# Patient Record
Sex: Male | Born: 1945 | Race: Black or African American | Hispanic: No | Marital: Married | Smoking: Former smoker
Health system: Southern US, Community
[De-identification: ages and names within clinical notes are randomized; demographics above are authoritative.]

## PROBLEM LIST (undated history)

## (undated) DIAGNOSIS — F4310 Post-traumatic stress disorder, unspecified: Secondary | ICD-10-CM

## (undated) DIAGNOSIS — I10 Essential (primary) hypertension: Secondary | ICD-10-CM

## (undated) DIAGNOSIS — E039 Hypothyroidism, unspecified: Secondary | ICD-10-CM

## (undated) DIAGNOSIS — C801 Malignant (primary) neoplasm, unspecified: Secondary | ICD-10-CM

## (undated) DIAGNOSIS — F319 Bipolar disorder, unspecified: Secondary | ICD-10-CM

## (undated) HISTORY — PX: GALLBLADDER SURGERY: SHX652

---

## 1998-11-09 ENCOUNTER — Ambulatory Visit (HOSPITAL_COMMUNITY): Admission: RE | Admit: 1998-11-09 | Discharge: 1998-11-09 | Payer: Self-pay | Admitting: Gastroenterology

## 2010-10-19 ENCOUNTER — Emergency Department (HOSPITAL_COMMUNITY): Admission: EM | Admit: 2010-10-19 | Discharge: 2010-10-19 | Payer: Self-pay | Admitting: Emergency Medicine

## 2010-11-05 ENCOUNTER — Emergency Department (HOSPITAL_COMMUNITY)
Admission: EM | Admit: 2010-11-05 | Discharge: 2010-11-05 | Payer: Self-pay | Source: Home / Self Care | Admitting: Emergency Medicine

## 2012-04-04 ENCOUNTER — Emergency Department (HOSPITAL_COMMUNITY)
Admission: EM | Admit: 2012-04-04 | Discharge: 2012-04-04 | Disposition: A | Discharge status: Home / Self Care | Payer: Medicare Other | Attending: Emergency Medicine | Admitting: Emergency Medicine

## 2012-04-04 ENCOUNTER — Encounter (HOSPITAL_COMMUNITY): Payer: Self-pay | Admitting: *Deleted

## 2012-04-04 DIAGNOSIS — R319 Hematuria, unspecified: Secondary | ICD-10-CM | POA: Insufficient documentation

## 2012-04-04 DIAGNOSIS — R300 Dysuria: Secondary | ICD-10-CM | POA: Insufficient documentation

## 2012-04-04 DIAGNOSIS — N390 Urinary tract infection, site not specified: Secondary | ICD-10-CM | POA: Insufficient documentation

## 2012-04-04 LAB — URINALYSIS, ROUTINE W REFLEX MICROSCOPIC
Ketones, ur: 40 mg/dL — AB
Nitrite: POSITIVE — AB
Protein, ur: >300 mg/dL — AB
pH: 5 (ref 5.0–8.0)

## 2012-04-04 LAB — DIFFERENTIAL
Basophils Absolute: 0.1 10*3/uL (ref 0.0–0.1)
Eosinophils Absolute: 0.2 10*3/uL (ref 0.0–0.7)
Lymphocytes Relative: 37 % (ref 12–46)
Neutrophils Relative %: 53 % (ref 43–77)

## 2012-04-04 LAB — POCT I-STAT, CHEM 8
Creatinine, Ser: 1.1 mg/dL (ref 0.50–1.35)
Glucose, Bld: 90 mg/dL (ref 70–99)
Hemoglobin: 15 g/dL (ref 13.0–17.0)
TCO2: 28 mmol/L (ref 0–100)

## 2012-04-04 LAB — CBC
Hemoglobin: 12.9 g/dL — ABNORMAL LOW (ref 13.0–17.0)
MCHC: 33.5 g/dL (ref 30.0–36.0)
RDW: 14.7 % (ref 11.5–15.5)

## 2012-04-04 LAB — URINE MICROSCOPIC-ADD ON

## 2012-04-04 MED ORDER — DEXTROSE 5 % IV SOLN
1.0000 g | Freq: Once | INTRAVENOUS | Status: AC
  Administered 2012-04-04: 1 g via INTRAVENOUS
  Filled 2012-04-04: qty 10

## 2012-04-04 MED ORDER — CEPHALEXIN 500 MG PO CAPS: 500.0000 mg | ORAL_CAPSULE | Freq: Four times a day (QID) | ORAL | Status: AC

## 2012-04-04 NOTE — ED Notes (Signed)
Patient states that he was urinating and noticed blood in his urine.  He only noticed it once.  Patient denies any painful urination.

## 2012-04-04 NOTE — Discharge Instructions (Signed)
Hematuria Hematuria is the presence of blood in the urine. This condition can result from many problems. Some of these are:   Urinary infections.   Injuries.   Kidney stones.   Drug reactions.   Tumors.   Other diseases.  The treatment depends on the diagnosis. Further studies may be needed to find the cause even if the hematuria subsides on its own. An exam by an urologist may also be indicated. If you are bleeding heavily, or have prostate problems, clots can form in the bladder and block the passage of urine. This requires emergency treatment with a catheter and bladder irrigation.  Contact your doctor for follow-up care within the next 2-4 days. SEEK IMMEDIATE MEDICAL CARE IF: You develop a fever, abdominal pain, urinary blockage, vomiting, or any other serious problems. Document Released: 12/28/2004 Document Revised: 11/09/2011 Document Reviewed: 10/02/2008 ExitCare Patient Information 2012 [employer].Urinary Tract Infection Infections of the urinary tract can start in several places. A bladder infection (cystitis), a kidney infection (pyelonephritis), and a prostate infection (prostatitis) are different types of urinary tract infections (UTIs). They usually get better if treated with medicines (antibiotics) that kill germs. Take all the medicine until it is gone. You or your child may feel better in a few days, but TAKE ALL MEDICINE or the infection may not respond and may become more difficult to treat. HOME CARE INSTRUCTIONS   Drink enough water and fluids to keep the urine clear or pale yellow. Cranberry juice is especially recommended, in addition to large amounts of water.   Avoid caffeine, tea, and carbonated beverages. They tend to irritate the bladder.   Alcohol may irritate the prostate.   Only take over-the-counter or prescription medicines for pain, discomfort, or fever as directed by your caregiver.  To prevent further infections:  Empty the bladder often.  Avoid holding urine for long periods of time.   After a bowel movement, women should cleanse from front to back. Use each tissue only once.   Empty the bladder before and after sexual intercourse.  FINDING OUT THE RESULTS OF YOUR TEST Not all test results are available during your visit. If your or your child's test results are not back during the visit, make an appointment with your caregiver to find out the results. Do not assume everything is normal if you have not heard from your caregiver or the medical facility. It is important for you to follow up on all test results. SEEK MEDICAL CARE IF:   There is back pain.   Your baby is older than 3 months with a rectal temperature of 100.5 F (38.1 C) or higher for more than 1 day.   Your or your child's problems (symptoms) are no better in 3 days. Return sooner if you or your child is getting worse.  SEEK IMMEDIATE MEDICAL CARE IF:   There is severe back pain or lower abdominal pain.   You or your child develops chills.   You have a fever.   Your baby is older than 3 months with a rectal temperature of 102 F (38.9 C) or higher.   Your baby is 3 months old or younger with a rectal temperature of 100.4 F (38 C) or higher.   There is nausea or vomiting.   There is continued burning or discomfort with urination.  MAKE SURE YOU:   Understand these instructions.   Will watch your condition.   Will get help right away if you are not doing well or get worse.    Document Released: 08/30/2005 Document Revised: 11/09/2011 Document Reviewed: 04/04/2007 ExitCare Patient Information 2012 [employer]. 

## 2012-04-04 NOTE — ED Provider Notes (Signed)
History     CSN: [id number]  Arrival date & time 04/04/12  1918   First MD Initiated Contact with Patient 04/04/12 2040      Chief Complaint  Patient presents with  . Hematuria    (Consider location/radiation/quality/duration/timing/severity/associated sxs/prior treatment) Patient is a 66 y.o. male presenting with hematuria. The history is provided by the patient.  Hematuria This is a new problem. The current episode started today. The problem is unchanged. He describes the hematuria as gross hematuria. The hematuria occurs throughout his entire urinary stream. He reports no clotting in his urine stream. His pain is at a severity of 2/10. The pain is mild. He describes his urine color as dark red. Irritative symptoms do not include frequency or urgency. Obstructive symptoms include incomplete emptying. Obstructive symptoms do not include dribbling. Associated symptoms include dysuria. Pertinent negatives include no abdominal pain, flank pain, genital pain, nausea or vomiting. His sexual activity is non-contributory to the current illness. His past medical history is significant for BPH. There is no history of GU trauma, kidney stones, prostatitis or recent infection.    History reviewed. No pertinent past medical history.  History reviewed. No pertinent past surgical history.  History reviewed. No pertinent family history.  History  Substance Use Topics  . Smoking status: Current Everyday Smoker -- 0.5 packs/day    Types: Cigarettes  . Smokeless tobacco: Not on file  . Alcohol Use: Yes      Review of Systems  Gastrointestinal: Negative for nausea, vomiting and abdominal pain.  Genitourinary: Positive for dysuria, hematuria and incomplete emptying. Negative for urgency, frequency and flank pain.  All other systems reviewed and are negative.    Allergies  Review of patient's allergies indicates no known allergies.  Home Medications  No current outpatient prescriptions on  file.  BP 162/79  Pulse 50  Temp(Src) 98.8 F (37.1 C) (Oral)  Resp 18  SpO2 98%  Physical Exam  Nursing note and vitals reviewed. Constitutional: He is oriented to person, place, and time. He appears well-developed and well-nourished. No distress.  HENT:  Head: Normocephalic and atraumatic.  Mouth/Throat: Oropharynx is clear and moist.  Eyes: Conjunctivae and EOM are normal. Pupils are equal, round, and reactive to light.  Neck: Normal range of motion. Neck supple.  Cardiovascular: Normal rate, regular rhythm and intact distal pulses.   No murmur heard. Pulmonary/Chest: Effort normal and breath sounds normal. No respiratory distress. He has no wheezes. He has no rales.  Abdominal: Soft. He exhibits no distension. There is no tenderness. There is no rebound and no guarding.  Genitourinary:       Small amt of blood at the meatus  Musculoskeletal: Normal range of motion. He exhibits no edema and no tenderness.  Neurological: He is alert and oriented to person, place, and time.  Skin: Skin is warm and dry. No rash noted. No erythema.  Psychiatric: He has a normal mood and affect. His behavior is normal.    ED Course  Procedures (including critical care time)  Labs Reviewed  URINALYSIS, ROUTINE W REFLEX MICROSCOPIC - Abnormal; Notable for the following:    Color, Urine RED (*) BIOCHEMICALS MAY BE AFFECTED BY COLOR   APPearance TURBID (*)    Glucose, UA 100 (*)    Hgb urine dipstick LARGE (*)    Bilirubin Urine LARGE (*)    Ketones, ur 40 (*)    Protein, ur >300 (*)    Nitrite POSITIVE (*)    Leukocytes, UA LARGE (*)  All other components within normal limits  CBC - Abnormal; Notable for the following:    Hemoglobin 12.9 (*)    HCT 38.5 (*)    MCV 73.3 (*)    MCH 24.6 (*)    All other components within normal limits  URINE MICROSCOPIC-ADD ON - Abnormal; Notable for the following:    Bacteria, UA MANY (*)    All other components within normal limits  DIFFERENTIAL    POCT I-STAT, CHEM 8   No results found.   No diagnosis found.    MDM   Patient presenting with a history of dysuria and hematuria that started today. Patient has no pain and is otherwise well appearing. He denies any other symptoms suggestive of pyelonephritis, kidney stone. He denies any symptoms concerning for anemia.  Vital signs are within normal limits. patient's bladder was irrigated and after 1 L of fluid patient's urine was light pink. There is no blood clots present. UA showed urinary tract infection with a stable hemoglobin and normal creatinine. Patient given antibiotics and the culture was sent. He will follow up with his doctor as an outpatient or return if symptoms worsen.        Gwyneth Sprout, MD 04/04/12 2214

## 2013-12-18 ENCOUNTER — Encounter (HOSPITAL_COMMUNITY): Payer: Self-pay | Admitting: Emergency Medicine

## 2013-12-18 ENCOUNTER — Emergency Department (HOSPITAL_COMMUNITY): Payer: Medicare Other

## 2013-12-18 ENCOUNTER — Emergency Department (HOSPITAL_COMMUNITY)
Admission: EM | Admit: 2013-12-18 | Discharge: 2013-12-19 | Disposition: A | Discharge status: Home / Self Care | Payer: Medicare Other | Attending: Emergency Medicine | Admitting: Emergency Medicine

## 2013-12-18 DIAGNOSIS — N390 Urinary tract infection, site not specified: Secondary | ICD-10-CM | POA: Insufficient documentation

## 2013-12-18 DIAGNOSIS — N3289 Other specified disorders of bladder: Secondary | ICD-10-CM | POA: Insufficient documentation

## 2013-12-18 DIAGNOSIS — E119 Type 2 diabetes mellitus without complications: Secondary | ICD-10-CM | POA: Insufficient documentation

## 2013-12-18 DIAGNOSIS — F17200 Nicotine dependence, unspecified, uncomplicated: Secondary | ICD-10-CM | POA: Insufficient documentation

## 2013-12-18 DIAGNOSIS — Z79899 Other long term (current) drug therapy: Secondary | ICD-10-CM | POA: Insufficient documentation

## 2013-12-18 LAB — CBC WITH DIFFERENTIAL/PLATELET
BASOS PCT: 0 % (ref 0–1)
Basophils Absolute: 0 10*3/uL (ref 0.0–0.1)
EOS ABS: 0.2 10*3/uL (ref 0.0–0.7)
EOS PCT: 3 % (ref 0–5)
HCT: 30.6 % — ABNORMAL LOW (ref 39.0–52.0)
HEMOGLOBIN: 10.5 g/dL — AB (ref 13.0–17.0)
LYMPHS ABS: 2.3 10*3/uL (ref 0.7–4.0)
Lymphocytes Relative: 36 % (ref 12–46)
MCH: 24.2 pg — AB (ref 26.0–34.0)
MCHC: 34.3 g/dL (ref 30.0–36.0)
MCV: 70.5 fL — AB (ref 78.0–100.0)
MONO ABS: 0.4 10*3/uL (ref 0.1–1.0)
Monocytes Relative: 6 % (ref 3–12)
Neutro Abs: 3.5 10*3/uL (ref 1.7–7.7)
Neutrophils Relative %: 55 % (ref 43–77)
PLATELETS: 144 10*3/uL — AB (ref 150–400)
RBC: 4.34 MIL/uL (ref 4.22–5.81)
RDW: 13.9 % (ref 11.5–15.5)
WBC: 6.4 10*3/uL (ref 4.0–10.5)

## 2013-12-18 LAB — URINE MICROSCOPIC-ADD ON

## 2013-12-18 LAB — URINALYSIS, ROUTINE W REFLEX MICROSCOPIC
GLUCOSE, UA: 100 mg/dL — AB
KETONES UR: 40 mg/dL — AB
NITRITE: POSITIVE — AB
PH: 5.5 (ref 5.0–8.0)
Protein, ur: >300 mg/dL — AB
Specific Gravity, Urine: 1.031 — ABNORMAL HIGH (ref 1.005–1.030)
Urobilinogen, UA: 2 mg/dL — ABNORMAL HIGH (ref 0.0–1.0)

## 2013-12-18 LAB — BASIC METABOLIC PANEL
BUN: 15 mg/dL (ref 6–23)
CALCIUM: 8.8 mg/dL (ref 8.4–10.5)
CO2: 22 meq/L (ref 19–32)
CREATININE: 0.88 mg/dL (ref 0.50–1.35)
Chloride: 101 mEq/L (ref 96–112)
GFR calc Af Amer: >90 mL/min (ref >90)
GFR, EST NON AFRICAN AMERICAN: 88 mL/min — AB (ref >90)
GLUCOSE: 78 mg/dL (ref 70–99)
Potassium: 4 mEq/L (ref 3.7–5.3)
Sodium: 137 mEq/L (ref 137–147)

## 2013-12-18 MED ORDER — SULFAMETHOXAZOLE-TMP DS 800-160 MG PO TABS
1.0000 | ORAL_TABLET | Freq: Once | ORAL | Status: AC
  Administered 2013-12-18: 1 via ORAL
  Filled 2013-12-18: qty 1

## 2013-12-18 NOTE — ED Notes (Signed)
Foley irrigated with 1000 ml of NS. Output was originally dark red, after 1000 ml output is now a clear pink with occasional clots. 50 mls of output originally when Foley inserted. 1000 mls of output from irrigation. Pt denied cramping or pain.

## 2013-12-18 NOTE — ED Notes (Signed)
Pt stats he has been having blood in his urine. Pt denies kidney stones. Pt states last time he was here (unsure of date) he was told he has a kidney infection.

## 2013-12-18 NOTE — ED Provider Notes (Signed)
CSN: [id number]     Arrival date & time 12/18/13  1548 History   First MD Initiated Contact with Patient 12/18/13 2056     Chief Complaint  Patient presents with  . Hematuria   (Consider location/radiation/quality/duration/timing/severity/associated sxs/prior Treatment) HPI Comments: Patient is a 68 year old male with a past medical history of diabetes who presents with hematuria that he noticed since last night. He notices the hematuria every time he urinates. Patient reports some associated left sided back pain when he urinates. The pain is aching and moderate and does not radiate. Patient reports last time this happened, he had a UTI. Patient denies any other associated symptoms. No aggravating/alleviating factors.    History reviewed. No pertinent past medical history. History reviewed. No pertinent past surgical history. History reviewed. No pertinent family history. History  Substance Use Topics  . Smoking status: Current Every Day Smoker -- 0.50 packs/day    Types: Cigarettes  . Smokeless tobacco: Not on file  . Alcohol Use: Yes    Review of Systems  Constitutional: Negative for fever, chills and fatigue.  HENT: Negative for trouble swallowing.   Eyes: Negative for visual disturbance.  Respiratory: Negative for shortness of breath.   Cardiovascular: Negative for chest pain and palpitations.  Gastrointestinal: Negative for nausea, vomiting, abdominal pain and diarrhea.  Genitourinary: Positive for hematuria. Negative for dysuria and difficulty urinating.  Musculoskeletal: Positive for back pain. Negative for arthralgias and neck pain.  Skin: Negative for color change.  Neurological: Negative for dizziness and weakness.  Psychiatric/Behavioral: Negative for dysphoric mood.    Allergies  Review of patient's allergies indicates no known allergies.  Home Medications   Current Outpatient Rx  Name  Route  Sig  Dispense  Refill  . LEVOTHYROXINE SODIUM PO   Oral   Take 1  tablet by mouth daily.         Marland Kitchen MELATONIN PO   Oral   Take 1 tablet by mouth at bedtime.          BP 138/79  Pulse 54  Temp(Src) 98.4 F (36.9 C) (Oral)  Resp 18  SpO2 98% Physical Exam  Nursing note and vitals reviewed. Constitutional: He is oriented to person, place, and time. He appears well-developed and well-nourished. No distress.  HENT:  Head: Normocephalic and atraumatic.  Eyes: Conjunctivae are normal.  Neck: Normal range of motion.  Cardiovascular: Normal rate and regular rhythm.  Exam reveals no gallop and no friction rub.   No murmur heard. Pulmonary/Chest: Effort normal and breath sounds normal. He has no wheezes. He has no rales. He exhibits no tenderness.  Abdominal: Soft. He exhibits no distension. There is no tenderness. There is no rebound.  Genitourinary:  No CVA tenderness to palpation.   Musculoskeletal: Normal range of motion.  Neurological: He is alert and oriented to person, place, and time. Coordination normal.  Speech is goal-oriented. Moves limbs without ataxia.   Skin: Skin is warm and dry.  Psychiatric: He has a normal mood and affect. His behavior is normal.    ED Course  Procedures (including critical care time) Labs Review Labs Reviewed  URINALYSIS, ROUTINE W REFLEX MICROSCOPIC - Abnormal; Notable for the following:    Color, Urine RED (*)    APPearance TURBID (*)    Specific Gravity, Urine 1.031 (*)    Glucose, UA 100 (*)    Hgb urine dipstick LARGE (*)    Bilirubin Urine LARGE (*)    Ketones, ur 40 (*)  Protein, ur >300 (*)    Urobilinogen, UA 2.0 (*)    Nitrite POSITIVE (*)    Leukocytes, UA LARGE (*)    All other components within normal limits  URINE MICROSCOPIC-ADD ON - Abnormal; Notable for the following:    Squamous Epithelial / LPF FEW (*)    All other components within normal limits  CBC WITH DIFFERENTIAL - Abnormal; Notable for the following:    Hemoglobin 10.5 (*)    HCT 30.6 (*)    MCV 70.5 (*)    MCH 24.2  (*)    Platelets 144 (*)    All other components within normal limits  BASIC METABOLIC PANEL - Abnormal; Notable for the following:    GFR calc non Af Amer 88 (*)    All other components within normal limits   Imaging Review Ct Abdomen Pelvis Wo Contrast  12/19/2013   CLINICAL DATA:  Hematuria. Left-sided low back pain during urination.  EXAM: CT ABDOMEN AND PELVIS WITHOUT CONTRAST  TECHNIQUE: Multidetector CT imaging of the abdomen and pelvis was performed following the standard protocol without intravenous contrast.  COMPARISON:  None.  FINDINGS: Large blood clot in the urinary bladder which is decompressed by Foley catheter. The Foley catheter accounts for the gas in the bladder. No discrete bladder mass identified, though the clock could certainly obscure a mass on the right side of the bladder. Moderate prostate gland enlargement, particularly the median lobe. Seminal vesicles normal.  No evidence of urinary tract calculi or obstruction on either side. Cortical calcifications involving the medial left kidney. Coarse calcifications involving a 1.9 x 3.2 x 2.4 cm cyst in the lower pole of the right kidney. 1.2 cm simple cyst involving the mid left kidney. Within the limits of the unenhanced technique, no solid renal masses.  Normal unenhanced appearance of the liver, spleen, pancreas, adrenal glands, and gallbladder. No biliary ductal dilation. 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  Stomach decompressed and unremarkable. Normal-appearing small bowel. Moderate stool burden throughout the normal appearing colon. Appendix not visualized, but no pericecal inflammation. No ascites.  Bone window images demonstrate mild degenerative disc disease at L4-5, mild facet degenerative changes involving the lower lumbar spine. Visualized lung bases clear apart from the expected dependent atelectasis posteriorly. Heart size upper normal.  IMPRESSION: 1. Large blood clot within the urinary  bladder. While a discrete bladder mass is not identified, the clot could certainly obscure a mass involving the right side of the bladder. 2. No evidence of urinary tract calculi. Or obstruction on either side. 3. Cortical calcifications involving the medial left kidney. 4. Coarse calcifications involving the wall of a cyst in the lower pole of the right kidney.   Electronically Signed   By: Evangeline Dakin M.D.   On: 12/19/2013 00:16    EKG Interpretation   None       MDM   1. UTI (urinary tract infection)   2. Blood clot in bladder     10:42 PM Labs pending. Urinalysis shows UTI. Patient will have CT abdomen pelvis to rule out kidney stone. Vitals stable and patient afebrile. Patient will have bactrim for UTI.   12:40 AM Patient has blood clot in bladder shown on CT scan. Patient will have bladder irrigated again and be discharged with bactrim for UTI. Patient will have close follow up with Urology. Vitals stable and patient afebrile.     Alvina Chou, [place] 12/19/13 [phone]

## 2013-12-18 NOTE — ED Notes (Signed)
Pt to CT. When patient returns Mingo Amber, MD stated to continue to irrigate until light pink or clear.

## 2013-12-18 NOTE — ED Notes (Signed)
Pt reports blood in urine since last night, having left side lower back pain when he urinates. Ambulatory, no acute distress noted.

## 2013-12-19 MED ORDER — CYCLOBENZAPRINE HCL 10 MG PO TABS: 10.0000 mg | ORAL_TABLET | Freq: Two times a day (BID) | ORAL | Status: DC | PRN

## 2013-12-19 MED ORDER — SULFAMETHOXAZOLE-TRIMETHOPRIM 800-160 MG PO TABS: 1.0000 | ORAL_TABLET | Freq: Two times a day (BID) | ORAL | Status: DC

## 2013-12-19 NOTE — Discharge Instructions (Signed)
Follow up with [facility] as soon as possible. Take Bactrim as directed until gone. Take Flexeril as needed for back pain. Return to the ED with worsening or concerning symptoms. Refer to attached documents for more information.

## 2013-12-19 NOTE — ED Provider Notes (Signed)
Medical screening examination/treatment/procedure(s) were conducted as a shared visit with non-physician practitioner(s) and myself.  I personally evaluated the patient during the encounter.  EKG Interpretation   None        Patient here with hematuria. UA shows UTI. Had this once previously, did not f/u with Urology. Belly soft, non-distended. Will CT as he had this once before. Foley irrigated - clots and blood removed. Irrigated again after CT scan. CT shows clots in bladder, unable to determine if mass present. Given Urology f/u.  Osvaldo Shipper, MD 12/19/13 [phone]

## 2013-12-19 NOTE — ED Notes (Signed)
Irrigated Foley with 250cc with not change in color of output; still bright red with some blood clots; will Irrigate again in about 10-15 mins

## 2014-05-08 ENCOUNTER — Encounter (HOSPITAL_COMMUNITY): Payer: Self-pay | Admitting: Emergency Medicine

## 2014-05-08 ENCOUNTER — Emergency Department (HOSPITAL_COMMUNITY)
Admission: EM | Admit: 2014-05-08 | Discharge: 2014-05-08 | Disposition: A | Discharge status: Home / Self Care | Payer: Medicare Other | Attending: Emergency Medicine | Admitting: Emergency Medicine

## 2014-05-08 ENCOUNTER — Emergency Department (HOSPITAL_COMMUNITY): Payer: Medicare Other

## 2014-05-08 DIAGNOSIS — Z79899 Other long term (current) drug therapy: Secondary | ICD-10-CM | POA: Insufficient documentation

## 2014-05-08 DIAGNOSIS — R091 Pleurisy: Secondary | ICD-10-CM | POA: Insufficient documentation

## 2014-05-08 DIAGNOSIS — F17200 Nicotine dependence, unspecified, uncomplicated: Secondary | ICD-10-CM | POA: Insufficient documentation

## 2014-05-08 DIAGNOSIS — Z7982 Long term (current) use of aspirin: Secondary | ICD-10-CM | POA: Insufficient documentation

## 2014-05-08 LAB — I-STAT TROPONIN, ED: TROPONIN I, POC: 0 ng/mL (ref 0.00–0.08)

## 2014-05-08 LAB — BASIC METABOLIC PANEL
BUN: 16 mg/dL (ref 6–23)
CALCIUM: 9.2 mg/dL (ref 8.4–10.5)
CO2: 25 meq/L (ref 19–32)
Chloride: 104 mEq/L (ref 96–112)
Creatinine, Ser: 1.1 mg/dL (ref 0.50–1.35)
GFR calc Af Amer: 79 mL/min — ABNORMAL LOW (ref >90)
GFR calc non Af Amer: 68 mL/min — ABNORMAL LOW (ref >90)
Glucose, Bld: 96 mg/dL (ref 70–99)
POTASSIUM: 3.8 meq/L (ref 3.7–5.3)
SODIUM: 142 meq/L (ref 137–147)

## 2014-05-08 LAB — CBC
HCT: 37.4 % — ABNORMAL LOW (ref 39.0–52.0)
Hemoglobin: 12.6 g/dL — ABNORMAL LOW (ref 13.0–17.0)
MCH: 22.4 pg — ABNORMAL LOW (ref 26.0–34.0)
MCHC: 33.7 g/dL (ref 30.0–36.0)
MCV: 66.4 fL — ABNORMAL LOW (ref 78.0–100.0)
PLATELETS: 190 10*3/uL (ref 150–400)
RBC: 5.63 MIL/uL (ref 4.22–5.81)
RDW: 16.7 % — ABNORMAL HIGH (ref 11.5–15.5)
WBC: 6.7 10*3/uL (ref 4.0–10.5)

## 2014-05-08 LAB — PRO B NATRIURETIC PEPTIDE: PRO B NATRI PEPTIDE: 109.3 pg/mL (ref 0–125)

## 2014-05-08 MED ORDER — HYDROCODONE-ACETAMINOPHEN 5-325 MG PO TABS: 1.0000 | ORAL_TABLET | ORAL | Status: DC | PRN

## 2014-05-08 MED ORDER — SODIUM CHLORIDE 0.9 % IV SOLN
1000.0000 mL | INTRAVENOUS | Status: DC
  Administered 2014-05-08: 1000 mL via INTRAVENOUS

## 2014-05-08 MED ORDER — NAPROXEN 500 MG PO TABS: 500.0000 mg | ORAL_TABLET | Freq: Two times a day (BID) | ORAL | Status: DC

## 2014-05-08 MED ORDER — ASPIRIN 81 MG PO CHEW
324.0000 mg | CHEWABLE_TABLET | Freq: Once | ORAL | Status: AC
  Administered 2014-05-08: 324 mg via ORAL
  Filled 2014-05-08: qty 4

## 2014-05-08 MED ORDER — HYDROCODONE-ACETAMINOPHEN 5-325 MG PO TABS
1.0000 | ORAL_TABLET | Freq: Once | ORAL | Status: AC
  Administered 2014-05-08: 1 via ORAL
  Filled 2014-05-08: qty 1

## 2014-05-08 NOTE — ED Provider Notes (Signed)
CSN: [id number]     Arrival date & time 05/08/14  1445 History   First MD Initiated Contact with Patient 05/08/14 1459     Chief Complaint  Patient presents with  . Chest Pain  . Shortness of Breath   HPI Patient presents to the emergency room with complaints of left-sided chest pain. The symptoms started yesterday. It is sharp pain in the left side of his chest it shoots towards his back. Pain seems to come and go with breathing. The episodes only last a few seconds at a time. He denies feeling short of breath to me. He denies fever or significant coughing. He denies any leg swelling. Denies any abdominal pain, vomiting or diarrhea. Patient denies any history of heart or lung disease. He does smoke cigarettes.  History reviewed. No pertinent past medical history. History reviewed. No pertinent past surgical history. History reviewed. No pertinent family history. History  Substance Use Topics  . Smoking status: Current Every Day Smoker -- 0.50 packs/day    Types: Cigarettes  . Smokeless tobacco: Not on file  . Alcohol Use: Yes    Review of Systems  All other systems reviewed and are negative.     Allergies  Review of patient's allergies indicates no known allergies.  Home Medications   Prior to Admission medications   Medication Sig Start Date End Date Taking? Authorizing Provider  aspirin EC 81 MG tablet Take 81 mg by mouth daily.   Yes Historical Provider, MD  LEVOTHYROXINE SODIUM PO Take 1 tablet by mouth daily.   Yes Historical Provider, MD  HYDROcodone-acetaminophen (NORCO/VICODIN) 5-325 MG per tablet Take 1-2 tablets by mouth every 4 (four) hours as needed. 05/08/14   Dorie Rank, MD  MELATONIN PO Take 1 tablet by mouth at bedtime.    Historical Provider, MD  naproxen (NAPROSYN) 500 MG tablet Take 1 tablet (500 mg total) by mouth 2 (two) times daily with a meal. As needed for pain 05/08/14   Dorie Rank, MD   BP 134/67  Pulse 51  Temp(Src) 98.6 F (37 C) (Oral)  Resp 16   SpO2 97% Physical Exam  Nursing note and vitals reviewed. Constitutional: He appears well-developed and well-nourished. No distress.  HENT:  Head: Normocephalic and atraumatic.  Right Ear: External ear normal.  Left Ear: External ear normal.  Eyes: Conjunctivae are normal. Right eye exhibits no discharge. Left eye exhibits no discharge. No scleral icterus.  Neck: Neck supple. No tracheal deviation present.  Cardiovascular: Normal rate, regular rhythm and intact distal pulses.   Pulmonary/Chest: Effort normal and breath sounds normal. No stridor. No respiratory distress. He has no wheezes. He has no rales. He exhibits tenderness.  Abdominal: Soft. Bowel sounds are normal. He exhibits no distension. There is no tenderness. There is no rebound and no guarding.  Musculoskeletal: He exhibits no edema and no tenderness.  Neurological: He is alert. He has normal strength. No cranial nerve deficit (no facial droop, extraocular movements intact, no slurred speech) or sensory deficit. He exhibits normal muscle tone. He displays no seizure activity. Coordination normal.  Skin: Skin is warm and dry. No rash noted. He is not diaphoretic.  Psychiatric: He has a normal mood and affect.    ED Course  Procedures (including critical care time) Labs Review Labs Reviewed  CBC - Abnormal; Notable for the following:    Hemoglobin 12.6 (*)    HCT 37.4 (*)    MCV 66.4 (*)    MCH 22.4 (*)  RDW 16.7 (*)    All other components within normal limits  BASIC METABOLIC PANEL - Abnormal; Notable for the following:    GFR calc non Af Amer 68 (*)    GFR calc Af Amer 79 (*)    All other components within normal limits  PRO B NATRIURETIC PEPTIDE  I-STAT TROPOININ, ED  I-STAT TROPOININ, ED    Imaging Review Dg Chest 2 View (if Patient Has Fever And/or Copd)  05/08/2014   CLINICAL DATA:  CHEST PAIN SHORTNESS OF BREATH  EXAM: CHEST  2 VIEW  COMPARISON:  Rib series dated 10/19/2010  FINDINGS: Lungs are  hyperinflated, with mild flattening of the hemidiaphragms. Cardiac silhouette and mediastinal contours are unremarkable. The lungs are clear. No acute osseous abnormalities.  IMPRESSION: No active cardiopulmonary disease.  COPD changes.   Electronically Signed   By: Margaree Mackintosh M.D.   On: 05/08/2014 15:57     EKG Interpretation   Date/Time:  Friday May 08 2014 14:51:00 EDT Ventricular Rate:  67 PR Interval:  160 QRS Duration: 84 QT Interval:  418 QTC Calculation: 441 R Axis:   68 Text Interpretation:  Sinus rhythm Probable left atrial enlargement No  previous tracing Confirmed by Emily Forse  MD-J, Verlisa Vara ([id number]) on 05/08/2014 3:07:21  PM      MDM   Final diagnoses:  Pleurisy    The patient describes chest pain occurs with deep breathing since yesterday. Nursing notes indicate the patient was feeling short of breath however the patient denied feeling short of breath when I asked him about it. He is not tachycardic. Not tachypneic. His laboratory testing and evaluation are unremarkable. I doubt acute coronary syndrome. Patient has no PE risk factors. He is low risk. I doubt pulmonary embolus.  At this time there does not appear to be any evidence of an acute emergency medical condition and the patient appears stable for discharge with appropriate outpatient follow up.   Patient was instructed to return to emergency room for worsening symptoms or any other concern    Dorie Rank, MD 05/08/14 1746

## 2014-05-08 NOTE — ED Notes (Signed)
Pt c/o central/left side chest pressure and SOB starting last night.  Pain score 8/10.  Sts pain is radiating into back.  Hx of smoking.  Pt reports having this pain in the past and diagnosed w/ PNA.

## 2014-05-08 NOTE — Discharge Instructions (Signed)
Pleurisy °Pleurisy is redness, puffiness (swelling), and soreness (inflammation) of the lining of the lungs. It can be hard to breathe and hurt to breathe. Coughing or deep breathing will make it hurt more. It is often caused by an existing infection or disease.  °HOME CARE °· Only take medicine as told by your doctor. °· Only take antibiotic medicine as directed. Make sure to finish it even if you start to feel better. °GET HELP RIGHT AWAY IF:  °· Your lips, fingernails, or toenails are blue or dark. °· You cough up blood. °· You have a hard time breathing. °· Your pain is not controlled with medicine or it lasts for more than 1 week. °· Your pain spreads (radiates) into your neck, arms, or jaw. °· You are short of breath or wheezing. °· You develop a fever, rash, throw up (vomit), or faint. °MAKE SURE YOU:  °· Understand these instructions. °· Will watch your condition. °· Will get help right away if you are not doing well or get worse. °Document Released: 11/02/2008 Document Revised: 07/23/2013 Document Reviewed: 05/04/2013 °ExitCare® Patient Information ©2014 [employer]. ° °

## 2015-02-25 ENCOUNTER — Emergency Department (HOSPITAL_COMMUNITY)
Admission: EM | Admit: 2015-02-25 | Discharge: 2015-02-25 | Disposition: A | Discharge status: Home / Self Care | Payer: Medicare Other | Attending: Emergency Medicine | Admitting: Emergency Medicine

## 2015-02-25 ENCOUNTER — Encounter (HOSPITAL_COMMUNITY): Payer: Self-pay | Admitting: *Deleted

## 2015-02-25 DIAGNOSIS — R454 Irritability and anger: Secondary | ICD-10-CM

## 2015-02-25 DIAGNOSIS — Z79899 Other long term (current) drug therapy: Secondary | ICD-10-CM | POA: Diagnosis not present

## 2015-02-25 DIAGNOSIS — Z7982 Long term (current) use of aspirin: Secondary | ICD-10-CM | POA: Insufficient documentation

## 2015-02-25 DIAGNOSIS — Z8659 Personal history of other mental and behavioral disorders: Secondary | ICD-10-CM | POA: Insufficient documentation

## 2015-02-25 DIAGNOSIS — Z791 Long term (current) use of non-steroidal anti-inflammatories (NSAID): Secondary | ICD-10-CM | POA: Diagnosis not present

## 2015-02-25 DIAGNOSIS — Z630 Problems in relationship with spouse or partner: Secondary | ICD-10-CM

## 2015-02-25 DIAGNOSIS — Z87891 Personal history of nicotine dependence: Secondary | ICD-10-CM | POA: Diagnosis not present

## 2015-02-25 HISTORY — DX: Bipolar disorder, unspecified: F31.9

## 2015-02-25 HISTORY — DX: Post-traumatic stress disorder, unspecified: F43.10

## 2015-02-25 NOTE — Discharge Instructions (Signed)
Stress and Stress Management °Stress is a normal reaction to life events. It is what you feel when life demands more than you are used to or more than you can handle. Some stress can be useful. For example, the stress reaction can help you catch the last bus of the day, study for a test, or meet a deadline at work. But stress that occurs too often or for too long can cause problems. It can affect your emotional health and interfere with relationships and normal daily activities. Too much stress can weaken your immune system and increase your risk for physical illness. If you already have a medical problem, stress can make it worse. °CAUSES  °All sorts of life events may cause stress. An event that causes stress for one person may not be stressful for another person. Major life events commonly cause stress. These may be positive or negative. Examples include losing your job, moving into a new home, getting married, having a baby, or losing a loved one. Less obvious life events may also cause stress, especially if they occur day after day or in combination. Examples include working long hours, driving in traffic, caring for children, being in debt, or being in a difficult relationship. °SIGNS AND SYMPTOMS °Stress may cause emotional symptoms including, the following: °· Anxiety. This is feeling worried, afraid, on edge, overwhelmed, or out of control. °· Anger. This is feeling irritated or impatient. °· Depression. This is feeling sad, down, helpless, or guilty. °· Difficulty focusing, remembering, or making decisions. °Stress may cause physical symptoms, including the following:  °· Aches and pains. These may affect your head, neck, back, stomach, or other areas of your body. °· Tight muscles or clenched jaw. °· Low energy or trouble sleeping.  °Stress may cause unhealthy behaviors, including the following:  °· Eating to feel better (overeating) or skipping meals. °· Sleeping too little, too much, or both. °· Working  too much or putting off tasks (procrastination). °· Smoking, drinking alcohol, or using drugs to feel better. °DIAGNOSIS  °Stress is diagnosed through an assessment by your health care provider. Your health care provider will ask questions about your symptoms and any stressful life events. Your health care provider will also ask about your medical history and may order blood tests or other tests. Certain medical conditions and medicine can cause physical symptoms similar to stress.  Mental illness can cause emotional symptoms and unhealthy behaviors similar to stress. Your health care provider may refer you to a mental health professional for further evaluation.  °TREATMENT  °Stress management is the recommended treatment for stress. The goals of stress management are reducing stressful life events and coping with stress in healthy ways.  °Techniques for reducing stressful life events include the following: °· Stress identification. Self-monitor for stress and identify what causes stress for you. These skills may help you to avoid some stressful events. °· Time management. Set your priorities, keep a calendar of events, and learn to say "no." These tools can help you avoid making too many commitments. °Techniques for coping with stress include the following: °· Rethinking the problem. Try to think realistically about stressful events rather than ignoring them or overreacting. Try to find the positives in a stressful situation rather than focusing on the negatives. °· Exercise. Physical exercise can release both physical and emotional tension. The key is to find a form of exercise you enjoy and do it regularly. °· Relaxation techniques. These relax the body and mind. Examples include yoga, meditation, tai chi, biofeedback, deep   breathing, progressive muscle relaxation, listening to music, being out in nature, journaling, and other hobbies. Again, the key is to find one or more that you enjoy and can do  regularly.  Healthy lifestyle. Eat a balanced diet, get plenty of sleep, and do not smoke. Avoid using alcohol or drugs to relax.  Strong support network. Spend time with family, friends, or other people you enjoy being around.Express your feelings and talk things over with someone you trust. Counseling or talktherapy with a mental health professional may be helpful if you are having difficulty managing stress on your own. Medicine is typically not recommended for the treatment of stress.Talk to your health care provider if you think you need medicine for symptoms of stress. HOME CARE INSTRUCTIONS  Keep all follow-up visits as directed by your health care provider.  Take all medicines as directed by your health care provider. SEEK MEDICAL CARE IF:  Your symptoms get worse or you start having new symptoms.  You feel overwhelmed by your problems and can no longer manage them on your own. SEEK IMMEDIATE MEDICAL CARE IF:  You feel like hurting yourself or someone else. Document Released: 05/16/2001 Document Revised: 04/06/2014 Document Reviewed: 07/15/2013 [facility] Patient Information 2015 [place], [place]. This information is not intended to replace advice given to you by your health care provider. Make sure you discuss any questions you have with your health care provider.  Anger Management Anger is a normal human emotion. However, anger can range from mild irritation to rage. When your anger becomes harmful to yourself or others, it is unhealthy anger.  CAUSES  There are many reasons for unhealthy anger. Many people learn how to express anger from observing how their family expressed anger. In troubled, chaotic, or abusive families, anger can be expressed as rage or even violence. Children can grow up never learning how healthy anger can be expressed. Factors that contribute to unhealthy anger include:   Drug or alcohol abuse.  Post-traumatic stress disorder.  Traumatic brain  injury. COMPLICATIONS  People with unhealthy anger tend to overreact and retaliate against a real or imagined threat. The need to retaliate can turn into violence or verbal abuse against another person. Chronic anger can lead to health problems, such as hypertension, high blood pressure, and depression. TREATMENT  Exercising, relaxing, meditating, or writing out your feelings all can be beneficial in managing moderate anger. For unhealthy anger, the following methods may be used:  Cognitive-behavioral counseling (learning skills to change the thoughts that influence your mood).  Relaxation training.  Interpersonal counseling.  Assertive communication skills.  Medication. Document Released: 09/17/2007 Document Revised: 02/12/2012 Document Reviewed: 01/26/2011 [facility] Patient Information 2015 [place], [place]. This information is not intended to replace advice given to you by your health care provider. Make sure you discuss any questions you have with your health care provider.   Emergency Department Resource Guide 1) Find a Doctor and Pay Out of Pocket Although you won't have to find out who is covered by your insurance plan, it is a good idea to ask around and get recommendations. You will then need to call the office and see if the doctor you have chosen will accept you as a new patient and what types of options they offer for patients who are self-pay. Some doctors offer discounts or will set up payment plans for their patients who do not have insurance, but you will need to ask so you aren't surprised when you get to your appointment.  2) Contact Your  Local Health Department °Not all health departments have doctors that can see patients for sick visits, but many do, so it is worth a call to see if yours does. If you don't know where your local health department is, you can check in your phone book. The CDC also has a tool to help you locate your state's health department, and many state  websites also have listings of all of their local health departments. ° °3) Find a Walk-in Clinic °If your illness is not likely to be very severe or complicated, you may want to try a walk in clinic. These are popping up all over the country in pharmacies, drugstores, and shopping centers. They're usually staffed by nurse practitioners or physician assistants that have been trained to treat common illnesses and complaints. They're usually fairly quick and inexpensive. However, if you have serious medical issues or chronic medical problems, these are probably not your best option. ° °No Primary Care Doctor: °- Call Health Connect at  832-8000 - they can help you locate a primary care doctor that  accepts your insurance, provides certain services, etc. °- Physician Referral Service- 1-800-533-3463 ° °Chronic Pain Problems: °Organization         Address  Phone   Notes  °[facility]  (336) 297-2271 Patients need to be referred by their primary care doctor.  ° °Medication Assistance: °Organization         Address  Phone   Notes  °[place] [facility] [address] °[place], [place] [address] (336) 641-8030 --Must be a resident of [place] °-- Must have NO insurance coverage whatsoever (no Medicaid/ Medicare, etc.) °-- The pt. MUST have a primary care doctor that directs their care regularly and follows them in the community °  °[facility]  (866) 331-1348   °United Way  (888) 892-1162   ° °Agencies that provide inexpensive medical care: °Organization         Address  Phone   Notes  °[facility]  (336) 832-8035   °[facility]    (336) 832-7272   °[facility] [address] °[place], [place] [address] (336) 832-4777   °[facility] [address], °[place] (336) 271-4999   °[facility]    (336) 373-0678   °[facility]    (336) 272-1050   °[facility] ° [address], [place] Phone:  (336) 832-4444, Fax:  (336) 832-4440 Hours of Operation:  9 am - 6 pm, M-F.  Also accepts Medicaid/Medicare and self-pay.  °[facility] ° [address], [place] Phone: (336) 832-3150, Fax: (336) 832-3151. Hours of Operation:  8:30 am - 5:30 pm, M-F.  Also accepts Medicaid and self-pay.  °[facility] [place] [address], [place] Phone: (336) 878-6027   °[facility] [address], [place], [place] (336)723-1848, Ext. 123 Mondays & Thursdays: 7-9 AM.  First 15 patients are seen on a first come, first serve basis. °  ° °Medicaid-accepting [place] Providers: ° °Organization         Address  Phone   Notes  °[facility] [address], [place] (336) 641-2100 Also accepts self-pay patients.  °[facility] [address], [place] ° (336) 856-9996   °[facility] [address], [place] (336)   288-8857   °[facility] [address] [place] [address], [place] (336) 299-7000   °Veita Bland [address], [place]  ° (336) 373-1557 Only accepts Hand Access Medicaid patients after they have their name applied to their card.  ° °Self-Pay (no insurance) in [place]: ° °Organization         Address  Phone   Notes  °Sickle Cell Patients, [facility] [address], [place] (336) 832-1970   °[facility] [address], [place] (336) 832-4400   °[facility] ° [address], [place] (336) 992-4800   °[facility]/Dr. Osei-Bonsu ° [address] [place] [address], [place] or [address], [place] (336) [phone] Phone number for both [place] and [place] locations is the same.  °[facility] [address], [place] (336) 299-0000   °[facility] [address] [place]  [place] [address], [place] or [address] (336) 852-7530 °(336) 878-2260   °[facility] [address], [place] (336) 350-1642, phone; (336) 294-5005, fax Sees patients 1st and 3rd Saturday of every month.  Must not qualify for public or private insurance (i.e. Medicaid, Medicare, [place] Health Choice, Veterans' Benefits) • Household income should be no more than 200% of the poverty level •The clinic cannot treat you if you are pregnant or think you are pregnant • Sexually transmitted diseases are not treated at the clinic.  ° ° °Dental Care: °Organization         Address  Phone  Notes  °[place] [facility] [address], [place] (336) 641-6152 Accepts children up to age 21 who are enrolled in Medicaid or [place] Health Choice; pregnant women with a Medicaid card; and children who have applied for Medicaid or [place] Health Choice, but were declined, whose parents can pay a reduced fee at time of service.  °[facility] [place]  [address], [place] (336) 641-7733 Accepts children up to age 21 who are enrolled in Medicaid or [place] Health Choice; pregnant women with a Medicaid card; and children who have applied for Medicaid or [place] Health Choice, but were declined, whose parents can pay a reduced fee at time of service.  °[facility] ° [address], [place] (336) 641-4533 Patients are seen by appointment only. Walk-ins are not accepted. [facility] will see patients 18 years of age and older. °Monday - Tuesday (8am-5pm) °Most Wednesdays (8:30-5pm) °$30 per visit, cash only  °[facility] ° [address], [place] (336) [phone] Patients are seen by appointment only. Walk-ins are not accepted. [facility] will see patients 18 years of age and older. °One Wednesday Evening (Monthly: Volunteer Based).  $30 per visit, cash only  °[facility]  (919) 537-3737 for adults; Children under age 4, call Graduate Pediatric Dentistry at (919) [phone]. Children aged 4-14, please call (919) [phone] to request a pediatric application. ° Dental services are provided in all areas of dental care including fillings, crowns and bridges, complete and partial dentures, implants, gum treatment, root canals, and extractions. Preventive care is also provided. Treatment is provided to both adults and children. °Patients are selected via a lottery and there is often a waiting list. °  °[facility] [address], °[place] ° (336)   941-7408 [email or web address]   [facility] [address] [place] [address] [place], [place] [phone], Ext. 123 Second and Fourth Thursday of each month, opens at 6:30 AM; Clinic ends at 9 AM.  Patients are seen on a first-come first-served basis, and a limited number are seen during each clinic.   [facility]  [address] Hillard Danker [place], [place] [phone]   Eligibility Requirements You must have lived in [place], [place], or [place] counties for at least the last three months.   You cannot be eligible for state or federal sponsored [employer], including [employer], [place], or [employer].   You generally cannot be eligible for healthcare insurance through your employer.    How to apply: Eligibility screenings are held every Tuesday and Wednesday afternoon from 1:00 pm until 4:00 pm. You do not need an appointment for the interview!  [facility] [place] [address], [place], [place]   [place]  [place] Department  [place]  [phone]    Behavioral Health Resources in the Community: Intensive Outpatient Programs Organization         Address  Phone  Notes  [place] [place] [facility] [address], [place], [place]  [phone]   [facility] [address] [place], [place]   [facility] [address] [place], [place]   [place] [address],  [place], [place] or (515)864-2820   Substance Abuse Resources Organization         Address  Phone  Notes  [facility]  [phone]   [place]  530-037-6899   [facility] [place]   Chinita Pester  754-351-4612   Residential & Outpatient Substance Abuse Program  (985)616-7758   Psychological Services Organization         Address  Phone  Notes  [facility] [place]  [place]  [phone]   [place] [address], [place] or [phone]    Mobile Crisis Teams Organization         Address  Phone  Notes  [facility], Mobile Crisis Care Unit  (530) 336-4837   [facility]  [address] [place], [place]   [place] [address] [place] [phone]    Self-Help/Support Groups Organization         Address  Phone             Notes  [place] [place] - variety of support groups  [place] Call for more information  Narcotics Anonymous (NA), [facility] [address], [employer] [place]  2 meetings at this location   Special educational needs teacher         Address  Phone  Notes  [facility] [place],    [place]  [phone]   [facility]  [address], [place] [address], [place], [place]   [place], [place] [phone] Admissions: 8am-3pm M-F  [facility] [address]    [place], [place] [phone]   [facility] [address] Jadene Pierini [place], [place]   [facility] [place] [address] [place] [address]   [place], [place]   [facility] -  Intensive Outpatient [address], [place], [place] 336-852-3033   °[facility] [address]  °[place], [place] 1-877-615-2722 or 336-784-9470   °[facility] ([facility]) [address] [place], [place] 336-227-7417 Accepts Medicaid  °[facility] [address]  °[place] 1-800-659-3381 Substance Abuse/Addiction Treatment  ° °[place] Behavioral Health Resources °Organization         Address  Phone  Notes  °[facility]  (888) 581-9988   °Julie Brannon, PhD [address] [place], [place]   (336) 349-5553 or (336) 951-0000   °[facility]   [address] °[place], [place] (336) 349-4454   °[facility] [address], [place], [place] (336) 342-8316 Insurance/Medicaid/sponsorship through [facility]  °[facility] [address]                                    [place], [place] (336) 342-8316 Therapy/tele-psych/case  °[facility] [address]  ° [place],  (336) 349-2233    °Dr. Arfeen  (336) 349-4544   °[facility] [place]  [employer] [place] [facility] 1) [address], [place] °2) [address], [place] °3)  [address], [place] (336) 349-3220 °(336) 342-7768 ° °(336) 342-8140   °[place] Child Abuse Hotline (336) 342-1394 or (336) 342-3537 (After Hours)    ° °
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
## 2015-02-25 NOTE — ED Notes (Signed)
Pt ambulating independently w/ steady gait on d/c in no acute distress, A&Ox4.  

## 2015-02-25 NOTE — ED Notes (Signed)
Bed: RESB Expected date:  Expected time:  Means of arrival:  Comments: combative
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
## 2015-09-04 ENCOUNTER — Emergency Department (HOSPITAL_COMMUNITY)
Admission: EM | Admit: 2015-09-04 | Discharge: 2015-09-05 | Disposition: A | Discharge status: Home / Self Care | Payer: Medicare Other | Attending: Physician Assistant | Admitting: Physician Assistant

## 2015-09-04 ENCOUNTER — Encounter (HOSPITAL_COMMUNITY): Payer: Self-pay | Admitting: Emergency Medicine

## 2015-09-04 DIAGNOSIS — F319 Bipolar disorder, unspecified: Secondary | ICD-10-CM | POA: Diagnosis not present

## 2015-09-04 DIAGNOSIS — R110 Nausea: Secondary | ICD-10-CM | POA: Insufficient documentation

## 2015-09-04 DIAGNOSIS — Z720 Tobacco use: Secondary | ICD-10-CM | POA: Insufficient documentation

## 2015-09-04 DIAGNOSIS — R001 Bradycardia, unspecified: Secondary | ICD-10-CM | POA: Diagnosis not present

## 2015-09-04 DIAGNOSIS — Z7982 Long term (current) use of aspirin: Secondary | ICD-10-CM | POA: Diagnosis not present

## 2015-09-04 DIAGNOSIS — R55 Syncope and collapse: Secondary | ICD-10-CM | POA: Insufficient documentation

## 2015-09-04 DIAGNOSIS — R61 Generalized hyperhidrosis: Secondary | ICD-10-CM | POA: Insufficient documentation

## 2015-09-04 LAB — CBC
HCT: 34.8 % — ABNORMAL LOW (ref 39.0–52.0)
HEMOGLOBIN: 12 g/dL — AB (ref 13.0–17.0)
MCH: 24.4 pg — AB (ref 26.0–34.0)
MCHC: 34.5 g/dL (ref 30.0–36.0)
MCV: 70.7 fL — ABNORMAL LOW (ref 78.0–100.0)
Platelets: 160 10*3/uL (ref 150–400)
RBC: 4.92 MIL/uL (ref 4.22–5.81)
RDW: 16.3 % — ABNORMAL HIGH (ref 11.5–15.5)
WBC: 10.1 10*3/uL (ref 4.0–10.5)

## 2015-09-04 LAB — URINALYSIS, ROUTINE W REFLEX MICROSCOPIC
Bilirubin Urine: NEGATIVE
Glucose, UA: NEGATIVE mg/dL
Hgb urine dipstick: NEGATIVE
Ketones, ur: NEGATIVE mg/dL
LEUKOCYTES UA: NEGATIVE
Nitrite: NEGATIVE
PROTEIN: NEGATIVE mg/dL
Specific Gravity, Urine: 1.02 (ref 1.005–1.030)
Urobilinogen, UA: 1 mg/dL (ref 0.0–1.0)
pH: 6 (ref 5.0–8.0)

## 2015-09-04 LAB — BASIC METABOLIC PANEL
ANION GAP: 9 (ref 5–15)
BUN: 10 mg/dL (ref 6–20)
CHLORIDE: 102 mmol/L (ref 101–111)
CO2: 26 mmol/L (ref 22–32)
Calcium: 8.7 mg/dL — ABNORMAL LOW (ref 8.9–10.3)
Creatinine, Ser: 1.12 mg/dL (ref 0.61–1.24)
GFR calc non Af Amer: >60 mL/min (ref >60)
Glucose, Bld: 111 mg/dL — ABNORMAL HIGH (ref 65–99)
POTASSIUM: 3.1 mmol/L — AB (ref 3.5–5.1)
Sodium: 137 mmol/L (ref 135–145)

## 2015-09-04 LAB — TROPONIN I: Troponin I: <0.03 ng/mL (ref <0.031)

## 2015-09-04 LAB — CBG MONITORING, ED: GLUCOSE-CAPILLARY: 128 mg/dL — AB (ref 65–99)

## 2015-09-04 LAB — T4, FREE: Free T4: 0.78 ng/dL (ref 0.61–1.12)

## 2015-09-04 LAB — TSH: TSH: 7.262 u[IU]/mL — ABNORMAL HIGH (ref 0.350–4.500)

## 2015-09-04 NOTE — ED Provider Notes (Signed)
CSN: [id number]     Arrival date & time 09/04/15  2108 History   First MD Initiated Contact with Patient 09/04/15 2127     Chief Complaint  Patient presents with  . Near Syncope    The history is provided by the patient, medical records and the EMS personnel.     69 yo M with hx bipolar disorder, PTSD, hypothyroid presenting with pre syncopal episode. Onset: suddenly. Context: pt was sitting in chair at barber when sx began when he just started to stand up from chair. Described as feeling suddenly diaphoretic and nauseated. Alleviated with rest. No similar prior episodes. No reported recent illnesses, denies fevers, URI sx, headache or falls, focal weakness, slurred speech, vision changes. EMS reports pt was hypotensive to 70s/40s, normalized by arrival here.    Past Medical History  Diagnosis Date  . Bipolar 1 disorder ([place])   . PTSD (post-traumatic stress disorder)    Past Surgical History  Procedure Laterality Date  . Gallbladder surgery     History reviewed. No pertinent family history. Social History  Substance Use Topics  . Smoking status: Current Every Day Smoker -- 0.50 packs/day    Types: Cigarettes    Last Attempt to Quit: 09/03/2014  . Smokeless tobacco: None  . Alcohol Use: Yes     Comment: socially    Review of Systems  Constitutional: Positive for diaphoresis. Negative for fever.  HENT: Negative for rhinorrhea.   Eyes: Negative for visual disturbance.  Respiratory: Negative for shortness of breath.   Cardiovascular: Negative for chest pain.  Gastrointestinal: Positive for nausea. Negative for vomiting and abdominal pain.  Genitourinary: Negative for decreased urine volume.  Skin: Negative for rash.  Allergic/Immunologic: Negative for immunocompromised state.  Neurological: Positive for light-headedness. Negative for syncope (presyncopal only), speech difficulty, weakness and headaches.  Psychiatric/Behavioral: Negative for confusion.      Allergies   Review of patient's allergies indicates no known allergies.  Home Medications   Prior to Admission medications   Medication Sig Start Date End Date Taking? Authorizing Provider  aspirin EC 81 MG tablet Take 81 mg by mouth daily.    Historical Provider, MD  HYDROcodone-acetaminophen (NORCO/VICODIN) 5-325 MG per tablet Take 1-2 tablets by mouth every 4 (four) hours as needed. 05/08/14   Dorie Rank, MD  LEVOTHYROXINE SODIUM PO Take 1 tablet by mouth daily.    Historical Provider, MD  MELATONIN PO Take 1 tablet by mouth at bedtime.    Historical Provider, MD  naproxen (NAPROSYN) 500 MG tablet Take 1 tablet (500 mg total) by mouth 2 (two) times daily with a meal. As needed for pain 05/08/14   Dorie Rank, MD   BP 151/55 mmHg  Pulse 51  Temp(Src) 97.7 F (36.5 C) (Oral)  Resp 16  SpO2 97% Physical Exam  Constitutional: He is oriented to person, place, and time. He appears well-developed and well-nourished. No distress.  HENT:  Head: Normocephalic and atraumatic.  Eyes: Right eye exhibits no discharge. Left eye exhibits no discharge.  Neck: No tracheal deviation present.  Cardiovascular: Regular rhythm.  Bradycardia present.   Pulmonary/Chest: Effort normal and breath sounds normal. No respiratory distress.  Abdominal: Soft. He exhibits no distension. There is no tenderness.  Musculoskeletal: He exhibits no edema.  Neurological: He is alert and oriented to person, place, and time. He has normal strength and normal reflexes. No cranial nerve deficit or sensory deficit. He displays no seizure activity. Coordination and gait normal. GCS eye subscore is 4. GCS  verbal subscore is 5. GCS motor subscore is 6.  Skin: Skin is warm and dry.  Psychiatric: He has a normal mood and affect. His behavior is normal.    ED Course  Procedures (including critical care time) Labs Review Labs Reviewed  BASIC METABOLIC PANEL  CBC  URINALYSIS, ROUTINE W REFLEX MICROSCOPIC (NOT AT [facility])  CBG MONITORING, ED     Imaging Review No results found. I have personally reviewed and evaluated these images and lab results as part of my medical decision-making.   EKG Interpretation   Date/Time:  Saturday September 04 2015 21:15:20 EDT Ventricular Rate:  51 PR Interval:  179 QRS Duration: 80 QT Interval:  518 QTC Calculation: 477 R Axis:   53 Text Interpretation:  ED PHYSICIAN INTERPRETATION AVAILABLE IN [facility] Sinus rhythm Atrial premature complexes Borderline T  abnormalities, inferior leads Minimal ST elevation, anterior leads  Borderline prolonged QT interval Confirmed by TEST, Record ([id number]) on  09/05/2015 7:48:21 AM      MDM   Final diagnoses:  None    69 yo M with hx bipolar disorder, PTSD, hypothyroid presenting with pre syncopal episode. AF, VSS upon arrival, no longer hypotensive. Possible vasovagal vs orthostatic hypotension. However, concern for possible anginal equivalent in setting of cardiovascular risk factors and EKG as above. Trop neg x1. Glucose and electrolytes unremarkable. TSH elevated but free T4 within nl range, advised f/u with PCP regarding dosing of synthroid. No leukocytosis or significant anemia. No hx or exam findings suggestive of infectious etiology. Will obtain second troponin to rule out ACS. If neg, plan for discharge home with close PCP f/u and return precautions.   Care transferred to Dr. Doy Mince while awaiting second trop.   Case discussed with Dr. Thomasene Lot who oversaw management of this patient.     Ivin Booty, MD 09/05/15 7711  Courteney Julio Alm, MD 09/09/15 1622

## 2015-09-04 NOTE — ED Notes (Signed)
Pt presents with [facility] for near-syncope at the barber shop when changing positions from sitting to standing; bystanders report patient turned pale and diaphoretic; PTAR reports pt was hypotensive (70/40s); pt CAOx4 at this time; VSS

## 2015-09-05 LAB — I-STAT TROPONIN, ED: Troponin i, poc: 0 ng/mL (ref 0.00–0.08)

## 2015-09-05 NOTE — Discharge Instructions (Signed)
Near-Syncope Near-syncope (commonly known as near fainting) is sudden weakness, dizziness, or feeling like you might pass out. During an episode of near-syncope, you may also develop pale skin, have tunnel vision, or feel sick to your stomach (nauseous). Near-syncope may occur when getting up after sitting or while standing for a long time. It is caused by a sudden decrease in blood flow to the brain. This decrease can result from various causes or triggers, most of which are not serious. However, because near-syncope can sometimes be a sign of something serious, a medical evaluation is required. The specific cause is often not determined. HOME CARE INSTRUCTIONS  Monitor your condition for any changes. The following actions may help to alleviate any discomfort you are experiencing:  Have someone stay with you until you feel stable.  Lie down right away and prop your feet up if you start feeling like you might faint. Breathe deeply and steadily. Wait until all the symptoms have passed. Most of these episodes last only a few minutes. You may feel tired for several hours.   Drink enough fluids to keep your urine clear or pale yellow.   If you are taking blood pressure or heart medicine, get up slowly when seated or lying down. Take several minutes to sit and then stand. This can reduce dizziness.  Follow up with your health care provider as directed. SEEK IMMEDIATE MEDICAL CARE IF:   You have a severe headache.   You have unusual pain in the chest, abdomen, or back.   You are bleeding from the mouth or rectum, or you have black or tarry stool.   You have an irregular or very fast heartbeat.   You have repeated fainting or have seizure-like jerking during an episode.   You faint when sitting or lying down.   You have confusion.   You have difficulty walking.   You have severe weakness.   You have vision problems.  MAKE SURE YOU:   Understand these instructions.  Will  watch your condition.  Will get help right away if you are not doing well or get worse. Document Released: 11/20/2005 Document Revised: 11/25/2013 Document Reviewed: 04/25/2013 Exit[employer] Patient Information 2015 [employer]. This information is not intended to replace advice given to you by your health care provider. Make sure you discuss any questions you have with your health care provider.  

## 2015-09-05 NOTE — ED Provider Notes (Signed)
Care assumed from Dr. Thomasene Lot with plan to DC if delta troponin negative.  It is negative.    Clinical Impression: 1. Near syncope       Serita Grit, MD 09/05/15 [phone]

## 2015-11-06 ENCOUNTER — Emergency Department (HOSPITAL_COMMUNITY): Payer: No Typology Code available for payment source

## 2015-11-06 ENCOUNTER — Encounter (HOSPITAL_COMMUNITY): Payer: Self-pay | Admitting: Emergency Medicine

## 2015-11-06 ENCOUNTER — Emergency Department (HOSPITAL_COMMUNITY)
Admission: EM | Admit: 2015-11-06 | Discharge: 2015-11-06 | Disposition: A | Discharge status: Home / Self Care | Payer: No Typology Code available for payment source | Attending: Emergency Medicine | Admitting: Emergency Medicine

## 2015-11-06 DIAGNOSIS — F17210 Nicotine dependence, cigarettes, uncomplicated: Secondary | ICD-10-CM | POA: Insufficient documentation

## 2015-11-06 DIAGNOSIS — Z79899 Other long term (current) drug therapy: Secondary | ICD-10-CM | POA: Diagnosis not present

## 2015-11-06 DIAGNOSIS — Z7982 Long term (current) use of aspirin: Secondary | ICD-10-CM | POA: Diagnosis not present

## 2015-11-06 DIAGNOSIS — Z8659 Personal history of other mental and behavioral disorders: Secondary | ICD-10-CM | POA: Insufficient documentation

## 2015-11-06 DIAGNOSIS — Y9389 Activity, other specified: Secondary | ICD-10-CM | POA: Diagnosis not present

## 2015-11-06 DIAGNOSIS — Y998 Other external cause status: Secondary | ICD-10-CM | POA: Diagnosis not present

## 2015-11-06 DIAGNOSIS — M549 Dorsalgia, unspecified: Secondary | ICD-10-CM

## 2015-11-06 DIAGNOSIS — S3992XA Unspecified injury of lower back, initial encounter: Secondary | ICD-10-CM | POA: Diagnosis present

## 2015-11-06 DIAGNOSIS — Y92410 Unspecified street and highway as the place of occurrence of the external cause: Secondary | ICD-10-CM | POA: Insufficient documentation

## 2015-11-06 MED ORDER — NAPROXEN 500 MG PO TABS: 500.0000 mg | ORAL_TABLET | Freq: Two times a day (BID) | ORAL | Status: DC
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
## 2015-11-06 NOTE — Discharge Instructions (Signed)
Take the prescribed medication as directed. Follow-up with your primary care physician if you continue having issues. Return to the ED for new or worsening symptoms.

## 2016-09-09 ENCOUNTER — Emergency Department (HOSPITAL_COMMUNITY)
Admission: EM | Admit: 2016-09-09 | Discharge: 2016-09-10 | Disposition: A | Discharge status: Home / Self Care | Payer: Medicare Other | Attending: Emergency Medicine | Admitting: Emergency Medicine

## 2016-09-09 ENCOUNTER — Encounter (HOSPITAL_COMMUNITY): Payer: Self-pay | Admitting: Emergency Medicine

## 2016-09-09 DIAGNOSIS — F17210 Nicotine dependence, cigarettes, uncomplicated: Secondary | ICD-10-CM | POA: Diagnosis not present

## 2016-09-09 DIAGNOSIS — E860 Dehydration: Secondary | ICD-10-CM

## 2016-09-09 DIAGNOSIS — Z79899 Other long term (current) drug therapy: Secondary | ICD-10-CM | POA: Diagnosis not present

## 2016-09-09 DIAGNOSIS — Z7982 Long term (current) use of aspirin: Secondary | ICD-10-CM | POA: Diagnosis not present

## 2016-09-09 DIAGNOSIS — R531 Weakness: Secondary | ICD-10-CM

## 2016-09-09 DIAGNOSIS — E876 Hypokalemia: Secondary | ICD-10-CM | POA: Diagnosis not present

## 2016-09-09 DIAGNOSIS — R55 Syncope and collapse: Secondary | ICD-10-CM | POA: Diagnosis present

## 2016-09-09 MED ORDER — SODIUM CHLORIDE 0.9 % IV BOLUS (SEPSIS)
1000.0000 mL | Freq: Once | INTRAVENOUS | Status: AC
  Administered 2016-09-09: 1000 mL via INTRAVENOUS

## 2016-09-09 NOTE — ED Notes (Signed)
Bed: [id number] Expected date:  Expected time:  Means of arrival:  Comments: 70 yo M  Syncopal episode

## 2016-09-09 NOTE — ED Triage Notes (Signed)
Brought in by EMS from home with c/o near syncope.  Pt reported that he got dizzy while in the kitchen, bent over and was assisted by spouse when he felt he was going to pass out.  Pt denied complete loss of consciousness.  Pt reported that he has been working all day today and has had not time to eat anything.  Pt was given NS 1000 ml bolus by EMS.

## 2016-09-10 LAB — CBC
HCT: 31.6 % — ABNORMAL LOW (ref 39.0–52.0)
Hemoglobin: 10.7 g/dL — ABNORMAL LOW (ref 13.0–17.0)
MCH: 23.6 pg — ABNORMAL LOW (ref 26.0–34.0)
MCHC: 33.9 g/dL (ref 30.0–36.0)
MCV: 69.6 fL — ABNORMAL LOW (ref 78.0–100.0)
Platelets: 131 10*3/uL — ABNORMAL LOW (ref 150–400)
RBC: 4.54 MIL/uL (ref 4.22–5.81)
RDW: 15.4 % (ref 11.5–15.5)
WBC: 6.9 10*3/uL (ref 4.0–10.5)

## 2016-09-10 LAB — BASIC METABOLIC PANEL
Anion gap: 9 (ref 5–15)
BUN: 13 mg/dL (ref 6–20)
CALCIUM: 8.5 mg/dL — AB (ref 8.9–10.3)
CO2: 24 mmol/L (ref 22–32)
CREATININE: 1.05 mg/dL (ref 0.61–1.24)
Chloride: 107 mmol/L (ref 101–111)
GFR calc Af Amer: >60 mL/min (ref >60)
GLUCOSE: 89 mg/dL (ref 65–99)
Potassium: 2.9 mmol/L — ABNORMAL LOW (ref 3.5–5.1)
Sodium: 140 mmol/L (ref 135–145)

## 2016-09-10 MED ORDER — POTASSIUM CHLORIDE ER 10 MEQ PO TBCR: 10.0000 meq | EXTENDED_RELEASE_TABLET | Freq: Every day | ORAL | 0 refills | Status: DC

## 2016-09-10 MED ORDER — POTASSIUM CHLORIDE CRYS ER 20 MEQ PO TBCR
40.0000 meq | EXTENDED_RELEASE_TABLET | Freq: Once | ORAL | Status: AC
  Administered 2016-09-10: 40 meq via ORAL
  Filled 2016-09-10: qty 2

## 2016-09-10 NOTE — ED Provider Notes (Addendum)
[facility] DEPT Provider Note   CSN: [id number] Arrival date & time: 09/09/16  2322     History   Chief Complaint Chief Complaint  Patient presents with  . Near Syncope    HPI Rodney Romero is a 70 y.o. male.  HPI Patient states she stood up and felt lightheaded and weak and felt like he was going to pass out.  He denied preceding chest pain or palpitations.  He did not lose consciousness.  He denies abdominal pain.  He was given fluids by EMS and feels much better this time.  He reports decreased oral intake today.  Symptoms are moderate in severity   Past Medical History:  Diagnosis Date  . Bipolar 1 disorder ([place])   . PTSD (post-traumatic stress disorder)     There are no active problems to display for this patient.   Past Surgical History:  Procedure Laterality Date  . GALLBLADDER SURGERY         Home Medications    Prior to Admission medications   Medication Sig Start Date End Date Taking? Authorizing Provider  aspirin EC 81 MG tablet Take 81 mg by mouth daily.    Historical Provider, MD  Cyanocobalamin (VITAMIN B-12 PO) Take 1 tablet by mouth daily.    Historical Provider, MD  ferrous sulfate 325 (65 FE) MG tablet Take 325 mg by mouth 2 (two) times daily with a meal.    Historical Provider, MD  HYDROcodone-acetaminophen (NORCO/VICODIN) 5-325 MG per tablet Take 1-2 tablets by mouth every 4 (four) hours as needed. Patient not taking: Reported on 09/04/2015 05/08/14   Dorie Rank, MD  LEVOTHYROXINE SODIUM PO Take 1 tablet by mouth daily before breakfast.     Historical Provider, MD  MELATONIN PO Take 1 tablet by mouth at bedtime.    Historical Provider, MD  naproxen (NAPROSYN) 500 MG tablet Take 1 tablet (500 mg total) by mouth 2 (two) times daily with a meal. 11/06/15   Larene Pickett, PA-C  potassium chloride (K-DUR) 10 MEQ tablet Take 1 tablet (10 mEq total) by mouth daily. 09/10/16   Jola Schmidt, MD  PRESCRIPTION MEDICATION Take 1 tablet by mouth daily. For  memory    Historical Provider, MD    Family History History reviewed. No pertinent family history.  Social History Social History  Substance Use Topics  . Smoking status: Current Every Day Smoker    Packs/day: 0.00    Types: Cigarettes    Last attempt to quit: 09/03/2014  . Smokeless tobacco: Never Used  . Alcohol use Yes     Comment: socially     Allergies   Review of patient's allergies indicates no known allergies.   Review of Systems Review of Systems  All other systems reviewed and are negative.    Physical Exam Updated Vital Signs BP 140/78 (BP Location: Left Arm)   Pulse (!) 44   Temp 97.6 F (36.4 C) (Oral)   Resp 17   Ht 5\' 7"  (1.702 m)   Wt 135 lb (61.2 kg)   SpO2 99%   BMI 21.14 kg/m   Physical Exam  Constitutional: He is oriented to person, place, and time. He appears well-developed and well-nourished.  HENT:  Head: Normocephalic and atraumatic.  Eyes: EOM are normal.  Neck: Normal range of motion.  Cardiovascular: Normal rate, regular rhythm, normal heart sounds and intact distal pulses.   Pulmonary/Chest: Effort normal and breath sounds normal. No respiratory distress.  Abdominal: Soft. He exhibits no distension. There  is no tenderness.  Musculoskeletal: Normal range of motion.  Neurological: He is alert and oriented to person, place, and time.  Skin: Skin is warm and dry.  Psychiatric: He has a normal mood and affect. Judgment normal.  Nursing note and vitals reviewed.    ED Treatments / Results  Labs (all labs ordered are listed, but only abnormal results are displayed) Labs Reviewed  CBC - Abnormal; Notable for the following:       Result Value   Hemoglobin 10.7 (*)    HCT 31.6 (*)    MCV 69.6 (*)    MCH 23.6 (*)    Platelets 131 (*)    All other components within normal limits  BASIC METABOLIC PANEL - Abnormal; Notable for the following:    Potassium 2.9 (*)    Calcium 8.5 (*)    All other components within normal limits     EKG ECG interpretation   Date: 09/10/2016  Rate: 61  Rhythm: normal sinus rhythm  QRS Axis: normal  Intervals: normal  ST/T Wave abnormalities: normal  Conduction Disutrbances:   Narrative Interpretation: several PACs  Old EKG Reviewed: No significant changes noted     Radiology No results found.  Procedures Procedures (including critical care time)  Medications Ordered in ED Medications  potassium chloride SA (K-DUR,KLOR-CON) CR tablet 40 mEq (not administered)  sodium chloride 0.9 % bolus 1,000 mL (1,000 mLs Intravenous New Bag/Given 09/09/16 2359)     Initial Impression / Assessment and Plan / ED Course  I have reviewed the triage vital signs and the nursing notes.  Pertinent labs & imaging results that were available during my care of the patient were reviewed by me and considered in my medical decision making (see chart for details).  Clinical Course    Overall well-appearing.  Patient feels much better this time.  Suspect dehydration.  Patient hydrated in the ER.  Mild hypokalemia.  This will be repleted.  Discharge home in good condition.  Primary care follow-up.  He understands to return to the ER for new or worsening symptoms  Final Clinical Impressions(s) / ED Diagnoses   Final diagnoses:  Weakness  Dehydration  Hypokalemia    New Prescriptions New Prescriptions   POTASSIUM CHLORIDE (K-DUR) 10 MEQ TABLET    Take 1 tablet (10 mEq total) by mouth daily.     Jola Schmidt, MD 09/10/16 [place], MD 09/10/16 [phone]

## 2018-03-06 ENCOUNTER — Emergency Department (HOSPITAL_COMMUNITY): Payer: Medicare Other

## 2018-03-06 ENCOUNTER — Encounter (HOSPITAL_COMMUNITY): Payer: Self-pay | Admitting: Family Medicine

## 2018-03-06 DIAGNOSIS — R0789 Other chest pain: Secondary | ICD-10-CM | POA: Insufficient documentation

## 2018-03-06 DIAGNOSIS — F17210 Nicotine dependence, cigarettes, uncomplicated: Secondary | ICD-10-CM | POA: Insufficient documentation

## 2018-03-06 DIAGNOSIS — D509 Iron deficiency anemia, unspecified: Secondary | ICD-10-CM | POA: Insufficient documentation

## 2018-03-06 DIAGNOSIS — Z79899 Other long term (current) drug therapy: Secondary | ICD-10-CM | POA: Diagnosis not present

## 2018-03-06 DIAGNOSIS — Z7982 Long term (current) use of aspirin: Secondary | ICD-10-CM | POA: Diagnosis not present

## 2018-03-06 DIAGNOSIS — E039 Hypothyroidism, unspecified: Secondary | ICD-10-CM | POA: Diagnosis not present

## 2018-03-06 DIAGNOSIS — I10 Essential (primary) hypertension: Secondary | ICD-10-CM | POA: Diagnosis not present

## 2018-03-06 DIAGNOSIS — R079 Chest pain, unspecified: Secondary | ICD-10-CM | POA: Diagnosis present

## 2018-03-06 LAB — CBC
HEMATOCRIT: 37.1 % — AB (ref 39.0–52.0)
HEMOGLOBIN: 12.2 g/dL — AB (ref 13.0–17.0)
MCH: 23.6 pg — AB (ref 26.0–34.0)
MCHC: 32.9 g/dL (ref 30.0–36.0)
MCV: 71.6 fL — AB (ref 78.0–100.0)
PLATELETS: 153 10*3/uL (ref 150–400)
RBC: 5.18 MIL/uL (ref 4.22–5.81)
RDW: 14.9 % (ref 11.5–15.5)
WBC: 7.4 10*3/uL (ref 4.0–10.5)

## 2018-03-06 LAB — BASIC METABOLIC PANEL
ANION GAP: 8 (ref 5–15)
BUN: 11 mg/dL (ref 6–20)
CHLORIDE: 105 mmol/L (ref 101–111)
CO2: 28 mmol/L (ref 22–32)
Calcium: 9.1 mg/dL (ref 8.9–10.3)
Creatinine, Ser: 1.08 mg/dL (ref 0.61–1.24)
GFR calc non Af Amer: >60 mL/min (ref >60)
Glucose, Bld: 107 mg/dL — ABNORMAL HIGH (ref 65–99)
POTASSIUM: 4.2 mmol/L (ref 3.5–5.1)
SODIUM: 141 mmol/L (ref 135–145)

## 2018-03-06 LAB — I-STAT TROPONIN, ED: Troponin i, poc: 0 ng/mL (ref 0.00–0.08)

## 2018-03-06 NOTE — ED Triage Notes (Signed)
Patient reports he is experiencing right sided chest pain, thoraic pain, and dizziness. Symptoms started yesterday. Patient drove himself here with no family accompanying patient.

## 2018-03-07 ENCOUNTER — Emergency Department (HOSPITAL_COMMUNITY)
Admission: EM | Admit: 2018-03-07 | Discharge: 2018-03-07 | Disposition: A | Discharge status: Home / Self Care | Payer: Medicare Other | Attending: Emergency Medicine | Admitting: Emergency Medicine

## 2018-03-07 DIAGNOSIS — R0789 Other chest pain: Secondary | ICD-10-CM

## 2018-03-07 DIAGNOSIS — D509 Iron deficiency anemia, unspecified: Secondary | ICD-10-CM

## 2018-03-07 HISTORY — DX: Essential (primary) hypertension: I10

## 2018-03-07 HISTORY — DX: Hypothyroidism, unspecified: E03.9

## 2018-03-07 LAB — I-STAT TROPONIN, ED: TROPONIN I, POC: 0 ng/mL (ref 0.00–0.08)

## 2018-03-07 MED ORDER — NAPROXEN 500 MG PO TABS: 500.0000 mg | ORAL_TABLET | Freq: Two times a day (BID) | ORAL | 0 refills | Status: DC

## 2018-03-07 MED ORDER — IBUPROFEN 800 MG PO TABS
800.0000 mg | ORAL_TABLET | Freq: Once | ORAL | Status: AC
  Administered 2018-03-07: 800 mg via ORAL
  Filled 2018-03-07: qty 1

## 2018-03-07 NOTE — Discharge Instructions (Signed)
Return if symptoms are getting worse. °

## 2018-03-07 NOTE — ED Provider Notes (Signed)
[facility] DEPT Provider Note   CSN: [id number] Arrival date & time: 03/06/18  2003     History   Chief Complaint Chief Complaint  Patient presents with  . Chest Pain  . Back Pain  . Dizziness    HPI Rodney Romero is a 72 y.o. male.  The history is provided by the patient.  He has history of hypertension, bipolar disorder and comes in with chest pain for the last 36 hours.  Pain is right-sided and intermittent.  He states it feels like there is a balloon inside him him.  He denies dyspnea, nausea, diaphoresis.  He denies fever or chills.  He denies cough.  He denies recent viral illness.  He rates pain at 2/10.  It is worse if he takes a deep breath, but nothing seems to make it any better.  He has not done anything to treat it at home.  There is no history of diabetes or hyperlipidemia and no family history of premature coronary atherosclerosis.  Past Medical History:  Diagnosis Date  . Bipolar 1 disorder ([place])   . Hypertension   . Hypothyroidism   . PTSD (post-traumatic stress disorder)     There are no active problems to display for this patient.   Past Surgical History:  Procedure Laterality Date  . GALLBLADDER SURGERY          Home Medications    Prior to Admission medications   Medication Sig Start Date End Date Taking? Authorizing Provider  AMLODIPINE BESYLATE PO Take by mouth.   Yes [provider]  aspirin EC 81 MG tablet Take 81 mg by mouth daily.   Yes [provider]  Cyanocobalamin (VITAMIN B-12 PO) Take 1 tablet by mouth daily.   Yes [provider]  ferrous sulfate 325 (65 FE) MG tablet Take 325 mg by mouth 2 (two) times daily with a meal.   Yes [provider]  LEVOTHYROXINE SODIUM PO Take 1 tablet by mouth daily before breakfast.    Yes [provider]  MELATONIN PO Take 1 tablet by mouth at bedtime.   Yes [provider]  HYDROcodone-acetaminophen  (NORCO/VICODIN) 5-325 MG per tablet Take 1-2 tablets by mouth every 4 (four) hours as needed. Patient not taking: Reported on 09/04/2015 05/08/14   Dorie Rank, MD  naproxen (NAPROSYN) 500 MG tablet Take 1 tablet (500 mg total) by mouth 2 (two) times daily with a meal. Patient not taking: Reported on 03/07/2018 11/06/15   Larene Pickett, PA-C  potassium chloride (K-DUR) 10 MEQ tablet Take 1 tablet (10 mEq total) by mouth daily. Patient not taking: Reported on 03/07/2018 09/10/16   Jola Schmidt, MD  PRESCRIPTION MEDICATION Take 1 tablet by mouth daily. For memory    [provider]    Family History History reviewed. No pertinent family history.  Social History Social History   Tobacco Use  . Smoking status: Current Every Day Smoker    Packs/day: 0.50    Types: Cigarettes    Last attempt to quit: 09/03/2014    Years since quitting: 3.5  . Smokeless tobacco: Never Used  Substance Use Topics  . Alcohol use: Yes    Comment: Everyother day   . Drug use: No     Allergies   Patient has no known allergies.   Review of Systems Review of Systems  All other systems reviewed and are negative.    Physical Exam Updated Vital Signs BP (!) 150/77 (BP Location:  Left Arm)   Pulse (!) 43   Temp 98.1 F (36.7 C) (Oral)   Resp 13   Ht 5\' 7"  (1.702 m)   Wt 59 kg (130 lb)   SpO2 93%   BMI 20.36 kg/m   Physical Exam  Nursing note and vitals reviewed.  72 year old male, resting comfortably and in no acute distress. Vital signs are significant for bradycardia and elevated systolic blood pressure. Oxygen saturation is 93%, which is normal. Head is normocephalic and atraumatic. PERRLA, EOMI. Oropharynx is clear. Neck is nontender and supple without adenopathy or JVD. Back is nontender and there is no CVA tenderness. Lungs are clear without rales, wheezes, or rhonchi. Chest is nontender. Heart has regular rate and rhythm without murmur. Abdomen is soft, flat, nontender without masses  or hepatosplenomegaly and peristalsis is normoactive. Extremities have no cyanosis or edema, full range of motion is present. Skin is warm and dry without rash. Neurologic: Mental status is normal, cranial nerves are intact, there are no motor or sensory deficits.  ED Treatments / Results  Labs (all labs ordered are listed, but only abnormal results are displayed) Labs Reviewed  BASIC METABOLIC PANEL - Abnormal; Notable for the following components:      Result Value   Glucose, Bld 107 (*)    All other components within normal limits  CBC - Abnormal; Notable for the following components:   Hemoglobin 12.2 (*)    HCT 37.1 (*)    MCV 71.6 (*)    MCH 23.6 (*)    All other components within normal limits  I-STAT TROPONIN, ED  I-STAT TROPONIN, ED    EKG EKG Interpretation  Date/Time:  Wednesday March 06 2018 20:13:28 EDT Ventricular Rate:  55 PR Interval:    QRS Duration: 82 QT Interval:  426 QTC Calculation: 408 R Axis:   58 Text Interpretation:  Sinus rhythm Atrial premature complex Probable left atrial enlargement Probable left ventricular hypertrophy When compared with ECG of 09/10/2016, Nonspecific T wave abnormality has improved Confirmed by Delora Fuel ([id number]) on 03/07/2018 2:11:34 AM   Radiology Dg Chest 2 View  Result Date: 03/06/2018 CLINICAL DATA:  Chest pain. EXAM: CHEST - 2 VIEW COMPARISON:  Radiographs of May 08, 2014. FINDINGS: The heart size and mediastinal contours are within normal limits. Both lungs are clear. No pneumothorax or pleural effusion is noted. The visualized skeletal structures are unremarkable. IMPRESSION: No active cardiopulmonary disease. Electronically Signed   By: Marijo Conception, M.D.   On: 03/06/2018 20:55    Procedures Procedures (including critical care time)  Medications Ordered in ED Medications  ibuprofen (ADVIL,MOTRIN) tablet 800 mg (800 mg Oral Given 03/07/18 0414)     Initial Impression / Assessment and Plan / ED Course  I have  reviewed the triage vital signs and the nursing notes.  Pertinent labs & imaging results that were available during my care of the patient were reviewed by me and considered in my medical decision making (see chart for details).  Chest pain which is somewhat atypical in nature.  ECG shows no acute changes.  Troponin is negative x2.  Chest x-ray is normal.  Heart score is 3, which puts him at low risk for major adverse cardiac events in the next 30 days.  Old records are reviewed, and he has prior ED visits for pleurisy.  He is given a dose of ibuprofen and discharged with prescription for naproxen.  Referred back to PCP for further outpatient workup.  Return precautions  discussed.  Final Clinical Impressions(s) / ED Diagnoses   Final diagnoses:  Atypical chest pain  Microcytic anemia    ED Discharge Orders        Ordered    naproxen (NAPROSYN) 500 MG tablet  2 times daily with meals     85/50/15 [id number]       Delora Fuel, MD 57/49/35 [phone]

## 2018-06-24 ENCOUNTER — Inpatient Hospital Stay (HOSPITAL_COMMUNITY)
Admission: EM | Admit: 2018-06-24 | Discharge: 2018-06-30 | DRG: 494 | Disposition: A | Discharge status: Home Health | Payer: Medicare Other | Attending: Orthopedic Surgery | Admitting: Orthopedic Surgery

## 2018-06-24 DIAGNOSIS — S82392A Other fracture of lower end of left tibia, initial encounter for closed fracture: Secondary | ICD-10-CM | POA: Diagnosis not present

## 2018-06-24 DIAGNOSIS — S82209A Unspecified fracture of shaft of unspecified tibia, initial encounter for closed fracture: Secondary | ICD-10-CM | POA: Diagnosis present

## 2018-06-24 DIAGNOSIS — Z7982 Long term (current) use of aspirin: Secondary | ICD-10-CM

## 2018-06-24 DIAGNOSIS — D649 Anemia, unspecified: Secondary | ICD-10-CM | POA: Diagnosis present

## 2018-06-24 DIAGNOSIS — Z79890 Hormone replacement therapy (postmenopausal): Secondary | ICD-10-CM

## 2018-06-24 DIAGNOSIS — F17210 Nicotine dependence, cigarettes, uncomplicated: Secondary | ICD-10-CM | POA: Diagnosis present

## 2018-06-24 DIAGNOSIS — S82232A Displaced oblique fracture of shaft of left tibia, initial encounter for closed fracture: Principal | ICD-10-CM | POA: Diagnosis present

## 2018-06-24 DIAGNOSIS — F319 Bipolar disorder, unspecified: Secondary | ICD-10-CM | POA: Diagnosis present

## 2018-06-24 DIAGNOSIS — F4310 Post-traumatic stress disorder, unspecified: Secondary | ICD-10-CM | POA: Diagnosis present

## 2018-06-24 DIAGNOSIS — D696 Thrombocytopenia, unspecified: Secondary | ICD-10-CM | POA: Diagnosis present

## 2018-06-24 DIAGNOSIS — F10229 Alcohol dependence with intoxication, unspecified: Secondary | ICD-10-CM | POA: Diagnosis present

## 2018-06-24 DIAGNOSIS — Z791 Long term (current) use of non-steroidal anti-inflammatories (NSAID): Secondary | ICD-10-CM

## 2018-06-24 DIAGNOSIS — E876 Hypokalemia: Secondary | ICD-10-CM | POA: Diagnosis present

## 2018-06-24 DIAGNOSIS — W1830XA Fall on same level, unspecified, initial encounter: Secondary | ICD-10-CM | POA: Diagnosis present

## 2018-06-24 DIAGNOSIS — Y92000 Kitchen of unspecified non-institutional (private) residence as  the place of occurrence of the external cause: Secondary | ICD-10-CM

## 2018-06-24 DIAGNOSIS — K219 Gastro-esophageal reflux disease without esophagitis: Secondary | ICD-10-CM | POA: Diagnosis present

## 2018-06-24 DIAGNOSIS — Z419 Encounter for procedure for purposes other than remedying health state, unspecified: Secondary | ICD-10-CM

## 2018-06-24 DIAGNOSIS — E039 Hypothyroidism, unspecified: Secondary | ICD-10-CM | POA: Diagnosis present

## 2018-06-24 DIAGNOSIS — Z79899 Other long term (current) drug therapy: Secondary | ICD-10-CM

## 2018-06-24 DIAGNOSIS — I10 Essential (primary) hypertension: Secondary | ICD-10-CM | POA: Diagnosis present

## 2018-06-24 NOTE — ED Notes (Signed)
Bed: WA17 Expected date:  Expected time:  Means of arrival:  Comments: EMS 72 yo male dizzy/fall-left knee crepitus-SBP 80 MP bradycardia-sleeping pill and ETOH

## 2018-06-25 ENCOUNTER — Other Ambulatory Visit: Payer: Self-pay

## 2018-06-25 ENCOUNTER — Emergency Department (HOSPITAL_COMMUNITY): Payer: Medicare Other

## 2018-06-25 ENCOUNTER — Encounter (HOSPITAL_COMMUNITY): Payer: Self-pay | Admitting: Emergency Medicine

## 2018-06-25 DIAGNOSIS — S82232A Displaced oblique fracture of shaft of left tibia, initial encounter for closed fracture: Secondary | ICD-10-CM | POA: Diagnosis present

## 2018-06-25 DIAGNOSIS — S82392A Other fracture of lower end of left tibia, initial encounter for closed fracture: Secondary | ICD-10-CM | POA: Diagnosis present

## 2018-06-25 DIAGNOSIS — K219 Gastro-esophageal reflux disease without esophagitis: Secondary | ICD-10-CM | POA: Diagnosis present

## 2018-06-25 DIAGNOSIS — Z7982 Long term (current) use of aspirin: Secondary | ICD-10-CM | POA: Diagnosis not present

## 2018-06-25 DIAGNOSIS — Z791 Long term (current) use of non-steroidal anti-inflammatories (NSAID): Secondary | ICD-10-CM | POA: Diagnosis not present

## 2018-06-25 DIAGNOSIS — S82209A Unspecified fracture of shaft of unspecified tibia, initial encounter for closed fracture: Secondary | ICD-10-CM | POA: Diagnosis present

## 2018-06-25 DIAGNOSIS — D696 Thrombocytopenia, unspecified: Secondary | ICD-10-CM | POA: Diagnosis present

## 2018-06-25 DIAGNOSIS — I10 Essential (primary) hypertension: Secondary | ICD-10-CM | POA: Diagnosis present

## 2018-06-25 DIAGNOSIS — F319 Bipolar disorder, unspecified: Secondary | ICD-10-CM | POA: Diagnosis present

## 2018-06-25 DIAGNOSIS — F17210 Nicotine dependence, cigarettes, uncomplicated: Secondary | ICD-10-CM | POA: Diagnosis present

## 2018-06-25 DIAGNOSIS — Z79899 Other long term (current) drug therapy: Secondary | ICD-10-CM | POA: Diagnosis not present

## 2018-06-25 DIAGNOSIS — E876 Hypokalemia: Secondary | ICD-10-CM | POA: Diagnosis present

## 2018-06-25 DIAGNOSIS — E039 Hypothyroidism, unspecified: Secondary | ICD-10-CM | POA: Diagnosis present

## 2018-06-25 DIAGNOSIS — Z79890 Hormone replacement therapy (postmenopausal): Secondary | ICD-10-CM | POA: Diagnosis not present

## 2018-06-25 DIAGNOSIS — D649 Anemia, unspecified: Secondary | ICD-10-CM | POA: Diagnosis present

## 2018-06-25 DIAGNOSIS — W1830XA Fall on same level, unspecified, initial encounter: Secondary | ICD-10-CM | POA: Diagnosis present

## 2018-06-25 DIAGNOSIS — F10229 Alcohol dependence with intoxication, unspecified: Secondary | ICD-10-CM | POA: Diagnosis present

## 2018-06-25 DIAGNOSIS — Y92000 Kitchen of unspecified non-institutional (private) residence as  the place of occurrence of the external cause: Secondary | ICD-10-CM | POA: Diagnosis not present

## 2018-06-25 DIAGNOSIS — F4310 Post-traumatic stress disorder, unspecified: Secondary | ICD-10-CM | POA: Diagnosis present

## 2018-06-25 LAB — ETHANOL

## 2018-06-25 LAB — CBC WITH DIFFERENTIAL/PLATELET
Basophils Absolute: 0 10*3/uL (ref 0.0–0.1)
Basophils Relative: 0 %
Eosinophils Absolute: 0.1 10*3/uL (ref 0.0–0.7)
Eosinophils Relative: 1 %
HCT: 34.6 % — ABNORMAL LOW (ref 39.0–52.0)
HEMOGLOBIN: 11.7 g/dL — AB (ref 13.0–17.0)
LYMPHS ABS: 2.5 10*3/uL (ref 0.7–4.0)
Lymphocytes Relative: 30 %
MCH: 24.1 pg — AB (ref 26.0–34.0)
MCHC: 33.8 g/dL (ref 30.0–36.0)
MCV: 71.2 fL — AB (ref 78.0–100.0)
MONOS PCT: 5 %
Monocytes Absolute: 0.4 10*3/uL (ref 0.1–1.0)
NEUTROS ABS: 5.5 10*3/uL (ref 1.7–7.7)
NEUTROS PCT: 64 %
Platelets: 134 10*3/uL — ABNORMAL LOW (ref 150–400)
RBC: 4.86 MIL/uL (ref 4.22–5.81)
RDW: 15.5 % (ref 11.5–15.5)
WBC: 8.6 10*3/uL (ref 4.0–10.5)

## 2018-06-25 LAB — BASIC METABOLIC PANEL
Anion gap: 7 (ref 5–15)
BUN: 16 mg/dL (ref 8–23)
CHLORIDE: 107 mmol/L (ref 98–111)
CO2: 27 mmol/L (ref 22–32)
Calcium: 8.8 mg/dL — ABNORMAL LOW (ref 8.9–10.3)
Creatinine, Ser: 1.16 mg/dL (ref 0.61–1.24)
GFR calc non Af Amer: >60 mL/min (ref >60)
Glucose, Bld: 93 mg/dL (ref 70–99)
Potassium: 3.4 mmol/L — ABNORMAL LOW (ref 3.5–5.1)
SODIUM: 141 mmol/L (ref 135–145)

## 2018-06-25 LAB — PROTIME-INR
INR: 1.14
PROTHROMBIN TIME: 14.5 s (ref 11.4–15.2)

## 2018-06-25 LAB — TYPE AND SCREEN
ABO/RH(D): O POS
ANTIBODY SCREEN: NEGATIVE

## 2018-06-25 LAB — ABO/RH: ABO/RH(D): O POS

## 2018-06-25 MED ORDER — LORAZEPAM 2 MG/ML IJ SOLN: 0.0000 mg | Freq: Four times a day (QID) | INTRAMUSCULAR | Status: AC

## 2018-06-25 MED ORDER — HYDROMORPHONE HCL 1 MG/ML IJ SOLN: 0.5000 mg | INTRAMUSCULAR | Status: AC | PRN

## 2018-06-25 MED ORDER — VITAMIN B-1 100 MG PO TABS
100.0000 mg | ORAL_TABLET | Freq: Every day | ORAL | Status: DC
  Administered 2018-06-25 – 2018-06-30 (×6): 100 mg via ORAL
  Filled 2018-06-25 (×6): qty 1

## 2018-06-25 MED ORDER — LORAZEPAM 2 MG/ML IJ SOLN: 0.0000 mg | Freq: Two times a day (BID) | INTRAMUSCULAR | Status: AC

## 2018-06-25 MED ORDER — SODIUM CHLORIDE 0.9 % IV SOLN
INTRAVENOUS | Status: DC
  Administered 2018-06-26 – 2018-06-29 (×4): via INTRAVENOUS

## 2018-06-25 MED ORDER — FENTANYL CITRATE (PF) 100 MCG/2ML IJ SOLN
50.0000 ug | Freq: Once | INTRAMUSCULAR | Status: AC
  Administered 2018-06-25: 50 ug via INTRAVENOUS
  Filled 2018-06-25: qty 2

## 2018-06-25 MED ORDER — LORAZEPAM 1 MG PO TABS: 0.0000 mg | ORAL_TABLET | Freq: Four times a day (QID) | ORAL | Status: AC

## 2018-06-25 MED ORDER — THIAMINE HCL 100 MG/ML IJ SOLN
100.0000 mg | Freq: Every day | INTRAMUSCULAR | Status: DC
Start: 2018-06-25 — End: 2018-06-30
  Filled 2018-06-25 (×6): qty 1

## 2018-06-25 MED ORDER — ONDANSETRON HCL 4 MG PO TABS: 4.0000 mg | ORAL_TABLET | Freq: Four times a day (QID) | ORAL | Status: DC | PRN

## 2018-06-25 MED ORDER — MORPHINE SULFATE (PF) 2 MG/ML IV SOLN: 1.0000 mg | INTRAVENOUS | Status: DC | PRN

## 2018-06-25 MED ORDER — OXYCODONE HCL 5 MG PO TABS
5.0000 mg | ORAL_TABLET | ORAL | Status: DC | PRN
  Administered 2018-06-25 – 2018-06-26 (×2): 5 mg via ORAL
  Administered 2018-06-27: 10 mg via ORAL
  Administered 2018-06-27: 5 mg via ORAL
  Administered 2018-06-27 – 2018-06-29 (×3): 10 mg via ORAL
  Filled 2018-06-25 (×4): qty 2
  Filled 2018-06-25 (×2): qty 1
  Filled 2018-06-25 (×2): qty 2

## 2018-06-25 MED ORDER — ENOXAPARIN SODIUM 40 MG/0.4ML ~~LOC~~ SOLN
40.0000 mg | Freq: Every day | SUBCUTANEOUS | Status: DC
  Administered 2018-06-25: 40 mg via SUBCUTANEOUS
  Filled 2018-06-25: qty 0.4

## 2018-06-25 MED ORDER — ONDANSETRON HCL 4 MG/2ML IJ SOLN
4.0000 mg | Freq: Four times a day (QID) | INTRAMUSCULAR | Status: DC | PRN
Start: 2018-06-25 — End: 2018-06-30
  Administered 2018-06-25: 4 mg via INTRAVENOUS
  Filled 2018-06-25: qty 2

## 2018-06-25 MED ORDER — ENOXAPARIN SODIUM 30 MG/0.3ML ~~LOC~~ SOLN: 30.0000 mg | Freq: Two times a day (BID) | SUBCUTANEOUS | Status: DC

## 2018-06-25 MED ORDER — LORAZEPAM 1 MG PO TABS
0.0000 mg | ORAL_TABLET | Freq: Two times a day (BID) | ORAL | Status: AC
  Administered 2018-06-27: 2 mg via ORAL
  Filled 2018-06-25: qty 2

## 2018-06-25 MED ORDER — METHOCARBAMOL 500 MG PO TABS: 500.0000 mg | ORAL_TABLET | Freq: Four times a day (QID) | ORAL | Status: DC | PRN

## 2018-06-25 MED ORDER — HYDRALAZINE HCL 20 MG/ML IJ SOLN
10.0000 mg | Freq: Four times a day (QID) | INTRAMUSCULAR | Status: DC | PRN
  Administered 2018-06-26 – 2018-06-28 (×3): 10 mg via INTRAVENOUS
  Filled 2018-06-25 (×2): qty 1

## 2018-06-25 NOTE — Plan of Care (Signed)
Care plan initiated and safety and diet reviewed with pt with a verbalized understanding.

## 2018-06-25 NOTE — Progress Notes (Signed)
Dr. Wynelle Link notified of Pt's arrival to unit room 1306. orders received.

## 2018-06-25 NOTE — ED Notes (Signed)
ED Provider at bedside. 

## 2018-06-25 NOTE — Plan of Care (Signed)
Plan of care discussed.   

## 2018-06-25 NOTE — ED Provider Notes (Signed)
[facility] DEPT Provider Note   CSN: [id number] Arrival date & time: 06/24/18  2358     History   Chief Complaint Chief Complaint  Patient presents with  . Alcohol Intoxication  . Fall  . Leg Injury    HPI Rodney Romero is a 72 y.o. male.  The history is provided by the patient.  Alcohol Intoxication  This is a new problem. The current episode started 3 to 5 hours ago. The problem occurs constantly. The problem has been gradually worsening. Pertinent negatives include no chest pain, no abdominal pain and no headaches. The symptoms are aggravated by walking. The symptoms are relieved by rest.  Fall  This is a new problem. Episode onset: Just prior to arrival. The problem occurs constantly. The problem has been gradually worsening. Pertinent negatives include no chest pain, no abdominal pain and no headaches. The symptoms are aggravated by walking. The symptoms are relieved by rest.   Patient presents after fall. He admits to alcohol use several hours ago.  He did take a sleeping pill at approximately 2100. He got up to get some the knee, and felt dizzy and fell.  He reports he landed on his left leg and it hurts.  Denies head injury or headache.  Unclear LOC.  No neck or back pain.  No chest pain or abdominal pain. Past Medical History:  Diagnosis Date  . Bipolar 1 disorder ([place])   . Hypertension   . Hypothyroidism   . PTSD (post-traumatic stress disorder)     There are no active problems to display for this patient.   Past Surgical History:  Procedure Laterality Date  . GALLBLADDER SURGERY          Home Medications    Prior to Admission medications   Medication Sig Start Date End Date Taking? Authorizing Provider  AMLODIPINE BESYLATE PO Take by mouth.    [provider]  aspirin EC 81 MG tablet Take 81 mg by mouth daily.    [provider]  Cyanocobalamin (VITAMIN B-12 PO) Take 1 tablet by mouth daily.     [provider]  ferrous sulfate 325 (65 FE) MG tablet Take 325 mg by mouth 2 (two) times daily with a meal.    [provider]  LEVOTHYROXINE SODIUM PO Take 1 tablet by mouth daily before breakfast.     [provider]  MELATONIN PO Take 1 tablet by mouth at bedtime.    [provider]  naproxen (NAPROSYN) 500 MG tablet Take 1 tablet (500 mg total) by mouth 2 (two) times daily with a meal. 4/0/98   Delora Fuel, MD  PRESCRIPTION MEDICATION Take 1 tablet by mouth daily. For memory    [provider]    Family History History reviewed. No pertinent family history.  Social History Social History   Tobacco Use  . Smoking status: Current Every Day Smoker    Packs/day: 0.50    Types: Cigarettes    Last attempt to quit: 09/03/2014    Years since quitting: 3.8  . Smokeless tobacco: Never Used  Substance Use Topics  . Alcohol use: Yes    Comment: Everyother day   . Drug use: No     Allergies   Patient has no known allergies.   Review of Systems Review of Systems  Constitutional: Negative for fever.  Cardiovascular: Negative for chest pain.  Gastrointestinal: Negative for abdominal pain.  Musculoskeletal: Positive for arthralgias. Negative for back pain and neck  pain.  Neurological: Negative for headaches.  All other systems reviewed and are negative.    Physical Exam Updated Vital Signs BP 136/75 (BP Location: Right Arm)   Pulse (!) 48   Temp (!) 97.5 F (36.4 C) (Oral)   Resp 12   Ht 1.702 m (5\' 7" )   Wt 61.2 kg (135 lb)   SpO2 95%   BMI 21.14 kg/m   Physical Exam CONSTITUTIONAL: Disheveled, no acute distress HEAD: Normocephalic/atraumatic EYES: EOMI/PERRL ENMT: Mucous membranes moist NECK: supple no meningeal signs SPINE/BACK:entire spine nontender, no bruising/crepitance/stepoffs noted to spine CV: S1/S2 noted, no murmurs/rubs/gallops noted LUNGS: Lungs are clear to auscultation bilaterally, no apparent  distress Chest-no tenderness of bruising ABDOMEN: soft, nontender GU:no cva tenderness NEURO: Pt is awake/alert/appropriate, moves all extremitiesx4.  EXTREMITIES: pulses normal/equalx4, deformity noted to left tibia.  No lacerations.  Distal pulses intact.  There is no ankle or left foot tenderness.  Minimal tenderness to left knee.  There is no thigh tenderness.  Pelvis is stable. SKIN: warm, color normal PSYCH: no abnormalities of mood noted, alert and oriented to situation   ED Treatments / Results  Labs (all labs ordered are listed, but only abnormal results are displayed) Labs Reviewed  BASIC METABOLIC PANEL - Abnormal; Notable for the following components:      Result Value   Potassium 3.4 (*)    Calcium 8.8 (*)    All other components within normal limits  CBC WITH DIFFERENTIAL/PLATELET - Abnormal; Notable for the following components:   Hemoglobin 11.7 (*)    HCT 34.6 (*)    MCV 71.2 (*)    MCH 24.1 (*)    Platelets 134 (*)    All other components within normal limits  PROTIME-INR  ETHANOL  TYPE AND SCREEN  ABO/RH    EKG EKG Interpretation  Date/Time:  Tuesday June 25 2018 00:10:52 EDT Ventricular Rate:  51 PR Interval:    QRS Duration: 82 QT Interval:  569 QTC Calculation: 525 R Axis:   64 Text Interpretation:  Sinus rhythm Borderline abnrm T, anterolateral leads Prolonged QT interval Confirmed by Ripley Fraise [phone]) on 06/25/2018 1:03:42 AM   Radiology Dg Tibia/fibula Left  Result Date: 06/25/2018 CLINICAL DATA:  Twisting injury from a fall.  Deformity. EXAM: LEFT TIBIA AND FIBULA - 2 VIEW COMPARISON:  None. FINDINGS: Spiral oblique fracture demonstrated in the distal left tibial shaft. Mild anterior angulation of the distal fracture fragment. 8 mm lateral displacement of the distal fracture fragment. Comminuted oblique fractures of the proximal left fibular shaft without significant displacement. Degenerative changes in the knee. Ankle joint appears  intact. IMPRESSION: Spiral oblique fracture of the distal left tibial shaft. Comminuted oblique fractures of the proximal left fibular shaft. Electronically Signed   By: Lucienne Capers M.D.   On: 06/25/2018 01:19   Dg Pelvis Portable  Result Date: 06/25/2018 CLINICAL DATA:  Pelvic pain after a fall. EXAM: PORTABLE PELVIS 1-2 VIEWS COMPARISON:  CT abdomen and pelvis 12/18/2013 FINDINGS: There is no evidence of pelvic fracture or diastasis. No pelvic bone lesions are seen. IMPRESSION: Negative. Electronically Signed   By: Lucienne Capers M.D.   On: 06/25/2018 02:04   Dg Chest Port 1 View  Result Date: 06/25/2018 CLINICAL DATA:  Patient fell.  Pain.  Tib-fib fractures. EXAM: PORTABLE CHEST 1 VIEW COMPARISON:  03/06/2018 FINDINGS: Heart size and pulmonary vascularity are normal. Lungs are clear. No airspace disease or consolidation. No blunting of costophrenic angles. No pneumothorax. Prominent soft tissue in the  right paratracheal region probably represents prominent vascular structures. Similar appearance to previous study. Calcification of the aorta. IMPRESSION: No active disease. Electronically Signed   By: Lucienne Capers M.D.   On: 06/25/2018 02:04    Procedures Procedures  SPLINT APPLICATION Date/Time: 3976 Authorized by: Sharyon Cable Consent: Verbal consent obtained. Risks and benefits: risks, benefits and alternatives were discussed Consent given by: patient Splint applied by: orthopedic technician Location details:left leg Splint type: posterior Supplies used: ortho glass Post-procedure: The splinted body part was neurovascularly unchanged following the procedure. Patient tolerance: Patient tolerated the procedure well with no immediate complications.    Medications Ordered in ED Medications  LORazepam (ATIVAN) injection 0-4 mg (has no administration in time range)    Or  LORazepam (ATIVAN) tablet 0-4 mg (has no administration in time range)  LORazepam (ATIVAN)  injection 0-4 mg (has no administration in time range)    Or  LORazepam (ATIVAN) tablet 0-4 mg (has no administration in time range)  thiamine (VITAMIN B-1) tablet 100 mg (has no administration in time range)    Or  thiamine (B-1) injection 100 mg (has no administration in time range)  HYDROmorphone (DILAUDID) injection 0.5 mg (has no administration in time range)  fentaNYL (SUBLIMAZE) injection 50 mcg (50 mcg Intravenous Given 06/25/18 0121)     Initial Impression / Assessment and Plan / ED Course  I have reviewed the triage vital signs and the nursing notes.  Pertinent labs & imaging results that were available during my care of the patient were reviewed by me and considered in my medical decision making (see chart for details).     1:12 AM Presents after fall due to intoxication.  He appears to have significant injury to his left tib-fib.  He would need to be admitted. 2:39 AM He presents after a fall and sustained a significant fracture to left tibia.  There is a closed injury, neurovascular intact He is improved after splint.  No other acute trauma noted.  No signs of head injury.  I discussed the case with Dr. Wynelle Link he will admit patient.  Due to alcoholism, will place him on the [facility] protocol Final Clinical Impressions(s) / ED Diagnoses   Final diagnoses:  Other closed fracture of distal end of left tibia, initial encounter    ED Discharge Orders    None       Ripley Fraise, MD 06/25/18 [phone]

## 2018-06-25 NOTE — ED Notes (Signed)
ED TO INPATIENT HANDOFF REPORT  Name/Age/Gender Rodney Romero 72 y.o. male  Code Status   Home/SNF/Other Home  Chief Complaint ETOH/Sleeping/ fall   Level of Care/Admitting Diagnosis ED Disposition    ED Disposition Condition [facility] Area: [facility] [[id number]]  Level of Care: Med-Surg [16]  Diagnosis: Closed tibia fracture [220254]  Admitting Physician: [place], [place]  Attending Physician: Gaynelle Arabian [[id number]]  Estimated length of stay: past midnight tomorrow  Certification:: I certify this patient will need inpatient services for at least 2 midnights  PT Class (Do Not Modify): Inpatient [101]  PT Acc Code (Do Not Modify): Private [1]       Medical History Past Medical History:  Diagnosis Date  . Bipolar 1 disorder ([place])   . Hypertension   . Hypothyroidism   . PTSD (post-traumatic stress disorder)     Allergies No Known Allergies  IV Location/Drains/Wounds Patient Lines/Drains/Airways Status   Active Line/Drains/Airways    Name:   Placement date:   Placement time:   Site:   Days:   Peripheral IV Left Antecubital   -    -    Antecubital             Labs/Imaging Results for orders placed or performed during the hospital encounter of 06/24/18 (from the past 48 hour(s))  Basic metabolic panel     Status: Abnormal   Collection Time: 06/25/18  1:01 AM  Result Value Ref Range   Sodium 141 135 - 145 mmol/L   Potassium 3.4 (L) 3.5 - 5.1 mmol/L   Chloride 107 98 - 111 mmol/L   CO2 27 22 - 32 mmol/L   Glucose, Bld 93 70 - 99 mg/dL   BUN 16 8 - 23 mg/dL   Creatinine, Ser 1.16 0.61 - 1.24 mg/dL   Calcium 8.8 (L) 8.9 - 10.3 mg/dL   GFR calc non Af Amer >60 >60 mL/min   GFR calc Af Amer >60 >60 mL/min    Comment: (NOTE) The eGFR has been calculated using the CKD EPI equation. This calculation has not been validated in all clinical situations. eGFR's persistently <60 mL/min signify possible Chronic  Kidney Disease.    Anion gap 7 5 - 15    Comment: Performed at [facility], [place] [address], [place], [place] [address]  CBC WITH DIFFERENTIAL     Status: Abnormal   Collection Time: 06/25/18  1:01 AM  Result Value Ref Range   WBC 8.6 4.0 - 10.5 K/uL   RBC 4.86 4.22 - 5.81 MIL/uL   Hemoglobin 11.7 (L) 13.0 - 17.0 g/dL   HCT 34.6 (L) 39.0 - 52.0 %   MCV 71.2 (L) 78.0 - 100.0 fL   MCH 24.1 (L) 26.0 - 34.0 pg   MCHC 33.8 30.0 - 36.0 g/dL   RDW 15.5 11.5 - 15.5 %   Platelets 134 (L) 150 - 400 K/uL   Neutrophils Relative % 64 %   Neutro Abs 5.5 1.7 - 7.7 K/uL   Lymphocytes Relative 30 %   Lymphs Abs 2.5 0.7 - 4.0 K/uL   Monocytes Relative 5 %   Monocytes Absolute 0.4 0.1 - 1.0 K/uL   Eosinophils Relative 1 %   Eosinophils Absolute 0.1 0.0 - 0.7 K/uL   Basophils Relative 0 %   Basophils Absolute 0.0 0.0 - 0.1 K/uL    Comment: Performed at [facility], [place] [address] [place], [place] [address]  Protime-INR  Status: None   Collection Time: 06/25/18  1:01 AM  Result Value Ref Range   Prothrombin Time 14.5 11.4 - 15.2 seconds   INR 1.14     Comment: Performed at [facility], [place] [address] [place], [place] [address]  Type and screen [place]     Status: None   Collection Time: 06/25/18  1:01 AM  Result Value Ref Range   ABO/RH(D) O POS    Antibody Screen NEG    Sample Expiration      06/28/2018 Performed at [facility], [place] [address] [place], [place] [address]   Ethanol     Status: None   Collection Time: 06/25/18  1:26 AM  Result Value Ref Range   Alcohol, Ethyl (B) <10 <10 mg/dL    Comment: (NOTE) Lowest detectable limit for serum alcohol is 10 mg/dL. For medical purposes only. Performed at [facility], [place] [address], [place], [place] [address]    Dg Tibia/fibula Left  Result Date: 06/25/2018 CLINICAL DATA:  Twisting injury from a fall.   Deformity. EXAM: LEFT TIBIA AND FIBULA - 2 VIEW COMPARISON:  None. FINDINGS: Spiral oblique fracture demonstrated in the distal left tibial shaft. Mild anterior angulation of the distal fracture fragment. 8 mm lateral displacement of the distal fracture fragment. Comminuted oblique fractures of the proximal left fibular shaft without significant displacement. Degenerative changes in the knee. Ankle joint appears intact. IMPRESSION: Spiral oblique fracture of the distal left tibial shaft. Comminuted oblique fractures of the proximal left fibular shaft. Electronically Signed   By: Lucienne Capers M.D.   On: 06/25/2018 01:19   Dg Pelvis Portable  Result Date: 06/25/2018 CLINICAL DATA:  Pelvic pain after a fall. EXAM: PORTABLE PELVIS 1-2 VIEWS COMPARISON:  CT abdomen and pelvis 12/18/2013 FINDINGS: There is no evidence of pelvic fracture or diastasis. No pelvic bone lesions are seen. IMPRESSION: Negative. Electronically Signed   By: Lucienne Capers M.D.   On: 06/25/2018 02:04   Dg Chest Port 1 View  Result Date: 06/25/2018 CLINICAL DATA:  Patient fell.  Pain.  Tib-fib fractures. EXAM: PORTABLE CHEST 1 VIEW COMPARISON:  03/06/2018 FINDINGS: Heart size and pulmonary vascularity are normal. Lungs are clear. No airspace disease or consolidation. No blunting of costophrenic angles. No pneumothorax. Prominent soft tissue in the right paratracheal region probably represents prominent vascular structures. Similar appearance to previous study. Calcification of the aorta. IMPRESSION: No active disease. Electronically Signed   By: Lucienne Capers M.D.   On: 06/25/2018 02:04    Pending Labs Unresulted Labs (From admission, onward)   Start     Ordered   06/25/18 0101  ABO/Rh  Once,   R     06/25/18 0101      Vitals/Pain Today's Vitals   06/25/18 0101 06/25/18 0130 06/25/18 0200 06/25/18 0255  BP:  (!) 146/78 (!) 121/59   Pulse:  (!) 51 63   Resp:  12 13   Temp:      TempSrc:      SpO2:  96% 95%    Weight:      Height:      PainSc: 7    0-No pain    Isolation Precautions No active isolations  Medications Medications  LORazepam (ATIVAN) injection 0-4 mg (has no administration in time range)    Or  LORazepam (ATIVAN) tablet 0-4 mg (has no administration in time range)  LORazepam (ATIVAN) injection 0-4 mg (has no administration in time range)  Or  LORazepam (ATIVAN) tablet 0-4 mg (has no administration in time range)  thiamine (VITAMIN B-1) tablet 100 mg (has no administration in time range)    Or  thiamine (B-1) injection 100 mg (has no administration in time range)  HYDROmorphone (DILAUDID) injection 0.5 mg (has no administration in time range)  fentaNYL (SUBLIMAZE) injection 50 mcg (50 mcg Intravenous Given 06/25/18 0121)    Mobility walks

## 2018-06-25 NOTE — H&P (Signed)
HISTORY & PHYSICAL  Patient ID: Rodney Romero MRN: [id number] DOB/AGE: 07-23-1946 72 y.o.  Admit date: 06/24/2018  Chief Complaint: left leg pain  HPI:  Rodney Romero is a 72 y/o male with PMH significant for hypertension, hypothyroidism, and bipolar 1 disorder who presented to the [facility] Emergency Department on 06/24/2018 with left leg injury and alcohol intoxication. Pt states he fell in his kitchen after drinking approximately half of a 5% smirnoff bottle and taking a sleeping pill a few hours prior. Reports he felt dizzy upon standing and fell directly onto his left leg, which immediately began hurting. He was unable to bear weight after the fall due to pain in the left, so he crawled to the bedroom and called his wife, who brought him to the emergency department. Denies hitting head or LOC. He states he does not have dizzy spells like this often, attributes this to the sleeping pill he took. 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 He is also currently a 1/2 pack a day smoker. States he is having no symptoms other than left lower leg pain. Denies chest pain, SOB, headache, neck or back pain, abdominal pain, tremors or anxiety. Pt was found to have a left distal tibial and left proximal fibular fracture upon x-ray and workup in the emergency department was significant for hypokalemia (3.4), low hemoglobin (11.7), and low platelets (134K). A posterior splint was applied to the left leg and Dr. Wynelle Link was consulted. Patient was promptly admitted to the orthopedic floor.   Allergies: No Known Allergies   Medications: Medications Prior to Admission  Medication Sig Dispense Refill Last Dose  . aspirin EC 81 MG tablet Take 81 mg by mouth daily.   06/24/2018 at Unknown time  . Cyanocobalamin (VITAMIN B-12 PO) Take 1 tablet by mouth daily.   06/24/2018 at Unknown time  . ferrous sulfate 325 (65  FE) MG tablet Take 325 mg by mouth 2 (two) times daily with a meal.   06/24/2018 at Unknown time  . AMLODIPINE BESYLATE PO Take by mouth.   03/06/2018 at Unknown time  . LEVOTHYROXINE SODIUM PO Take 1 tablet by mouth daily before breakfast.    03/06/2018 at Unknown time  . MELATONIN PO Take 1 tablet by mouth at bedtime.   Past Week at Unknown time  . naproxen (NAPROSYN) 500 MG tablet Take 1 tablet (500 mg total) by mouth 2 (two) times daily with a meal. 30 tablet 0   . PRESCRIPTION MEDICATION Take 1 tablet by mouth daily. For memory   09/04/2015 at Unknown time    Past Medical History: Past Medical History:  Diagnosis Date  . Bipolar 1 disorder ([place])   . Hypertension   . Hypothyroidism   . PTSD (post-traumatic stress disorder)      Past Surgical History: Past Surgical History:  Procedure Laterality Date  . GALLBLADDER SURGERY       Family History: History reviewed. No pertinent family history.  Social History: Social History   Tobacco Use  . Smoking status: Current Every Day Smoker    Packs/day: 0.50    Types: Cigarettes    Last attempt to quit: 09/03/2014    Years since quitting: 3.8  . Smokeless tobacco: Never Used  Substance Use Topics  . Alcohol use: Yes    Comment: Everyother day      Review of Systems Constitutional: negative for chills, fevers and  sweats Respiratory: negative for cough, wheezing and shortness of breath Cardiovascular: negative for chest pain Gastrointestinal: negative for abdominal pain Musculoskeletal:positive for left lower leg pain Neurological: negative for headaches  Physical Exam:  Vitals: Pulse:74 bpm Respirations:12 bpm Blood Pressure:126/71 mmHg  General: alert and no distress HENT:Head: Normal, normocephalic, atraumatic. Neck:supple Chest:clear to auscultation, no wheezes, rales or rhonchi, symmetric air entry Heart:S1, S2 normal, no murmur, rub or gallop, regular rate and rhythm Abdomen:abdomen soft and  non-tender Musculoskeletal: Left lower extremity: Neurologically intact Neurovascular intact Sensation intact distally Intact pulses distally Dorsiflexion/Plantar flexion intact Compartment soft  Tenderness to palpation about the lateral left knee and mid-tibia.   LABS: Results for orders placed or performed during the hospital encounter of 63/01/60  Basic metabolic panel  Result Value Ref Range   Sodium 141 135 - 145 mmol/L   Potassium 3.4 (L) 3.5 - 5.1 mmol/L   Chloride 107 98 - 111 mmol/L   CO2 27 22 - 32 mmol/L   Glucose, Bld 93 70 - 99 mg/dL   BUN 16 8 - 23 mg/dL   Creatinine, Ser 1.16 0.61 - 1.24 mg/dL   Calcium 8.8 (L) 8.9 - 10.3 mg/dL   GFR calc non Af Amer >60 >60 mL/min   GFR calc Af Amer >60 >60 mL/min   Anion gap 7 5 - 15  CBC WITH DIFFERENTIAL  Result Value Ref Range   WBC 8.6 4.0 - 10.5 K/uL   RBC 4.86 4.22 - 5.81 MIL/uL   Hemoglobin 11.7 (L) 13.0 - 17.0 g/dL   HCT 34.6 (L) 39.0 - 52.0 %   MCV 71.2 (L) 78.0 - 100.0 fL   MCH 24.1 (L) 26.0 - 34.0 pg   MCHC 33.8 30.0 - 36.0 g/dL   RDW 15.5 11.5 - 15.5 %   Platelets 134 (L) 150 - 400 K/uL   Neutrophils Relative % 64 %   Neutro Abs 5.5 1.7 - 7.7 K/uL   Lymphocytes Relative 30 %   Lymphs Abs 2.5 0.7 - 4.0 K/uL   Monocytes Relative 5 %   Monocytes Absolute 0.4 0.1 - 1.0 K/uL   Eosinophils Relative 1 %   Eosinophils Absolute 0.1 0.0 - 0.7 K/uL   Basophils Relative 0 %   Basophils Absolute 0.0 0.0 - 0.1 K/uL  Protime-INR  Result Value Ref Range   Prothrombin Time 14.5 11.4 - 15.2 seconds   INR 1.14   Ethanol  Result Value Ref Range   Alcohol, Ethyl (B) <10 <10 mg/dL  Type and screen [place]  Result Value Ref Range   ABO/RH(D) O POS    Antibody Screen NEG    Sample Expiration      06/28/2018 Performed at [facility], [address] [place], [place] [address]    Recent Labs    06/25/18 0101  HGB 11.7*   Recent Labs    06/25/18 0101  WBC 8.6  RBC 4.86   HCT 34.6*  PLT 134*   Recent Labs    06/25/18 0101  NA 141  K 3.4*  CL 107  CO2 27  BUN 16  CREATININE 1.16  GLUCOSE 93  CALCIUM 8.8*   Recent Labs    06/25/18 0101  INR 1.14    Assessment/Plan: 1. Left distal tibial spiral fracture, closed 2. Left proximal fibular comminuted oblique fracture, closed 3. Alcohol intoxication   The patient is being admitted to [facility] to undergo a left tibial IM nail.  Surgery will be  performed by Dr. Gaynelle Arabian.  It was discussed with patient that the two treatment options would be placement of a long leg cast with non-weightbearing status for approximately 3 months versus surgery with quicker return to weight-bearing status. Risks and benefits of both options have been discussed with the patient and they elect to proceed with the surgical procedure. CIWA protocol started by emergency department provider due to alcoholism. DVT prophylaxis of 40 mg Lovenox QD began, will hold tomorrow.

## 2018-06-25 NOTE — Progress Notes (Signed)
Report received from [facility] RN/ED, pt arrived to unit via stretcher and transferred to bed without difficulty. Education initiated with pt on call bell and plan of care. Verbalized understanding.

## 2018-06-25 NOTE — ED Triage Notes (Signed)
Pt presents by [facility] for evaluation of left leg injury after falling when drinking alcohol and taking prescribed sleep aid. Pt given 561ml bolus of NS and 119mcg Fentanyl through IV. EMS reports that pt had no LOC. Pt reports left lower leg pain.

## 2018-06-25 NOTE — ED Notes (Signed)
Pt reports taking sleeping pill around 5883 and last alcoholic drink 2549. Pt reports he fell when going to get something to eat and reaching into cabinet. No intention of self harm. Pt denies any SI/HI.

## 2018-06-26 ENCOUNTER — Inpatient Hospital Stay (HOSPITAL_COMMUNITY): Payer: Medicare Other

## 2018-06-26 ENCOUNTER — Encounter (HOSPITAL_COMMUNITY): Payer: Self-pay | Admitting: Certified Registered"

## 2018-06-26 ENCOUNTER — Inpatient Hospital Stay (HOSPITAL_COMMUNITY): Payer: Medicare Other | Admitting: Registered Nurse

## 2018-06-26 ENCOUNTER — Encounter (HOSPITAL_COMMUNITY)
Admission: EM | Disposition: A | Discharge status: Home Health | Payer: Self-pay | Source: Home / Self Care | Attending: Orthopedic Surgery

## 2018-06-26 HISTORY — PX: TIBIA IM NAIL INSERTION: SHX2516

## 2018-06-26 LAB — SURGICAL PCR SCREEN
MRSA, PCR: NEGATIVE
STAPHYLOCOCCUS AUREUS: POSITIVE — AB

## 2018-06-26 SURGERY — INSERTION, INTRAMEDULLARY ROD, TIBIA
Anesthesia: General | Site: Leg Lower | Laterality: Left

## 2018-06-26 MED ORDER — VITAMIN B-12 1000 MCG PO TABS
1000.0000 ug | ORAL_TABLET | Freq: Every day | ORAL | Status: DC
  Administered 2018-06-27 – 2018-06-30 (×4): 1000 ug via ORAL
  Filled 2018-06-26 (×4): qty 1

## 2018-06-26 MED ORDER — OXYCODONE HCL 5 MG PO TABS: 5.0000 mg | ORAL_TABLET | Freq: Once | ORAL | Status: DC | PRN

## 2018-06-26 MED ORDER — METOCLOPRAMIDE HCL 5 MG PO TABS: 5.0000 mg | ORAL_TABLET | Freq: Three times a day (TID) | ORAL | Status: DC | PRN

## 2018-06-26 MED ORDER — METOCLOPRAMIDE HCL 5 MG/ML IJ SOLN: 5.0000 mg | Freq: Three times a day (TID) | INTRAMUSCULAR | Status: DC | PRN

## 2018-06-26 MED ORDER — OXYCODONE HCL 5 MG/5ML PO SOLN
5.0000 mg | Freq: Once | ORAL | Status: DC | PRN
  Filled 2018-06-26: qty 5

## 2018-06-26 MED ORDER — ONDANSETRON HCL 4 MG/2ML IJ SOLN
INTRAMUSCULAR | Status: DC | PRN
  Administered 2018-06-26: 4 mg via INTRAVENOUS

## 2018-06-26 MED ORDER — TRAZODONE HCL 50 MG PO TABS: 50.0000 mg | ORAL_TABLET | Freq: Every evening | ORAL | Status: DC | PRN

## 2018-06-26 MED ORDER — LEVOTHYROXINE SODIUM 25 MCG PO TABS
137.0000 ug | ORAL_TABLET | Freq: Every day | ORAL | Status: DC
  Administered 2018-06-27 – 2018-06-30 (×4): 137 ug via ORAL
  Filled 2018-06-26 (×4): qty 1

## 2018-06-26 MED ORDER — LIDOCAINE 2% (20 MG/ML) 5 ML SYRINGE
INTRAMUSCULAR | Status: DC | PRN
  Administered 2018-06-26: 100 mg via INTRAVENOUS

## 2018-06-26 MED ORDER — AMLODIPINE BESYLATE 5 MG PO TABS
5.0000 mg | ORAL_TABLET | Freq: Every day | ORAL | Status: DC
  Administered 2018-06-26 – 2018-06-30 (×5): 5 mg via ORAL
  Filled 2018-06-26 (×5): qty 1

## 2018-06-26 MED ORDER — FERROUS SULFATE 325 (65 FE) MG PO TABS
325.0000 mg | ORAL_TABLET | ORAL | Status: DC
  Administered 2018-06-26 – 2018-06-28 (×3): 325 mg via ORAL
  Filled 2018-06-26 (×3): qty 1

## 2018-06-26 MED ORDER — LIDOCAINE 2% (20 MG/ML) 5 ML SYRINGE
INTRAMUSCULAR | Status: AC
  Filled 2018-06-26: qty 5

## 2018-06-26 MED ORDER — DEXAMETHASONE SODIUM PHOSPHATE 10 MG/ML IJ SOLN
INTRAMUSCULAR | Status: DC | PRN
  Administered 2018-06-26: 8 mg via INTRAVENOUS

## 2018-06-26 MED ORDER — SUGAMMADEX SODIUM 200 MG/2ML IV SOLN
INTRAVENOUS | Status: AC
  Filled 2018-06-26: qty 2

## 2018-06-26 MED ORDER — GALANTAMINE HYDROBROMIDE ER 8 MG PO CP24
8.0000 mg | ORAL_CAPSULE | Freq: Every day | ORAL | Status: DC
Start: 2018-06-27 — End: 2018-06-30
  Administered 2018-06-27 – 2018-06-30 (×4): 8 mg via ORAL
  Filled 2018-06-26 (×4): qty 1

## 2018-06-26 MED ORDER — 0.9 % SODIUM CHLORIDE (POUR BTL) OPTIME
TOPICAL | Status: DC | PRN
  Administered 2018-06-26: 1000 mL

## 2018-06-26 MED ORDER — ROCURONIUM BROMIDE 10 MG/ML (PF) SYRINGE
PREFILLED_SYRINGE | INTRAVENOUS | Status: AC
  Filled 2018-06-26: qty 10

## 2018-06-26 MED ORDER — LACTATED RINGERS IV SOLN
INTRAVENOUS | Status: DC
  Administered 2018-06-26: 15:00:00 via INTRAVENOUS

## 2018-06-26 MED ORDER — HYDROMORPHONE HCL 1 MG/ML IJ SOLN: 0.2500 mg | INTRAMUSCULAR | Status: DC | PRN

## 2018-06-26 MED ORDER — FENTANYL CITRATE (PF) 250 MCG/5ML IJ SOLN
INTRAMUSCULAR | Status: AC
  Filled 2018-06-26: qty 5

## 2018-06-26 MED ORDER — SODIUM CHLORIDE 0.9 % IV SOLN
INTRAVENOUS | Status: AC
  Filled 2018-06-26: qty 500000

## 2018-06-26 MED ORDER — ENOXAPARIN SODIUM 40 MG/0.4ML ~~LOC~~ SOLN
40.0000 mg | SUBCUTANEOUS | Status: DC
  Administered 2018-06-27 – 2018-06-30 (×4): 40 mg via SUBCUTANEOUS
  Filled 2018-06-26 (×4): qty 0.4

## 2018-06-26 MED ORDER — FENTANYL CITRATE (PF) 100 MCG/2ML IJ SOLN
INTRAMUSCULAR | Status: DC | PRN
  Administered 2018-06-26 (×5): 50 ug via INTRAVENOUS

## 2018-06-26 MED ORDER — DOCUSATE SODIUM 100 MG PO CAPS
100.0000 mg | ORAL_CAPSULE | Freq: Two times a day (BID) | ORAL | Status: DC
  Administered 2018-06-26 – 2018-06-30 (×8): 100 mg via ORAL
  Filled 2018-06-26 (×8): qty 1

## 2018-06-26 MED ORDER — PANTOPRAZOLE SODIUM 40 MG PO TBEC
40.0000 mg | DELAYED_RELEASE_TABLET | Freq: Every day | ORAL | Status: DC
  Administered 2018-06-27 – 2018-06-30 (×4): 40 mg via ORAL
  Filled 2018-06-26 (×4): qty 1

## 2018-06-26 MED ORDER — PROMETHAZINE HCL 25 MG/ML IJ SOLN: 6.2500 mg | INTRAMUSCULAR | Status: DC | PRN

## 2018-06-26 MED ORDER — CEFAZOLIN SODIUM-DEXTROSE 2-4 GM/100ML-% IV SOLN
2.0000 g | Freq: Once | INTRAVENOUS | Status: AC
  Administered 2018-06-26: 2 g via INTRAVENOUS
  Filled 2018-06-26: qty 100

## 2018-06-26 MED ORDER — PROPOFOL 10 MG/ML IV BOLUS
INTRAVENOUS | Status: DC | PRN
  Administered 2018-06-26: 140 mg via INTRAVENOUS

## 2018-06-26 MED ORDER — ROCURONIUM BROMIDE 10 MG/ML (PF) SYRINGE
PREFILLED_SYRINGE | INTRAVENOUS | Status: DC | PRN
  Administered 2018-06-26: 40 mg via INTRAVENOUS

## 2018-06-26 MED ORDER — PROPOFOL 10 MG/ML IV BOLUS
INTRAVENOUS | Status: AC
  Filled 2018-06-26: qty 20

## 2018-06-26 MED ORDER — MIDAZOLAM HCL 2 MG/2ML IJ SOLN
INTRAMUSCULAR | Status: AC
Start: 2018-06-26 — End: ?
  Filled 2018-06-26: qty 2

## 2018-06-26 MED ORDER — CEFAZOLIN SODIUM-DEXTROSE 2-4 GM/100ML-% IV SOLN
2.0000 g | Freq: Four times a day (QID) | INTRAVENOUS | Status: AC
  Administered 2018-06-26 – 2018-06-27 (×2): 2 g via INTRAVENOUS
  Filled 2018-06-26 (×2): qty 100

## 2018-06-26 MED ORDER — MIRTAZAPINE 15 MG PO TABS
15.0000 mg | ORAL_TABLET | Freq: Every day | ORAL | Status: DC
  Administered 2018-06-26 – 2018-06-29 (×4): 15 mg via ORAL
  Filled 2018-06-26 (×4): qty 1

## 2018-06-26 MED ORDER — MIDAZOLAM HCL 5 MG/5ML IJ SOLN
INTRAMUSCULAR | Status: DC | PRN
  Administered 2018-06-26 (×2): 1 mg via INTRAVENOUS

## 2018-06-26 SURGICAL SUPPLY — 44 items
BAG ZIPLOCK 12X15 (MISCELLANEOUS) ×3 IMPLANT
BANDAGE ELASTIC 4 VELCRO ST LF (GAUZE/BANDAGES/DRESSINGS) ×3 IMPLANT
BANDAGE ELASTIC 6 VELCRO ST LF (GAUZE/BANDAGES/DRESSINGS) ×3 IMPLANT
BIT DRILL 3.8X6 NS (BIT) ×3 IMPLANT
BIT DRILL 4.4 NS (BIT) ×3 IMPLANT
COVER SURGICAL LIGHT HANDLE (MISCELLANEOUS) ×3 IMPLANT
CUFF TOURN SGL QUICK 34 (TOURNIQUET CUFF) ×2
CUFF TRNQT CYL 34X4X40X1 (TOURNIQUET CUFF) ×1 IMPLANT
DRAPE C-ARM 42X120 X-RAY (DRAPES) ×3 IMPLANT
DRAPE U-SHAPE 47X51 STRL (DRAPES) ×3 IMPLANT
DRSG ADAPTIC 3X8 NADH LF (GAUZE/BANDAGES/DRESSINGS) ×3 IMPLANT
DRSG EMULSION OIL 3X3 NADH (GAUZE/BANDAGES/DRESSINGS) ×3 IMPLANT
DRSG PAD ABDOMINAL 8X10 ST (GAUZE/BANDAGES/DRESSINGS) ×3 IMPLANT
DURAPREP 26ML APPLICATOR (WOUND CARE) ×3 IMPLANT
ELECT REM PT RETURN 15FT ADLT (MISCELLANEOUS) ×3 IMPLANT
GAUZE SPONGE 4X4 12PLY STRL (GAUZE/BANDAGES/DRESSINGS) ×3 IMPLANT
GLOVE BIO SURGEON STRL SZ7.5 (GLOVE) ×6 IMPLANT
GLOVE BIOGEL PI IND STRL 7.0 (GLOVE) ×1 IMPLANT
GLOVE BIOGEL PI IND STRL 8 (GLOVE) ×1 IMPLANT
GLOVE BIOGEL PI INDICATOR 7.0 (GLOVE) ×2
GLOVE BIOGEL PI INDICATOR 8 (GLOVE) ×2
GLOVE SURG SS PI 7.0 STRL IVOR (GLOVE) ×3 IMPLANT
GOWN STRL REUS W/TWL LRG LVL3 (GOWN DISPOSABLE) ×6 IMPLANT
GOWN STRL REUS W/TWL XL LVL3 (GOWN DISPOSABLE) ×3 IMPLANT
GUIDEPIN 3.2X17.5 THRD DISP (PIN) ×3 IMPLANT
GUIDEWIRE BALL NOSE 80CM (WIRE) ×3 IMPLANT
MANIFOLD NEPTUNE II (INSTRUMENTS) ×3 IMPLANT
NAIL TIBIAL 11MMX36CM (Nail) ×3 IMPLANT
NS IRRIG 1000ML POUR BTL (IV SOLUTION) ×3 IMPLANT
PACK TOTAL JOINT (CUSTOM PROCEDURE TRAY) ×3 IMPLANT
PAD ABD 8X10 STRL (GAUZE/BANDAGES/DRESSINGS) ×3 IMPLANT
PAD CAST 4YDX4 CTTN HI CHSV (CAST SUPPLIES) ×1 IMPLANT
PADDING CAST COTTON 4X4 STRL (CAST SUPPLIES) ×2
PADDING CAST COTTON 6X4 STRL (CAST SUPPLIES) ×3 IMPLANT
POSITIONER SURGICAL ARM (MISCELLANEOUS) ×3 IMPLANT
SCREW ACECAP 38MM (Screw) ×3 IMPLANT
SCREW PROXIMAL 4.5MMX18MM (Screw) ×3 IMPLANT
STAPLER VISISTAT 35W (STAPLE) ×3 IMPLANT
SUT VIC AB 1 CT1 27 (SUTURE) ×2
SUT VIC AB 1 CT1 27XBRD ANTBC (SUTURE) ×1 IMPLANT
SUT VIC AB 2-0 CT1 27 (SUTURE) ×4
SUT VIC AB 2-0 CT1 TAPERPNT 27 (SUTURE) ×2 IMPLANT
TOWEL OR 17X26 10 PK STRL BLUE (TOWEL DISPOSABLE) ×6 IMPLANT
WATER STERILE IRR 1000ML POUR (IV SOLUTION) ×3 IMPLANT

## 2018-06-26 NOTE — Interval H&P Note (Signed)
History and Physical Interval Note:  06/26/2018 2:48 PM  Charleen Kirks  has presented today for surgery, with the diagnosis of left tibia fracture  The various methods of treatment have been discussed with the patient and family. After consideration of risks, benefits and other options for treatment, the patient has consented to  Procedure(s): left tibia intramedullary nail (Left) as a surgical intervention .  The patient's history has been reviewed, patient examined, no change in status, stable for surgery.  I have reviewed the patient's chart and labs.  Questions were answered to the patient's satisfaction.     Pilar Plate Franciscojavier Wronski

## 2018-06-26 NOTE — Progress Notes (Signed)
Dr Kendall Flack aware of BP in pacu. No orders received.

## 2018-06-26 NOTE — Anesthesia Preprocedure Evaluation (Addendum)
Anesthesia Evaluation  Patient identified by MRN, date of birth, ID band Patient awake    Reviewed: Allergy & Precautions, NPO status , Patient's Chart, lab work & pertinent test results  Airway Mallampati: III  TM Distance: >3 FB Neck ROM: Full    Dental  (+) Edentulous Upper, Poor Dentition, Missing   Pulmonary Current Smoker,    Pulmonary exam normal breath sounds clear to auscultation       Cardiovascular hypertension, Pt. on medications Normal cardiovascular exam Rhythm:Regular Rate:Normal  ECG: SR, rate 51   Neuro/Psych PSYCHIATRIC DISORDERS Anxiety Bipolar Disorder PTSD (post-traumatic stress disorder)negative neurological ROS     GI/Hepatic GERD  Medicated and Controlled,(+)     substance abuse  ,   Endo/Other  Hypothyroidism   Renal/GU negative Renal ROS     Musculoskeletal negative musculoskeletal ROS (+)   Abdominal   Peds  Hematology  (+) anemia ,   Anesthesia Other Findings Left tibia fracture  Reproductive/Obstetrics                            Anesthesia Physical Anesthesia Plan  ASA: III  Anesthesia Plan: General   Post-op Pain Management:    Induction: Intravenous  PONV Risk Score and Plan: 1 and Treatment may vary due to age or medical condition, Ondansetron, Dexamethasone and Midazolam  Airway Management Planned: LMA  Additional Equipment:   Intra-op Plan:   Post-operative Plan: Extubation in OR  Informed Consent: I have reviewed the patients History and Physical, chart, labs and discussed the procedure including the risks, benefits and alternatives for the proposed anesthesia with the patient or authorized representative who has indicated his/her understanding and acceptance.   Dental advisory given  Plan Discussed with: CRNA  Anesthesia Plan Comments:         Anesthesia Quick Evaluation

## 2018-06-26 NOTE — Transfer of Care (Signed)
Immediate Anesthesia Transfer of Care Note  Patient: Rodney Romero  Procedure(s) Performed: left tibia intramedullary nail (Left Leg Lower)  Patient Location: PACU  Anesthesia Type:General  Level of Consciousness: awake, alert  and oriented  Airway & Oxygen Therapy: Patient Spontanous Breathing and Patient connected to face mask oxygen  Post-op Assessment: Report given to RN and Post -op Vital signs reviewed and stable  Post vital signs: Reviewed and stable  Last Vitals:  Vitals Value Taken Time  BP    Temp    Pulse 74 06/26/2018  5:53 PM  Resp 15 06/26/2018  5:53 PM  SpO2 100 % 06/26/2018  5:53 PM  Vitals shown include unvalidated device data.  Last Pain:  Vitals:   06/26/18 1423  TempSrc: Oral  PainSc:          Complications: No apparent anesthesia complications

## 2018-06-26 NOTE — Anesthesia Procedure Notes (Signed)
Procedure Name: Intubation Date/Time: 06/26/2018 4:26 PM Performed by: Berenize Gatlin D, CRNA Pre-anesthesia Checklist: Patient identified, Emergency Drugs available, Suction available and Patient being monitored Patient Re-evaluated:Patient Re-evaluated prior to induction Oxygen Delivery Method: Circle system utilized Preoxygenation: Pre-oxygenation with 100% oxygen Induction Type: IV induction Ventilation: Mask ventilation without difficulty Laryngoscope Size: Mac and 4 Grade View: Grade I Tube type: Oral Tube size: 7.5 mm Number of attempts: 1 Airway Equipment and Method: Stylet Placement Confirmation: ETT inserted through vocal cords under direct vision,  positive ETCO2 and breath sounds checked- equal and bilateral Secured at: 22 cm Tube secured with: Tape Dental Injury: Teeth and Oropharynx as per pre-operative assessment

## 2018-06-26 NOTE — Brief Op Note (Signed)
06/24/2018 - 06/26/2018  5:39 PM  PATIENT:  Rodney Romero  72 y.o. male  PRE-OPERATIVE DIAGNOSIS:  left tibia fracture  POST-OPERATIVE DIAGNOSIS:  left tibia fracture  PROCEDURE:  Procedure(s): left tibia intramedullary nail (Left)  SURGEON:  Surgeon(s) and Role:    Gaynelle Arabian, MD - Primary  PHYSICIAN ASSISTANT:   ASSISTANTS: Kristi Edmisten, PA-C   ANESTHESIA:   general  EBL:  25 ml   BLOOD ADMINISTERED:none  DRAINS: none   LOCAL MEDICATIONS USED:  NONE  COUNTS:  YES  TOURNIQUET:   Total Tourniquet Time Documented: Thigh (Left) - 34 minutes Total: Thigh (Left) - 34 minutes   DICTATION: .Other Dictation: Dictation Number [id number]  PLAN OF CARE: Admit for overnight observation  PATIENT DISPOSITION:  PACU - hemodynamically stable.

## 2018-06-26 NOTE — Op Note (Signed)
NAME: Rodney Romero, Rodney Romero MEDICAL RECORD OF:[id number] ACCOUNT [id number] DATE OF BIRTH:[birth year] FACILITY: [facility] LOCATION: [facility] PHYSICIAN:Romney Compean Zella Ball, MD  OPERATIVE REPORT  DATE OF PROCEDURE:  06/26/2018  PREOPERATIVE DIAGNOSIS:  Left tibia fracture.  POSTOPERATIVE DIAGNOSIS:  Left tibia fracture.  PROCEDURE:  Intramedullary nailing, left tibia fracture.  SURGEON:  Gaynelle Arabian, MD  ASSISTANT:  Theresa Duty, PA-C   ANESTHESIA:  General.  ESTIMATED BLOOD LOSS:  Minimal.  DRAIN:  None.  TOURNIQUET TIME:  33 minutes at 300 mmHg.  COMPLICATIONS:  None.  CONDITION:  Stable to recovery.  BRIEF CLINICAL NOTE:  The patient is a 72 year old male who had a fall 2 nights ago, sustaining an oblique distal tib-fib fracture.  He was splinted at that time and presents now for intramedullary nailing of this tibia fracture.  DESCRIPTION OF FINDINGS:  After successful administration of general anesthetic, a tourniquet was placed high on his left thigh and left lower extremity, prepped and draped in the usual sterile fashion.  Extremity was wrapped in Esmarch and tourniquet  inflated to 300 mmHg.  A midline incision was made starting at the tibial tubercle and coursing proximally to the medial aspect of the patella.  The skin was cut with a 10 blade through the superficial retinaculum.  Starting guide pin for the tibial nail  was then passed so it will be in the center of the proximal tibia on AP and lateral views.  It was found to be in good position, and then a starter reamer was passed over the pin to gain access to the canal.  The beaded guide rod was then passed through the entry point and down the tibial canal across the fracture and into the distal fragment.  The distal tip lies at the physeal scar of the tibia.  The length was 36 mm.  We reamed over the guide pin up to  12.5 mm for placement of an 11 mm tibial nail.  This was the Biomet reverse of the Versa tibial nail.  The  nail is a 12 mm diameter x 36 cm in length.  It was attached to the impaction guide, and it was impacted over the guide rod and into the tibial  canal across the fracture site into the distal fragment.  It was found to be in excellent position on AP and lateral views.  There was excellent reduction of this fracture.  The alignment was excellent.  Just a tiny gap on the AP, but no gap on the  lateral.  I was very pleased with the position.  I removed the guide rod, but the nail was left intact.  Through the external guide, the proximal interlock was placed.  We drilled through the proximal guide and depth gauge used to measure the length,  which is 70 mm.  A 70 mm screw was placed and confirmed to go through the nail on AP and lateral views.  The external guide was then removed.  Utilizing the freehand technique, the distal interlock was filled with a 36 mm screw with excellent bicortical purchase through a very small incision medially.  It was found to be across the nail on AP and lateral  views and of appropriate length.  The wounds were then copiously irrigated with saline solution.  Distally, the interlock incision was closed with staples.  Proximal incision after irrigating thoroughly with saline was closed deep with interrupted #1  Vicryl, subcu with interrupted 2-0 Vicryl, and skin with staples.  The incisions were cleaned and  dried and a bulky sterile dressing applied.  He was then placed into a Cam walker, awakened and transported to recovery in stable condition.  Note that a surgical assistant was a medical necessity for this procedure for fracture reduction and for proper placement of the intramedullary hardware.  LN/NUANCE  D:06/26/2018 T:06/26/2018 JOB:[id number]

## 2018-06-26 NOTE — Anesthesia Postprocedure Evaluation (Signed)
Anesthesia Post Note  Patient: PRATHIK AMAN  Procedure(s) Performed: left tibia intramedullary nail (Left Leg Lower)     Patient location during evaluation: PACU Anesthesia Type: General Level of consciousness: awake and alert Pain management: pain level controlled Vital Signs Assessment: post-procedure vital signs reviewed and stable Respiratory status: spontaneous breathing, nonlabored ventilation, respiratory function stable and patient connected to nasal cannula oxygen Cardiovascular status: blood pressure returned to baseline and stable Postop Assessment: no apparent nausea or vomiting Anesthetic complications: no    Last Vitals:  Vitals:   06/26/18 1845 06/26/18 1900  BP: (!) 187/87 (!) 193/90  Pulse:    Resp: 12   Temp: 36.8 C   SpO2:      Last Pain:  Vitals:   06/26/18 1815  TempSrc:   PainSc: 0-No pain                 Zoie Sarin P Randolf Sansoucie

## 2018-06-27 ENCOUNTER — Encounter (HOSPITAL_COMMUNITY): Payer: Self-pay | Admitting: Orthopedic Surgery

## 2018-06-27 MED ORDER — POLYETHYLENE GLYCOL 3350 17 G PO PACK
17.0000 g | PACK | Freq: Every day | ORAL | Status: DC | PRN
  Administered 2018-06-27: 17 g via ORAL
  Filled 2018-06-27: qty 1

## 2018-06-27 MED ORDER — CHLORHEXIDINE GLUCONATE CLOTH 2 % EX PADS
6.0000 | MEDICATED_PAD | Freq: Every day | CUTANEOUS | Status: DC
  Administered 2018-06-27: 6 via TOPICAL

## 2018-06-27 MED ORDER — MUPIROCIN 2 % EX OINT
1.0000 "application " | TOPICAL_OINTMENT | Freq: Two times a day (BID) | CUTANEOUS | Status: DC
  Administered 2018-06-27 – 2018-06-30 (×7): 1 via NASAL
  Filled 2018-06-27 (×2): qty 22

## 2018-06-27 NOTE — Discharge Instructions (Signed)
Dr. Gaynelle Arabian Total [facility] Specialist [facility] [address] [place], [place] [address] [phone]   Remove items at home which could result in a fall. This includes throw rugs or furniture in walking pathways.   ICE to the affected leg every three hours for 30 minutes at a time and then as needed for pain and swelling.  Continue to use ice on the leg for pain and swelling from surgery. You may notice swelling that will progress down to the foot and ankle.  This is normal after surgery.  Elevate the leg when you are not up walking on it.    Continue to use the breathing machine which will help keep your temperature down.  It is common for your temperature to cycle up and down following surgery, especially at night when you are not up moving around and exerting yourself.  The breathing machine keeps your lungs expanded and your temperature down.  DRESSING / WOUND CARE / SHOWERING You may shower 3 days after surgery, but keep the wounds dry during showering.  You may use an occlusive plastic wrap (Press'n Seal for example), NO SOAKING/SUBMERGING IN THE BATHTUB.  If the bandage gets wet, change with a clean dry gauze.  If the incision gets wet, pat the wound dry with a clean towel. You may start showering once you are discharged home but do not submerge the incision under water. Just pat the incision dry and apply a dry gauze dressing on daily. Change the surgical dressing daily and reapply a dry dressing each time.  ACTIVITY Use walker as long as suggested by your caregivers. Avoid periods of inactivity such as sitting longer than an hour when not asleep. This helps prevent blood clots.  Do not drive a car for 6 weeks or until released by you surgeon.  Do not drive while taking narcotics.  WEIGHT BEARING Partial weight-bearing to the left leg.  MEDICATIONS See your medication summary on the  After Visit Summary that the nursing staff will review with you prior to discharge.  You may have some home medications which will be placed on hold until you complete the course of blood thinner medication.  It is important for you to complete the blood thinner medication as prescribed by your surgeon.  Continue your approved medications as instructed at time of discharge.                                                 FOLLOW-UP APPOINTMENTS Make sure you keep all of your appointments after your operation with your surgeon and caregivers. You should call the office at the above phone number and make an appointment for approximately two weeks after the date of your surgery or on the date instructed by your surgeon outlined in the "After Visit Summary".  MAKE SURE YOU:   Understand these instructions.   Get help right away if you are not doing well or get worse.   Do not submerge incision under water. Please use good hand washing techniques while changing dressing each day. May shower starting three days after surgery. Please use a clean towel to pat the incision dry following showers. Continue to use ice for pain and swelling after surgery. Do not use any lotions or creams on the incision until  instructed by your surgeon.

## 2018-06-27 NOTE — Evaluation (Signed)
Physical Therapy Evaluation Patient Details Name: Rodney Romero MRN: [id number] DOB: 1946-07-30 Today's Date: 06/27/2018   History of Present Illness  72 yo male admitted with distal tibia fx and proximal fibular fx. S/P IM nail L tibia 7/24. Hx of bipolar d/o, PTSD  Clinical Impression  On eval, pt required Min assist for mobility. He walked ~45 feet with a RW. Pain rated 7/10 with activity. Will follow and progress activity as tolerated. Discussed d/c plan-pt plans to return home. He stated his wife can assist as needed.     Follow Up Recommendations Home health PT;Supervision/Assistance - 24 hour    Equipment Recommendations  3in1     Recommendations for Other Services       Precautions / Restrictions Precautions Precautions: Fall Required Braces or Orthoses: Other Brace/Splint Other Brace/Splint: CAM boot L Restrictions Weight Bearing Restrictions: Yes LLE Weight Bearing: Partial weight bearing LLE Partial Weight Bearing Percentage or Pounds: 25-50%      Mobility  Bed Mobility               General bed mobility comments: sitting EOB with NT  Transfers Overall transfer level: Needs assistance Equipment used: Rolling walker (2 wheeled) Transfers: Sit to/from Stand Sit to Stand: Min assist;From elevated surface         General transfer comment: Assist to rise, stabilize, control descent. VCs safety, technique, hand/LE placement  Ambulation/Gait Ambulation/Gait assistance: Min assist Gait Distance (Feet): 45 Feet Assistive device: Rolling walker (2 wheeled) Gait Pattern/deviations: Step-to pattern     General Gait Details: VCs safety, technique, sequence, adherence to PWB status. Slow gait speed. Followed with recliner.   Stairs            Wheelchair Mobility    Modified Rankin (Stroke Patients Only)       Balance Overall balance assessment: Needs assistance;History of Falls         Standing balance support: Bilateral upper extremity  supported Standing balance-Leahy Scale: Poor                               Pertinent Vitals/Pain Pain Assessment: 0-10 Pain Score: 7  Pain Location: L lower leg Pain Descriptors / Indicators: Sharp;Sore;Grimacing;Aching Pain Intervention(s): Limited activity within patient's tolerance;Monitored during session;Repositioned;Ice applied    Home Living Family/patient expects to be discharged to:: Private residence Living Arrangements: Spouse/significant other   Type of Home: House Home Access: Stairs to enter   [employer] of Steps: 3-back; front-4 (front has 2 rails) Home Layout: One level Home Equipment: Cane - single point;Walker - 2 wheels      Prior Function Level of Independence: Independent               Hand Dominance        Extremity/Trunk Assessment   Upper Extremity Assessment Upper Extremity Assessment: Overall WFL for tasks assessed    Lower Extremity Assessment Lower Extremity Assessment: LLE deficits/detail LLE Deficits / Details: CAM boot on lower leg    Cervical / Trunk Assessment Cervical / Trunk Assessment: Normal  Communication   Communication: No difficulties  Cognition Arousal/Alertness: Awake/alert Behavior During Therapy: WFL for tasks assessed/performed Overall Cognitive Status: Within Functional Limits for tasks assessed                                        General Comments  Exercises     Assessment/Plan    PT Assessment Patient needs continued PT services  PT Problem List Decreased strength;Decreased mobility;Decreased range of motion;Decreased activity tolerance;Decreased balance;Decreased knowledge of use of DME;Decreased knowledge of precautions;Pain       PT Treatment Interventions DME instruction;Gait training;Functional mobility training;Therapeutic activities;Balance training;Patient/family education;Therapeutic exercise;Stair training    PT Goals (Current goals can be  found in the Care Plan section)  Acute Rehab PT Goals Patient Stated Goal: less pain PT Goal Formulation: With patient Time For Goal Achievement: 07/11/18 Potential to Achieve Goals: Good    Frequency Min 5X/week   Barriers to discharge        Co-evaluation               AM-PAC PT "6 Clicks" Daily Activity  Outcome Measure Difficulty turning over in bed (including adjusting bedclothes, sheets and blankets)?: A Lot Difficulty moving from lying on back to sitting on the side of the bed? : Unable Difficulty sitting down on and standing up from a chair with arms (e.g., wheelchair, bedside commode, etc,.)?: Unable Help needed moving to and from a bed to chair (including a wheelchair)?: A Little Help needed walking in hospital room?: A Little Help needed climbing 3-5 steps with a railing? : A Lot 6 Click Score: 12    End of Session Equipment Utilized During Treatment: Gait belt;Other (comment)(CAM boot) Activity Tolerance: Patient tolerated treatment well Patient left: in chair;with call bell/phone within reach   PT Visit Diagnosis: Pain;Difficulty in walking, not elsewhere classified (R26.2);Other abnormalities of gait and mobility (R26.89);History of falling (Z91.81) Pain - Right/Left: Left Pain - part of body: Leg    Time: 3559-7416 PT Time Calculation (min) (ACUTE ONLY): 22 min   Charges:   PT Evaluation $PT Eval Moderate Complexity: 1 Mod          Weston Anna, MPT Pager: [phone]

## 2018-06-27 NOTE — Progress Notes (Signed)
  06/27/2018 1 Day Post-Op Procedure(s) (LRB): left tibia intramedullary nail (Left) Patient reports pain as mild.   Patient seen in rounds by Dr. Wynelle Link. Patient is well, and has had no acute complaints or problems other than pain in the left leg. Denies chest pain or SOB.  They will be PWB to the left (25-50%). Plan is to go Home after hospital stay.  Vital signs in last 24 hours: Temp:  [98.3 F (36.8 C)-99.8 F (37.7 C)] 99.4 F (37.4 C) (07/25 0535) Pulse Rate:  [52-69] 64 (07/25 0535) Resp:  [12-20] 15 (07/25 0535) BP: (141-193)/(62-95) 162/85 (07/25 0535) SpO2:  [96 %-100 %] 100 % (07/25 0535)  I&O's: I/O last 3 completed shifts: In: 2683.8 [P.O.:660; I.V.:1823.8; IV Piggyback:200] Out: 1900 [Urine:1900] No intake/output data recorded.  Labs: Recent Labs    06/25/18 0101  HGB 11.7*   Recent Labs    06/25/18 0101  WBC 8.6  RBC 4.86  HCT 34.6*  PLT 134*   Recent Labs    06/25/18 0101  NA 141  K 3.4*  CL 107  CO2 27  BUN 16  CREATININE 1.16  GLUCOSE 93  CALCIUM 8.8*   Recent Labs    06/25/18 0101  INR 1.14    Exam: General - Patient is Alert and Oriented Extremity - Neurologically intact Neurovascular intact Sensation intact distally Dorsiflexion/Plantar flexion intact Dressing - dressing C/D/I Motor Function - intact, moving foot and toes well on exam.   Past Medical History:  Diagnosis Date  . Bipolar 1 disorder ([place])   . Hypertension   . Hypothyroidism   . PTSD (post-traumatic stress disorder)     Assessment/Plan: 1 Day Post-Op Procedure(s) (LRB): left tibia intramedullary nail (Left) Active Problems:   Closed tibia fracture  Estimated body mass index is 19.56 kg/m as calculated from the following:   Height as of this encounter: 5\' 7"  (1.702 m).   Weight as of this encounter: 56.7 kg (124 lb 14.4 oz). Advance diet Up with therapy  DVT Prophylaxis - Lovenox Partial weight-bearing 25-50% to the left side.  D/C O2 and pulse  ox, try on room air.  Possible discharge tomorrow to home if stable and meeting goals with therapy.  Theresa Duty, PA-C Orthopedic Surgery 06/27/2018, 7:43 AM

## 2018-06-27 NOTE — Care Management Note (Signed)
Case Management Note  Patient Details  Name: PIERSON VANTOL MRN: [id number] Date of Birth: 1946-08-16  Subjective/Objective: Patient already followed by [facility]-rep Ronalee Belts following from ortho office. Patient states he plans to d/c home, has rw,& 3n1.PT-recc HHPT. Await final [place] orders.                   Action/Plan:d/c home w/HHC   Expected Discharge Date:  06/29/18               Expected Discharge Plan:  [place]  In-House Referral:     Discharge planning Services  CM Consult  Post Acute Care Choice:  Durable Medical Equipment(has rw,3n1) Choice offered to:     DME Arranged:    DME Agency:     HH Arranged:  PT HH Agency:  [facility] (formerly [employer])  Status of Service:  In process, will continue to follow  If discussed at Long Length of Stay Meetings, dates discussed:    Additional Comments:  Dessa Phi, RN 06/27/2018, 3:05 PM

## 2018-06-28 LAB — CBC
HCT: 35 % — ABNORMAL LOW (ref 39.0–52.0)
Hemoglobin: 11.6 g/dL — ABNORMAL LOW (ref 13.0–17.0)
MCH: 23.5 pg — AB (ref 26.0–34.0)
MCHC: 33.1 g/dL (ref 30.0–36.0)
MCV: 70.9 fL — ABNORMAL LOW (ref 78.0–100.0)
Platelets: 152 10*3/uL (ref 150–400)
RBC: 4.94 MIL/uL (ref 4.22–5.81)
RDW: 14.8 % (ref 11.5–15.5)
WBC: 13.8 10*3/uL — AB (ref 4.0–10.5)

## 2018-06-28 MED ORDER — SODIUM CHLORIDE 0.9 % IV BOLUS
500.0000 mL | Freq: Once | INTRAVENOUS | Status: AC
  Administered 2018-06-28: 500 mL via INTRAVENOUS

## 2018-06-28 MED ORDER — OXYCODONE HCL 5 MG PO TABS: 5.0000 mg | ORAL_TABLET | Freq: Four times a day (QID) | ORAL | 0 refills | Status: DC | PRN

## 2018-06-28 MED ORDER — ENOXAPARIN (LOVENOX) PATIENT EDUCATION KIT
PACK | Freq: Once | Status: AC
  Administered 2018-06-28: 17:00:00
  Filled 2018-06-28: qty 1

## 2018-06-28 MED ORDER — ENOXAPARIN SODIUM 40 MG/0.4ML ~~LOC~~ SOLN: 40.0000 mg | SUBCUTANEOUS | 0 refills | Status: DC

## 2018-06-28 MED ORDER — ENSURE ENLIVE PO LIQD
237.0000 mL | Freq: Three times a day (TID) | ORAL | Status: DC
  Administered 2018-06-28 – 2018-06-30 (×4): 237 mL via ORAL

## 2018-06-28 MED ORDER — ACETAMINOPHEN 325 MG PO TABS
650.0000 mg | ORAL_TABLET | Freq: Four times a day (QID) | ORAL | Status: DC | PRN
  Administered 2018-06-28: 650 mg via ORAL
  Filled 2018-06-28: qty 2

## 2018-06-28 MED ORDER — METHOCARBAMOL 500 MG PO TABS: 500.0000 mg | ORAL_TABLET | Freq: Four times a day (QID) | ORAL | 0 refills | Status: DC | PRN

## 2018-06-28 NOTE — Progress Notes (Signed)
Patient up with therapy in hallway complains of dizziness assisted to chair b/p 98/59 K Edmisten PA paged with orders received D Mateo Flow RN

## 2018-06-28 NOTE — Progress Notes (Addendum)
Physical Therapy Treatment Patient Details Name: Rodney Romero MRN: [id number] DOB: 1946-01-19 Today's Date: 06/28/2018    History of Present Illness 72 yo male admitted with distal tibia fx and proximal fibular fx. S/P IM nail L tibia 7/24. Hx of bipolar d/o, PTSD    PT Comments    2nd session to continue to work on meeting therapy goals. Pt was only able to walk ~20 feet before he c/o dizziness and doubled over on the walker. BP assessed-98/59. Assisted pt back to room in recliner. Pt c/o bil elbow and wrist discomfort due to relying on UEs 2* PWB status. He may need a wheelchair. Pt has been unable to safely meet therapy goals on today-made RN aware. Will continue to follow. He will need to successfully and safely ambulate/negotiate stairs before discharging.    Follow Up Recommendations  Home health PT;Supervision/Assistance - 24 hour     Equipment Recommendations  Rolling walker with 5" wheels;3in1  (may need wheelchair (if possible) if mobility remains limited)    Recommendations for Other Services       Precautions / Restrictions Precautions Precautions: Fall Required Braces or Orthoses: Other Brace/Splint Other Brace/Splint: CAM boot L Restrictions Weight Bearing Restrictions: Yes LLE Weight Bearing: Partial weight bearing LLE Partial Weight Bearing Percentage or Pounds: 25-50%    Mobility  Bed Mobility               General bed mobility comments: oob in recliner  Transfers Overall transfer level: Needs assistance Equipment used: Rolling walker (2 wheeled) Transfers: Sit to/from Stand Sit to Stand: Min assist         General transfer comment: Increased time. VCs safety, technique, hand/LE placement. Assist to rise, stabilize, control descent.   Ambulation/Gait Ambulation/Gait assistance: Min assist; +2 safety/equipment Gait Distance (Feet): 20 Feet Assistive device: Rolling walker (2 wheeled) Gait Pattern/deviations: Step-to pattern;Trunk  flexed     General Gait Details: Mod VCs safety, technique, sequence, adherence to PWB status, posture, step length. Slow gait speed. After turning to go back to room, pt c/o dizziness. BP 98/59. Unable to continue ambulating-recliner used to transport back to room.    Stairs             Wheelchair Mobility    Modified Rankin (Stroke Patients Only)       Balance Overall balance assessment: History of Falls;Needs assistance         Standing balance support: Bilateral upper extremity supported Standing balance-Leahy Scale: Poor                              Cognition Arousal/Alertness: Awake/alert Behavior During Therapy: WFL for tasks assessed/performed Overall Cognitive Status: Within Functional Limits for tasks assessed                                 General Comments: pt doesn't not talk much. Have to pull info out of him (i.e dizzy, pain, etc)      Exercises      General Comments        Pertinent Vitals/Pain Pain Assessment: 0-10 Pain Score: 7  Pain Location: L lower leg Pain Descriptors / Indicators: Sharp;Sore;Grimacing;Aching Pain Intervention(s): Limited activity within patient's tolerance;Repositioned    Home Living                      Prior Function  PT Goals (current goals can now be found in the care plan section) Progress towards PT goals: Not progressing toward goals - comment(this pm-limited by low BP, pt dizzy)    Frequency    Min 5X/week      PT Plan Current plan remains appropriate    Co-evaluation              AM-PAC PT "6 Clicks" Daily Activity  Outcome Measure  Difficulty turning over in bed (including adjusting bedclothes, sheets and blankets)?: A Lot Difficulty moving from lying on back to sitting on the side of the bed? : Unable Difficulty sitting down on and standing up from a chair with arms (e.g., wheelchair, bedside commode, etc,.)?: Unable Help needed moving  to and from a bed to chair (including a wheelchair)?: A Little Help needed walking in hospital room?: A Little Help needed climbing 3-5 steps with a railing? : A Lot 6 Click Score: 12    End of Session Equipment Utilized During Treatment: Gait belt;Other (comment)(CAM boot) Activity Tolerance: Patient limited by fatigue;Patient limited by pain(limited by dizziness) Patient left: in chair;with call bell/phone within reach   PT Visit Diagnosis: Pain;Difficulty in walking, not elsewhere classified (R26.2);Other abnormalities of gait and mobility (R26.89);History of falling (Z91.81) Pain - Right/Left: Left Pain - part of body: Leg     Time: 1021-1173 PT Time Calculation (min) (ACUTE ONLY): 24 min  Charges:  $Gait Training: 8-22 mins $Therapeutic Activity: 8-22 mins                        Weston Anna, MPT Pager: [phone]

## 2018-06-28 NOTE — Progress Notes (Signed)
Physical Therapy Treatment Patient Details Name: Rodney Romero MRN: [id number] DOB: 1946-11-29 Today's Date: 06/28/2018    History of Present Illness 72 yo male admitted with distal tibia fx and proximal fibular fx. S/P IM nail L tibia 7/24. Hx of bipolar d/o, PTSD    PT Comments    Progressing with mobility. Pt is able to adhere to precautions-he is aware of them but cannot state specifically. Pain rated 7/10 with activity. Practiced stair negotiation with 1 crutch, 1 rail on today. Will plan to see a 2nd time prior to d/c. Pt stated he plans to have his daughter assist him up the stairs into the house.     Follow Up Recommendations  Home health PT;Supervision/Assistance - 24 hour     Equipment Recommendations  Rolling walker with 5" wheels;3in1 (PT)    Recommendations for Other Services       Precautions / Restrictions Precautions Precautions: Fall Required Braces or Orthoses: Other Brace/Splint Other Brace/Splint: CAM boot L Restrictions Weight Bearing Restrictions: Yes LLE Weight Bearing: Partial weight bearing LLE Partial Weight Bearing Percentage or Pounds: 25-50    Mobility  Bed Mobility Overal bed mobility: Needs Assistance Bed Mobility: Supine to Sit     Supine to sit: Min assist;HOB elevated     General bed mobility comments: assist for L LE. Pt preferred to hook L LE with his R foot. He started off well but then he was unable to keep it hooked.   Transfers Overall transfer level: Needs assistance Equipment used: Rolling walker (2 wheeled) Transfers: Sit to/from Stand Sit to Stand: Min assist;From elevated surface         General transfer comment: Assist to rise, stabilize, control descent. VCs safety, technique, hand/LE placement  Ambulation/Gait Ambulation/Gait assistance: Min assist Gait Distance (Feet): 40 Feet Assistive device: Rolling walker (2 wheeled) Gait Pattern/deviations: Step-to pattern     General Gait Details: VCs safety,  technique, sequence, adherence to PWB status. Slow gait speed.    Stairs Stairs: Yes Stairs assistance: Min assist Stair Management: Forwards;With crutches;One rail Left;Step to pattern Number of Stairs: 2 General stair comments: up and over portable steps x 1. VCs safety, technique, sequence. Assist to stabilize pt throughout task. Increased time.    Wheelchair Mobility    Modified Rankin (Stroke Patients Only)       Balance Overall balance assessment: Needs assistance;History of Falls         Standing balance support: Bilateral upper extremity supported Standing balance-Leahy Scale: Poor                              Cognition Arousal/Alertness: Awake/alert Behavior During Therapy: WFL for tasks assessed/performed Overall Cognitive Status: Within Functional Limits for tasks assessed                                        Exercises      General Comments        Pertinent Vitals/Pain Pain Assessment: 0-10 Pain Score: 7  Pain Location: L lower leg Pain Descriptors / Indicators: Sharp;Sore;Grimacing;Aching Pain Intervention(s): Monitored during session;Repositioned;Ice applied    Home Living                      Prior Function            PT Goals (current goals can now  be found in the care plan section) Progress towards PT goals: Progressing toward goals    Frequency    Min 5X/week      PT Plan Current plan remains appropriate    Co-evaluation              AM-PAC PT "6 Clicks" Daily Activity  Outcome Measure  Difficulty turning over in bed (including adjusting bedclothes, sheets and blankets)?: A Lot Difficulty moving from lying on back to sitting on the side of the bed? : Unable Difficulty sitting down on and standing up from a chair with arms (e.g., wheelchair, bedside commode, etc,.)?: Unable Help needed moving to and from a bed to chair (including a wheelchair)?: A Little Help needed walking in  hospital room?: A Little Help needed climbing 3-5 steps with a railing? : A Lot 6 Click Score: 12    End of Session Equipment Utilized During Treatment: Gait belt;Other (comment)(CAM boot) Activity Tolerance: Patient tolerated treatment well Patient left: in chair;with call bell/phone within reach   PT Visit Diagnosis: Pain;Difficulty in walking, not elsewhere classified (R26.2);Other abnormalities of gait and mobility (R26.89);History of falling (Z91.81) Pain - Right/Left: Left Pain - part of body: Leg     Time: 0300-9233 PT Time Calculation (min) (ACUTE ONLY): 32 min  Charges:  $Gait Training: 23-37 mins             Weston Anna, MPT Pager: [phone]

## 2018-06-28 NOTE — Care Management Important Message (Signed)
Important Message  Patient Details  Name: Rodney Romero MRN: [id number] Date of Birth: 11-24-1946   Medicare Important Message Given:  Yes    Kerin Salen 06/28/2018, 10:55 AMImportant Message  Patient Details  Name: Rodney Romero MRN: [id number] Date of Birth: 1946-02-08   Medicare Important Message Given:  Yes    Kerin Salen 06/28/2018, 10:55 AM

## 2018-06-28 NOTE — Care Management Note (Signed)
Case Management Note  Patient Details  Name: Rodney Romero MRN: [id number] Date of Birth: 13-Jan-1946  Subjective/Objective: Received call from PT about d/c plans, states patient still not sure if he has rw,3n1,& per PT he needs it-[facility] rep Santiago Glad aware & will discuss process w/patient, & has  delivered rw,3n1 to rm prior d/c. [facility] rep Ronalee Belts aware.No further CM needs.                  Action/Plan:d/c home w/HHC/dme   Expected Discharge Date:  06/28/18               Expected Discharge Plan:  [place]  In-House Referral:     Discharge planning Services  CM Consult  Post Acute Care Choice:    Choice offered to:  Patient  DME Arranged:  3-N-1, Walker rolling DME Agency:  [place]:  PT [place]:  [facility] ([facility])  Status of Service:  Completed, signed off  If discussed at [employer], dates discussed:    Additional Comments:  Dessa Phi, RN 06/28/2018, 10:43 AM

## 2018-06-28 NOTE — Progress Notes (Signed)
  06/28/2018 2 Days Post-Op Procedure(s) (LRB): left tibia intramedullary nail (Left) Patient reports pain as mild to moderate. .   Patient seen in rounds with Dr. Wynelle Link. Patient is well, and has had no acute complaints or problems other than pain in the left leg. States he is ready to go home today. Voiding without difficulty and positive flatus. Denies chest pain or SOB.  They will be PWB to the left (25-50%). Plan is to go Home after hospital stay.  Vital signs in last 24 hours: Temp:  [98.3 F (36.8 C)-100.5 F (38.1 C)] 99.1 F (37.3 C) (07/26 0621) Pulse Rate:  [62-87] 73 (07/26 0621) Resp:  [14-17] 17 (07/26 0621) BP: (122-166)/(67-85) 122/67 (07/26 0621) SpO2:  [96 %-100 %] 96 % (07/26 0621)  I&O's: I/O last 3 completed shifts: In: 2465 [P.O.:900; I.V.:1265; IV Piggyback:300] Out: 2625 [Urine:2625] No intake/output data recorded.  Exam: General - Patient is Alert and Oriented Extremity - Neurologically intact Neurovascular intact Sensation intact distally Dorsiflexion/Plantar flexion intact Dressing - dressing C/D/I Motor Function - intact, moving foot and toes well on exam.   Past Medical History:  Diagnosis Date  . Bipolar 1 disorder ([place])   . Hypertension   . Hypothyroidism   . PTSD (post-traumatic stress disorder)     Assessment/Plan: 2 Days Post-Op Procedure(s) (LRB): left tibia intramedullary nail (Left) Active Problems:   Closed tibia fracture  Estimated body mass index is 19.56 kg/m as calculated from the following:   Height as of this encounter: 5\' 7"  (1.702 m).   Weight as of this encounter: 56.7 kg (124 lb 14.4 oz). Up with therapy D/C IV fluids  DVT Prophylaxis - Lovenox Dressing changed today PWB to the left 25-50%  Plan for D/C to home today after therapy session with HHPT. Will require CAM boot for approximately 6 weeks. Follow-up in the office with Dr. Wynelle Link in 2 weeks.   Theresa Duty, PA-C Orthopedic Surgery 06/28/2018,  7:25 AM

## 2018-06-28 NOTE — Progress Notes (Signed)
Patient unable to tolerate standing for orthostatic vital signs. assited to bed. [place] notified orders received D Mateo Flow RN

## 2018-06-29 NOTE — Progress Notes (Signed)
Physical Therapy Treatment Patient Details Name: Rodney Romero MRN: [id number] DOB: [birth year] Today's Date: 06/29/2018    History of Present Illness 72 yo male admitted with distal tibia fx and proximal fibular fx. S/P IM nail L tibia 7/24. Hx of bipolar d/o, PTSD    PT Comments    Pt still very slow with progression due to pain, still needing assist for any movement of LLE , CAM boot heavy for patient, challenging with PWB during transfers and gait, SLOW ambulation, limited distance,  And drop of BPS to 82/48 during short ambulation. Pt has history of falls, and with this challenge of BPS and limited ability of mobility with PWB feel he would benefit from ST-SNF for continued rehab and safety. Pt and family (Brother and sister in Sports [employer] ) agree. Spoke with SW today to alert of this . Thanks   Follow Up Recommendations  SNF     Equipment Recommendations  Rolling walker with 5" wheels;3in1 (PT)(may need to consider wheelchair (if possible) if mobility remains limited)    Recommendations for Other Services       Precautions / Restrictions Precautions Precautions: Fall Required Braces or Orthoses: Other Brace/Splint Other Brace/Splint: CAM boot L Restrictions Weight Bearing Restrictions: Yes LLE Weight Bearing: Partial weight bearing LLE Partial Weight Bearing Percentage or Pounds: 25-50%    Mobility  Bed Mobility Overal bed mobility: Needs Assistance Bed Mobility: Supine to Sit     Supine to sit: Min assist;HOB elevated     General bed mobility comments: assist for L LE. Pt preferred to hook L LE with his R foot. He started off well but then he was unable to keep it hooked. Had to assit him with LLe movment due to CAM boot very heavy for him   Transfers Overall transfer level: Needs assistance Equipment used: Rolling walker (2 wheeled) Transfers: Sit to/from Stand Sit to Stand: Min assist;From elevated surface         General transfer comment: Assist to rise,  stabilize, control descent. VCs safety, technique, hand/LE placement  Ambulation/Gait Ambulation/Gait assistance: Min assist Gait Distance (Feet): 30 Feet Assistive device: Rolling walker (2 wheeled) Gait Pattern/deviations: Step-to pattern     General Gait Details: VCs safety, technique, sequence, adherence to PWB status. Slow gait speed. followed by cahir due to BP dropping and needed to sit and rest for safety .    Stairs             Wheelchair Mobility    Modified Rankin (Stroke Patients Only)       Balance Overall balance assessment: History of Falls;Needs assistance         Standing balance support: Bilateral upper extremity supported Standing balance-Leahy Scale: Poor                              Cognition Arousal/Alertness: Awake/alert Behavior During Therapy: WFL for tasks assessed/performed Overall Cognitive Status: Within Functional Limits for tasks assessed                                 General Comments: Pt quiet but very cooperative and answers you , you just have to ask. He has only a little symptoms with drop in BP, until it gets really low.       Exercises Other Exercises Other Exercises: educated and had pt demonstrate toe wiggling, and small ankle pumps  General Comments        Pertinent Vitals/Pain Pain Score: 6  Pain Location: L Lower leg. Increased a lot with little movement Pain Descriptors / Indicators: Sharp;Sore;Grimacing;Aching Pain Intervention(s): Monitored during session;Repositioned    Home Living                      Prior Function            PT Goals (current goals can now be found in the care plan section) Acute Rehab PT Goals Patient Stated Goal: less pain PT Goal Formulation: With patient Time For Goal Achievement: 07/11/18 Potential to Achieve Goals: Good Progress towards PT goals: Not progressing toward goals - comment(makign slow progress, limited with low BP, and  unsafe with balance )    Frequency    Min 5X/week      PT Plan Discharge plan needs to be updated(since mobility not progressing as expected and LOW BPS , recommend ST-SNF for saety and rehab )    Co-evaluation              AM-PAC PT "6 Clicks" Daily Activity  Outcome Measure  Difficulty turning over in bed (including adjusting bedclothes, sheets and blankets)?: Unable Difficulty moving from lying on back to sitting on the side of the bed? : Unable Difficulty sitting down on and standing up from a chair with arms (e.g., wheelchair, bedside commode, etc,.)?: Unable Help needed moving to and from a bed to chair (including a wheelchair)?: A Little Help needed walking in hospital room?: A Little Help needed climbing 3-5 steps with a railing? : A Lot 6 Click Score: 11    End of Session Equipment Utilized During Treatment: Gait belt;Other (comment)(CAM boot) Activity Tolerance: Patient limited by fatigue;Patient limited by pain(limited by dizziness and drop in BP) Patient left: in chair;with call bell/phone within reach;with family/visitor present Nurse Communication: Mobility status PT Visit Diagnosis: Pain;Difficulty in walking, not elsewhere classified (R26.2);Other abnormalities of gait and mobility (R26.89);History of falling (Z91.81) Pain - Right/Left: Left Pain - part of body: Leg     Time: 1117-1150 PT Time Calculation (min) (ACUTE ONLY): 33 min  Charges:  $Gait Training: 8-22 mins $Therapeutic Activity: 8-22 mins                     Clide Dales, PT Pager: [phone] 06/29/2018    [name], [name] 06/29/2018, 12:01 PM

## 2018-06-29 NOTE — Progress Notes (Signed)
Rodney Romero  MRN: [id number] DOB/Age: 1946-10-28 72 y.o. Physician: Ander Slade, M.D. 3 Days Post-Op Procedure(s) (LRB): left tibia intramedullary nail (Left)  Subjective: Improved pain control today. Vital Signs Temp:  [98.7 F (37.1 C)-101.5 F (38.6 C)] 99.6 F (37.6 C) (07/27 0623) Pulse Rate:  [40-94] 67 (07/27 0623) Resp:  [13-20] 16 (07/27 0623) BP: (98-154)/(59-93) 154/71 (07/27 0623) SpO2:  [85 %-100 %] 100 % (07/27 0623)  Lab Results Recent Labs    06/28/18 1419  WBC 13.8*  HGB 11.6*  HCT 35.0*  PLT 152   BMET No results for input(s): NA, K, CL, CO2, GLUCOSE, BUN, CREATININE, CALCIUM in the last 72 hours. INR  Date Value Ref Range Status  06/25/2018 1.14  Final    Comment:    Performed at [facility], [place] [address] [place], [place] [address]     Exam  Able to SLR, fair calf pump, compartments soft, dressings dry  Plan Ambulate with PT, d/c home today if tolerates mobility Rodney Romero 06/29/2018, 8:46 AM    Contact # [phone]

## 2018-06-29 NOTE — Plan of Care (Signed)
Pt progressing well with no additional needs tonight.

## 2018-06-29 NOTE — NC FL2 (Signed)
[place] LEVEL OF CARE SCREENING TOOL     IDENTIFICATION  Patient Name: Rodney Romero Birthdate: 06-25-1946 Sex: male Admission Date (Current Location): 06/24/2018  [facility] [place] Number:  Herbalist and Address:  [facility],  [place] [address], [place]      Provider Number: [id number]  Attending Physician Name and Address:  Gaynelle Arabian, MD  Relative Name and Phone Number:  Jakwon Gayton [phone]    Current Level of Care: Hospital Recommended Level of Care: [place] Prior Approval Number:    Date Approved/Denied:   PASRR Number:    Discharge Plan: SNF    Current Diagnoses: Patient Active Problem List   Diagnosis Date Noted  . Closed tibia fracture 06/25/2018    Orientation RESPIRATION BLADDER Height & Weight     Self, Time, Situation, Place  Normal Continent Weight: 124 lb 14.4 oz (56.7 kg) Height:  5\' 7"  (170.2 cm)  BEHAVIORAL SYMPTOMS/MOOD NEUROLOGICAL BOWEL NUTRITION STATUS      Continent Diet(Regular)  AMBULATORY STATUS COMMUNICATION OF NEEDS Skin   Limited Assist Verbally Normal                       Personal Care Assistance Level of Assistance  Bathing, Dressing Bathing Assistance: Maximum assistance   Dressing Assistance: Limited assistance     Functional Limitations Info  Sight, Hearing, Speech Sight Info: Adequate Hearing Info: Adequate Speech Info: Adequate    SPECIAL CARE FACTORS FREQUENCY  PT (By licensed PT), OT (By licensed OT)     PT Frequency: 5x/weekly OT Frequency: 5x/weekly            Contractures Contractures Info: Not present    Additional Factors Info  Code Status, Allergies Code Status Info: Full Allergies Info: NKA           Current Medications (06/29/2018):  This is the current hospital active medication list Current Facility-Administered Medications  Medication Dose Route Frequency Provider Last Rate Last Dose  . 0.9 %   sodium chloride infusion   Intravenous Continuous Gaynelle Arabian, MD 75 mL/hr at 06/29/18 0530    . acetaminophen (TYLENOL) tablet 650 mg  650 mg Oral Q6H PRN Corky Sing, PA-C   650 mg at 06/28/18 2207  . amLODipine (NORVASC) tablet 5 mg  5 mg Oral Daily Gaynelle Arabian, MD   5 mg at 06/29/18 0948  . Chlorhexidine Gluconate Cloth 2 % PADS 6 each  6 each Topical Daily Gaynelle Arabian, MD   6 each at 06/27/18 1706  . docusate sodium (COLACE) capsule 100 mg  100 mg Oral BID Gaynelle Arabian, MD   100 mg at 06/29/18 0949  . enoxaparin (LOVENOX) injection 40 mg  40 mg Subcutaneous Q24H Gaynelle Arabian, MD   40 mg at 06/29/18 0738  . feeding supplement (ENSURE ENLIVE) (ENSURE ENLIVE) liquid 237 mL  237 mL Oral TID BM Edmisten, Kristie L, PA   237 mL at 06/28/18 2000  . ferrous sulfate tablet 325 mg  325 mg Oral Q M,W,F Gaynelle Arabian, MD   325 mg at 06/28/18 1103  . galantamine (RAZADYNE ER) 24 hr capsule 8 mg  8 mg Oral Q breakfast Gaynelle Arabian, MD   8 mg at 06/29/18 0738  . hydrALAZINE (APRESOLINE) injection 10 mg  10 mg Intravenous Q6H PRN Gaynelle Arabian, MD   10 mg at 06/28/18 0147  . levothyroxine (SYNTHROID, LEVOTHROID) tablet 137 mcg  137 mcg Oral QAC  breakfast Gaynelle Arabian, MD   137 mcg at 06/29/18 [phone]  . methocarbamol (ROBAXIN) tablet 500 mg  500 mg Oral Q6H PRN Aluisio, Pilar Plate, MD      . metoCLOPramide (REGLAN) tablet 5-10 mg  5-10 mg Oral Q8H PRN Aluisio, Pilar Plate, MD       Or  . metoCLOPramide (REGLAN) injection 5-10 mg  5-10 mg Intravenous Q8H PRN Aluisio, Pilar Plate, MD      . mirtazapine (REMERON) tablet 15 mg  15 mg Oral QHS Gaynelle Arabian, MD   15 mg at 06/28/18 2207  . morphine 2 MG/ML injection 1 mg  1 mg Intravenous Q2H PRN Aluisio, Pilar Plate, MD      . mupirocin ointment (BACTROBAN) 2 % 1 application  1 application Nasal BID Gaynelle Arabian, MD   1 application at 29/92/42 0950  . ondansetron (ZOFRAN) injection 4 mg  4 mg Intravenous Q6H PRN Gaynelle Arabian, MD   4 mg at 06/25/18 2028  .  oxyCODONE (Oxy IR/ROXICODONE) immediate release tablet 5-10 mg  5-10 mg Oral Q3H PRN Gaynelle Arabian, MD   10 mg at 06/28/18 0511  . pantoprazole (PROTONIX) EC tablet 40 mg  40 mg Oral Daily Gaynelle Arabian, MD   40 mg at 06/29/18 0948  . polyethylene glycol (MIRALAX / GLYCOLAX) packet 17 g  17 g Oral Daily PRN Edmisten, Kristie L, PA   17 g at 06/27/18 1000  . thiamine (VITAMIN B-1) tablet 100 mg  100 mg Oral Daily Aluisio, Pilar Plate, MD   100 mg at 06/29/18 6834   Or  . thiamine (B-1) injection 100 mg  100 mg Intravenous Daily Aluisio, Pilar Plate, MD      . traZODone (DESYREL) tablet 50 mg  50 mg Oral QHS PRN Aluisio, Pilar Plate, MD      . vitamin B-12 (CYANOCOBALAMIN) tablet 1,000 mcg  1,000 mcg Oral Daily Gaynelle Arabian, MD   1,000 mcg at 06/29/18 1962     Discharge Medications: Please see discharge summary for a list of discharge medications.  Relevant Imaging Results:  Relevant Lab Results:   Additional Information SSN: [id number]  Pricilla Holm, [place]

## 2018-06-29 NOTE — Progress Notes (Signed)
Contacted on call PA for [facility] explaining that patient dizzy when up with PT and postural hypotension; Discharge discontinued.

## 2018-06-29 NOTE — Clinical Social Work Note (Signed)
Clinical Social Work Assessment  Patient Details  Name: Rodney Romero MRN: [id number] Date of Birth: 1946/03/28  Date of referral:  06/29/18               Reason for consult:  Facility Placement                Permission sought to share information with:  Family Supports, Customer service manager Permission granted to share information::  Yes, Verbal Permission Granted  Name::     Rodney Romero  Agency::  SNF  Relationship::  Wife  Contact Information:  [phone]  Housing/Transportation Living arrangements for the past 2 months:  [place] of Information:  Patient, Medical Team Patient Interpreter Needed:  None Criminal Activity/Legal Involvement Pertinent to Current Situation/Hospitalization:  No - Comment as needed Significant Relationships:  Spouse Lives with:  Spouse Do you feel safe going back to the place where you live?  Yes Need for family participation in patient care:  Yes (Comment)  Care giving concerns:  Patient lives at home with wife, Rodney Romero, and will need SNF for rehab following d/c. Physical therapy noted concerns for slow progression with treatment due to pain levels.    Social Worker assessment / plan:  CSW met with patient to discuss physical therapist's recommendation for SNF placement at d/c. Patient stated he lives at home with wife, Rodney Romero, and had previously been able to navigate the home with ease. He does not use a walker/cane or drive. Patient's wife supports him and does the cooking and cleaning in the household. CSW explained next steps in SNF placement process and patient granted permission to discuss options with his wife. CSW will send out referrals and f/u with patient and wife about bed choice.   Employment status:  Part-Time Nurse, adult PT Recommendations:  [place] / Referral to community resources:  [place]  Patient/Family's Response to  care:  Patient understands therapy recommendations and benefit of SNF. Wife will be updated about facility options.   Patient/Family's Understanding of and Emotional Response to Diagnosis, Current Treatment, and Prognosis: Patient is understanding of his need to go to SNF for rehab following d/c. Patient agrees this will be best for him medically.  Emotional Assessment Appearance:  Appears stated age Attitude/Demeanor/Rapport:  Engaged Affect (typically observed):  Appropriate, Pleasant, Calm Orientation:  Oriented to Self, Oriented to Place, Oriented to  Time, Oriented to Situation Alcohol / Substance use:  Not Applicable Psych involvement (Current and /or in the community):  No (Comment)  Discharge Needs  Concerns to be addressed:  Care Coordination Readmission within the last 30 days:  No Current discharge risk:  Physical Impairment Barriers to Discharge:  Continued Medical Work up   [employer], [place] 06/29/2018, 12:51 PM

## 2018-06-30 NOTE — Progress Notes (Signed)
Physical Therapy Treatment Patient Details Name: Rodney Romero MRN: [id number] DOB: 1946-08-21 Today's Date: 06/30/2018    History of Present Illness 72 yo male admitted with distal tibia fx and proximal fibular fx. S/P IM nail L tibia 7/24. Hx of bipolar d/o, PTSD    PT Comments    2nd session to practice stair negotiation in case pt chooses to return home instead of rehab. Pt initially denied need to practice. When prompted to report stair negotiation technique, pt unable to correctly state technique. Encouraged pt to practice again, especially with wife present-pt agreed. Pt/therapist waited several minutes to allow wife to end phone call to participate in education. Therapist explained reasoning for session to wife who acknowledged therapist and stated she would have help to get him into home. She then returned back to her phone call. She appeared disinterested in participating in pt's care. Reported completion of session and concern for whether pt will have the appropriate level of care at home to the RN. All education completed.    Follow Up Recommendations  SNF;Supervision/Assistance - 24 hour(pt stated he prefers to d/c home)     Equipment Recommendations  Rolling walker with 5" wheels;3in1 (PT);Wheelchair (measurements PT)    Recommendations for Other Services       Precautions / Restrictions Precautions Precautions: Fall Required Braces or Orthoses: Other Brace/Splint Other Brace/Splint: CAM boot L Restrictions Weight Bearing Restrictions: Yes LLE Weight Bearing: Partial weight bearing LLE Partial Weight Bearing Percentage or Pounds: 25-50%    Mobility  Bed Mobility               General bed mobility comments: oob in recliner  Transfers Overall transfer level: Needs assistance Equipment used: Rolling walker (2 wheeled) Transfers: Sit to/from Stand Sit to Stand: Min assist         General transfer comment: Increased time. Assist to rise, stabilize,  control descent, position L LE. VCs safety, technique, hand/LE placement  Ambulation/Gait Ambulation/Gait assistance: Min assist Gait Distance (Feet): 30 Feet Assistive device: Rolling walker (2 wheeled) Gait Pattern/deviations: Step-to pattern     General Gait Details: VCs safety, technique, sequence, adherence to PWB status. Slow gait speed. Intermittent assist to steady.    Stairs Stairs: Yes Stairs assistance: Min assist Stair Management: Forwards;Step to pattern;One rail Left;With crutches Number of Stairs: 2 General stair comments: up and over portable steps x 1. VCs safety, technique, sequence, adherence to PWB. Assist to stabilize pt throughout task. Increased time.    Wheelchair Mobility    Modified Rankin (Stroke Patients Only)       Balance                                            Cognition Arousal/Alertness: Awake/alert Behavior During Therapy: WFL for tasks assessed/performed Overall Cognitive Status: Within Functional Limits for tasks assessed                                 General Comments: Pt quiet but very cooperative and answers you      Exercises      General Comments        Pertinent Vitals/Pain Pain Assessment: 0-10 Pain Score: 7  Pain Location: L lower leg especially with movement Pain Descriptors / Indicators: Sharp;Sore;Grimacing;Aching Pain Intervention(s): Monitored during session;Repositioned    Home Living  Prior Function            PT Goals (current goals can now be found in the care plan section) Progress towards PT goals: Progressing toward goals    Frequency    Min 5X/week      PT Plan Current plan remains appropriate    Co-evaluation              AM-PAC PT "6 Clicks" Daily Activity  Outcome Measure  Difficulty turning over in bed (including adjusting bedclothes, sheets and blankets)?: Unable Difficulty moving from lying on back to  sitting on the side of the bed? : Unable Difficulty sitting down on and standing up from a chair with arms (e.g., wheelchair, bedside commode, etc,.)?: Unable Help needed moving to and from a bed to chair (including a wheelchair)?: A Little Help needed walking in hospital room?: A Little Help needed climbing 3-5 steps with a railing? : A Lot 6 Click Score: 11    End of Session Equipment Utilized During Treatment: Gait belt;Other (comment)(CAM boot) Activity Tolerance: Patient tolerated treatment well Patient left: in chair;with call bell/phone within reach;with family/visitor present   PT Visit Diagnosis: Pain;Difficulty in walking, not elsewhere classified (R26.2);Other abnormalities of gait and mobility (R26.89);History of falling (Z91.81) Pain - Right/Left: Left Pain - part of body: Leg     Time: 3833-3832 PT Time Calculation (min) (ACUTE ONLY): 17 min  Charges:  $Gait Training: 8-22 mins                        Weston Anna, MPT Pager: [phone]

## 2018-06-30 NOTE — Progress Notes (Signed)
Physical Therapy Treatment Patient Details Name: Rodney Romero MRN: [id number] DOB: 05-Aug-1946 Today's Date: 06/30/2018    History of Present Illness 72 yo male admitted with distal tibia fx and proximal fibular fx. S/P IM nail L tibia 7/24. Hx of bipolar d/o, PTSD    PT Comments    Pt reported feeling better today. He continues to have moderate pain with activity-rated 7/10 today. He denied dizziness during this session. He fatigues fairly easily.  Discussed d/c plan and recommendation for SNF-pt stated he prefers to d/c home. He will absolutely require 24 hour supervision/assist at home if he refuses SNF! No family present during session. Will plan to have a 2nd session today to practice stair negotiation.    Follow Up Recommendations  SNF;Supervision/Assistance - 24 hour(pt stated he prefers to d/c home. )     Equipment Recommendations  Rolling walker with 5" wheels;3in1; Wheelchair   Recommendations for Other Services       Precautions / Restrictions Precautions Precautions: Fall Required Braces or Orthoses: Other Brace/Splint Other Brace/Splint: CAM boot L Restrictions Weight Bearing Restrictions: Yes LLE Weight Bearing: Partial weight bearing LLE Partial Weight Bearing Percentage or Pounds: 25-50%    Mobility  Bed Mobility Overal bed mobility: Needs Assistance Bed Mobility: Supine to Sit     Supine to sit: Min assist     General bed mobility comments: assist for L LE. Used pillow to assist leg off bed. Increased time.   Transfers Overall transfer level: Needs assistance Equipment used: Rolling walker (2 wheeled) Transfers: Sit to/from Stand Sit to Stand: Min assist;From elevated surface         General transfer comment: Assist to rise, stabilize, control descent, position L LE. VCs safety, technique, hand/LE placement  Ambulation/Gait Ambulation/Gait assistance: Min assist Gait Distance (Feet): 30 Feet Assistive device: Rolling walker (2  wheeled) Gait Pattern/deviations: Step-to pattern     General Gait Details: VCs safety, technique, sequence, adherence to PWB status. Slow gait speed. followed with chair for safety. Pt deneid dizziness. He c/o fatigue and requested to sit   Stairs             Wheelchair Mobility    Modified Rankin (Stroke Patients Only)       Balance Overall balance assessment: History of Falls         Standing balance support: Bilateral upper extremity supported Standing balance-Leahy Scale: Poor                              Cognition Arousal/Alertness: Awake/alert Behavior During Therapy: WFL for tasks assessed/performed Overall Cognitive Status: Within Functional Limits for tasks assessed                                 General Comments: Pt quiet but very cooperative and answers you      Exercises      General Comments        Pertinent Vitals/Pain Pain Assessment: 0-10 Pain Score: 7  Pain Location: L lower leg especially with movement Pain Descriptors / Indicators: Sharp;Sore;Grimacing;Aching Pain Intervention(s): Limited activity within patient's tolerance;Repositioned;Monitored during session    Home Living                      Prior Function            PT Goals (current goals can now be found in  the care plan section) Progress towards PT goals: Progressing toward goals    Frequency    Min 5X/week      PT Plan Current plan remains appropriate    Co-evaluation              AM-PAC PT "6 Clicks" Daily Activity  Outcome Measure  Difficulty turning over in bed (including adjusting bedclothes, sheets and blankets)?: Unable Difficulty moving from lying on back to sitting on the side of the bed? : Unable Difficulty sitting down on and standing up from a chair with arms (e.g., wheelchair, bedside commode, etc,.)?: Unable Help needed moving to and from a bed to chair (including a wheelchair)?: A Little Help needed  walking in hospital room?: A Little Help needed climbing 3-5 steps with a railing? : A Lot 6 Click Score: 11    End of Session Equipment Utilized During Treatment: Gait belt;Other (comment)(CAM boot) Activity Tolerance: Patient limited by fatigue;Patient limited by pain Patient left: in chair;with call bell/phone within reach   PT Visit Diagnosis: Pain;Difficulty in walking, not elsewhere classified (R26.2);Other abnormalities of gait and mobility (R26.89);History of falling (Z91.81) Pain - Right/Left: Left Pain - part of body: Leg     Time: 6378-5885 PT Time Calculation (min) (ACUTE ONLY): 20 min  Charges:  $Gait Training: 8-22 mins                        Weston Anna, MPT Pager: [phone]

## 2018-06-30 NOTE — Progress Notes (Signed)
Discharge paperwork and prescriptions provided and explained to patient and patient's spouse in detail; Lovenox kit in room and given to patient; Provided education on contents of Lovenox kits; patient and patient's spouse verbalized understanding. Pt escorted to lobby in wheelchair to be discharged to home with spouse.

## 2018-06-30 NOTE — Progress Notes (Signed)
NYHEIM SEUFERT  MRN: [id number] DOB/Age: 02/02/1946 72 y.o. Physician: Ander Slade, M.D. 4 Days Post-Op Procedure(s) (LRB): left tibia intramedullary nail (Left)  Subjective: Had a "tough" day yest4erday, but feels ready to go home today.  Vital Signs Temp:  [98.4 F (36.9 C)-99 F (37.2 C)] 98.4 F (36.9 C) (07/28 0515) Pulse Rate:  [69-85] 69 (07/28 0515) Resp:  [14-16] 14 (07/28 0515) BP: (111-136)/(66-74) 135/74 (07/28 0515) SpO2:  [97 %-99 %] 99 % (07/28 0515)  Lab Results Recent Labs    06/28/18 1419  WBC 13.8*  HGB 11.6*  HCT 35.0*  PLT 152   BMET No results for input(s): NA, K, CL, CO2, GLUCOSE, BUN, CREATININE, CALCIUM in the last 72 hours. INR  Date Value Ref Range Status  06/25/2018 1.14  Final    Comment:    Performed at [facility], [place] [address], [place], [place] [address]     Exam  Able to SLR, fair calf pump, compartments soft, dressings dry, Fx boot applied.  +NVI  Plan Ambulate with PT, - WBAT LLE - plan for dc home today.  Nicholes Stairs 06/30/2018, 9:20 AM    Contact # [phone]

## 2018-07-01 NOTE — Discharge Summary (Signed)
Physician Discharge Summary   Patient ID: Rodney Romero MRN: [id number] DOB/AGE: 07-Jul-1946 72 y.o.  Admit date: 06/24/2018 Discharge date: 06/30/2018  Primary Diagnosis: Left tibia fracture   Admission Diagnoses:  Past Medical History:  Diagnosis Date  . Bipolar 1 disorder ([place])   . Hypertension   . Hypothyroidism   . PTSD (post-traumatic stress disorder)    Discharge Diagnoses:   Active Problems:   Closed tibia fracture  Estimated body mass index is 19.56 kg/m as calculated from the following:   Height as of this encounter: 5' 7"  (1.702 m).   Weight as of this encounter: 56.7 kg (124 lb 14.4 oz).  Procedure:  Procedure(s) (LRB): left tibia intramedullary nail (Left)   Consults: None  HPI: Rodney Romero is a 72 y/o male with PMH significant for hypertension, hypothyroidism, and bipolar 1 disorder who presented to the [facility] Emergency Department on 06/24/2018 with left leg injury and alcohol intoxication. Pt states he fell in his kitchen after drinking approximately half of a 5% smirnoff bottle and taking a sleeping pill a few hours prior. Reports he felt dizzy upon standing and fell directly onto his left leg, which immediately began hurting. He was unable to bear weight after the fall due to pain in the left, so he crawled to the bedroom and called his wife, who brought him to the emergency department. Denies hitting head or LOC. He states he does not have dizzy spells like this often, attributes this to the sleeping pill he took. 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 He is also currently a 1/2 pack a day smoker. States he is having no symptoms other than left lower leg pain. Denies chest pain, SOB, headache, neck or back pain, abdominal pain, tremors or anxiety. Pt was found to have a left distal tibial and left proximal fibular fracture upon x-ray and workup in the  emergency department was significant for hypokalemia (3.4), low hemoglobin (11.7), and low platelets (134K). A posterior splint was applied to the left leg and Dr. Wynelle Link was consulted. Patient was promptly admitted to the orthopedic floor. He presents now for a left tibial intramedullary nail.   Laboratory Data: Admission on 06/24/2018, Discharged on 06/30/2018  Component Date Value Ref Range Status  . Sodium 06/25/2018 141  135 - 145 mmol/L Final  . Potassium 06/25/2018 3.4* 3.5 - 5.1 mmol/L Final  . Chloride 06/25/2018 107  98 - 111 mmol/L Final  . CO2 06/25/2018 27  22 - 32 mmol/L Final  . Glucose, Bld 06/25/2018 93  70 - 99 mg/dL Final  . BUN 06/25/2018 16  8 - 23 mg/dL Final  . Creatinine, Ser 06/25/2018 1.16  0.61 - 1.24 mg/dL Final  . Calcium 06/25/2018 8.8* 8.9 - 10.3 mg/dL Final  . GFR calc non Af Amer 06/25/2018 >60  >60 mL/min Final  . GFR calc Af Amer 06/25/2018 >60  >60 mL/min Final   Comment: (NOTE) The eGFR has been calculated using the CKD EPI equation. This calculation has not been validated in all clinical situations. eGFR's persistently <60 mL/min signify possible Chronic Kidney Disease.   Georgiann Hahn gap 06/25/2018 7  5 - 15 Final   Performed at [facility], [place] [address], [place], [place] [address]  . WBC 06/25/2018 8.6  4.0 - 10.5 K/uL Final  . RBC 06/25/2018 4.86  4.22 - 5.81 MIL/uL Final  .  Hemoglobin 06/25/2018 11.7* 13.0 - 17.0 g/dL Final  . HCT 06/25/2018 34.6* 39.0 - 52.0 % Final  . MCV 06/25/2018 71.2* 78.0 - 100.0 fL Final  . MCH 06/25/2018 24.1* 26.0 - 34.0 pg Final  . MCHC 06/25/2018 33.8  30.0 - 36.0 g/dL Final  . RDW 06/25/2018 15.5  11.5 - 15.5 % Final  . Platelets 06/25/2018 134* 150 - 400 K/uL Final  . Neutrophils Relative % 06/25/2018 64  % Final  . Neutro Abs 06/25/2018 5.5  1.7 - 7.7 K/uL Final  . Lymphocytes Relative 06/25/2018 30  % Final  . Lymphs Abs 06/25/2018 2.5  0.7 - 4.0 K/uL Final  . Monocytes Relative  06/25/2018 5  % Final  . Monocytes Absolute 06/25/2018 0.4  0.1 - 1.0 K/uL Final  . Eosinophils Relative 06/25/2018 1  % Final  . Eosinophils Absolute 06/25/2018 0.1  0.0 - 0.7 K/uL Final  . Basophils Relative 06/25/2018 0  % Final  . Basophils Absolute 06/25/2018 0.0  0.0 - 0.1 K/uL Final   Performed at [facility], [place] [address], [place], [place] [address]  . Prothrombin Time 06/25/2018 14.5  11.4 - 15.2 seconds Final  . INR 06/25/2018 1.14   Final   Performed at [facility], [place] [address], [place], [place] [address]  . ABO/RH(D) 06/25/2018 O POS   Final  . Antibody Screen 06/25/2018 NEG   Final  . Sample Expiration 06/25/2018    Final                   Value:06/28/2018 Performed at [facility], [place] [address], [place], [place] [address]   . Alcohol, Ethyl (B) 06/25/2018 <10  <10 mg/dL Final   Comment: (NOTE) Lowest detectable limit for serum alcohol is 10 mg/dL. For medical purposes only. Performed at [facility], [place] [address], [place], [place] [address]   . ABO/RH(D) 06/25/2018    Final                   Value:O POS Performed at [facility], [place] [address] [place], [place] [address]   . MRSA, PCR 06/26/2018 NEGATIVE  NEGATIVE Final  . Staphylococcus aureus 06/26/2018 POSITIVE* NEGATIVE Final   Comment: (NOTE) The Xpert SA Assay (FDA approved for NASAL specimens in patients 91 years of age and older), is one component of a comprehensive surveillance program. It is not intended to diagnose infection nor to guide or monitor treatment. Performed at [facility], [place] [address] [place], [place] [address]   . WBC 06/28/2018 13.8* 4.0 - 10.5 K/uL Final  . RBC 06/28/2018 4.94  4.22 - 5.81 MIL/uL Final  . Hemoglobin 06/28/2018 11.6* 13.0 - 17.0 g/dL Final  . HCT 06/28/2018 35.0* 39.0 - 52.0 % Final  . MCV 06/28/2018 70.9* 78.0 - 100.0 fL Final  . MCH  06/28/2018 23.5* 26.0 - 34.0 pg Final  . MCHC 06/28/2018 33.1  30.0 - 36.0 g/dL Final  . RDW 06/28/2018 14.8  11.5 - 15.5 % Final  . Platelets 06/28/2018 152  150 - 400 K/uL Final   Performed at [facility], [place] [address], [place], [place] [address]     X-Rays:Dg Tibia/fibula Left  Result Date: 06/26/2018 CLINICAL DATA:  72 year old male with a history of surgical fixation EXAM: LEFT TIBIA AND FIBULA - 2 VIEW; DG C-ARM 1-60 MIN-NO REPORT COMPARISON:  06/25/2018 FINDINGS: Limited intraoperative fluoroscopic spot images of the  left lower extremity. Intraoperative images demonstrate open reduction internal fixation of distal tibial fracture with placement of antegrade intramedullary rod with proximal and distal interlocking screws. Redemonstration of proximal fibular fracture. IMPRESSION: Limited intraoperative images of ORIF of known left tibial fracture. Please refer to the dictated operative report for full details of intraoperative findings and procedure. Electronically Signed   By: Corrie Mckusick D.O.   On: 06/26/2018 18:20   Dg Tibia/fibula Left  Result Date: 06/25/2018 CLINICAL DATA:  Twisting injury from a fall.  Deformity. EXAM: LEFT TIBIA AND FIBULA - 2 VIEW COMPARISON:  None. FINDINGS: Spiral oblique fracture demonstrated in the distal left tibial shaft. Mild anterior angulation of the distal fracture fragment. 8 mm lateral displacement of the distal fracture fragment. Comminuted oblique fractures of the proximal left fibular shaft without significant displacement. Degenerative changes in the knee. Ankle joint appears intact. IMPRESSION: Spiral oblique fracture of the distal left tibial shaft. Comminuted oblique fractures of the proximal left fibular shaft. Electronically Signed   By: Lucienne Capers M.D.   On: 06/25/2018 01:19   Dg Pelvis Portable  Result Date: 06/25/2018 CLINICAL DATA:  Pelvic pain after a fall. EXAM: PORTABLE PELVIS 1-2 VIEWS COMPARISON:  CT abdomen  and pelvis 12/18/2013 FINDINGS: There is no evidence of pelvic fracture or diastasis. No pelvic bone lesions are seen. IMPRESSION: Negative. Electronically Signed   By: Lucienne Capers M.D.   On: 06/25/2018 02:04   Dg Chest Port 1 View  Result Date: 06/25/2018 CLINICAL DATA:  Patient fell.  Pain.  Tib-fib fractures. EXAM: PORTABLE CHEST 1 VIEW COMPARISON:  03/06/2018 FINDINGS: Heart size and pulmonary vascularity are normal. Lungs are clear. No airspace disease or consolidation. No blunting of costophrenic angles. No pneumothorax. Prominent soft tissue in the right paratracheal region probably represents prominent vascular structures. Similar appearance to previous study. Calcification of the aorta. IMPRESSION: No active disease. Electronically Signed   By: Lucienne Capers M.D.   On: 06/25/2018 02:04   Dg C-arm 1-60 Min-no Report  Result Date: 06/26/2018 Fluoroscopy was utilized by the requesting physician.  No radiographic interpretation.    EKG: Orders placed or performed during the hospital encounter of 06/24/18  . ED EKG  . ED EKG  . EKG 12-Lead  . EKG 12-Lead  . EKG     Hospital Course: ZACK CRAGER is a 72 y.o. who was admitted to [facility]. They were brought to the operating room on 06/26/2018 and underwent Procedure(s): left tibia intramedullary nail.  Patient tolerated the procedure well and was later transferred to the recovery room and then to the orthopaedic floor for postoperative care with CAM walker boot in place. They were given PO and IV analgesics for pain control following their surgery.  They were given 24 hours of postoperative antibiotics of  Anti-infectives (From admission, onward)   Start     Dose/Rate Route Frequency Ordered Stop   06/26/18 2230  ceFAZolin (ANCEF) IVPB 2g/100 mL premix     2 g 200 mL/hr over 30 Minutes Intravenous Every 6 hours 06/26/18 1829 06/27/18 0922   06/26/18 1445  ceFAZolin (ANCEF) IVPB 2g/100 mL premix     2 g 200 mL/hr  over 30 Minutes Intravenous  Once 06/26/18 1445 06/26/18 1628     and started on DVT prophylaxis in the form of Lovenox. PT was ordered. Discharge planning consulted to help with postop disposition and equipment needs. Patient had a decent night on the evening of surgery. They started to get up  OOB with therapy on POD #1. Pt continued to work with therapy into POD #2. Dressing was changed on day two and the incision was clean, dry, and intact with no drainage. Pt experienced some dizziness and weakness on POD #2. A stat CBC was ordered which came back with a minimally dropped hemoglobin. A 500 mL NaCl 0.9% bolus was ordered with orthostatic vital signs following. On POD #3, incision was healing well and dressing was changed. Pt experienced dizziness when getting up with physical therapy and postural hypotension. Discharge was discontinued. Pt was then seen on POD #4 and was feeling better. He was ready to go home pending progress with therapy and improvement of dizziness. He worked with physical therapy for two additional sessions and was doing well. He was discharged to home later that day in stable condition. It was preferred that patient be discharged to SNF for 24/7 assistance and supervision with ambulation. However, patient refused this and was d/c'ed home with the understanding that he needs 24/7 assistance.   Diet: Regular diet Activity: PWB 25-50% to the LLE. CAM walker boot on LLE.  Follow-up: in 2 weeks with Dr. Wynelle Link Disposition - Home with HHPT Discharged Condition: stable   Discharge Instructions    Call MD / Call 911   Complete by:  As directed    If you experience chest pain or shortness of breath, CALL 911 and be transported to the hospital emergency room.  If you develope a fever above 101 F, pus (white drainage) or increased drainage or redness at the wound, or calf pain, call your surgeon's office.   Call MD / Call 911   Complete by:  As directed    If you experience chest pain  or shortness of breath, CALL 911 and be transported to the hospital emergency room.  If you develope a fever above 101 F, pus (white drainage) or increased drainage or redness at the wound, or calf pain, call your surgeon's office.   Constipation Prevention   Complete by:  As directed    Drink plenty of fluids.  Prune juice may be helpful.  You may use a stool softener, such as Colace (over the counter) 100 mg twice a day.  Use MiraLax (over the counter) for constipation as needed.   Constipation Prevention   Complete by:  As directed    Drink plenty of fluids.  Prune juice may be helpful.  You may use a stool softener, such as Colace (over the counter) 100 mg twice a day.  Use MiraLax (over the counter) for constipation as needed.   Diet - low sodium heart healthy   Complete by:  As directed    Diet - low sodium heart healthy   Complete by:  As directed    Discharge instructions   Complete by:  As directed    BLOOD CLOT PREVENTION: Inject one syringe of lovenox once a day for 10 days following surgery. Then resume one 81 mg baby aspirin once a day.   AMBULATION: wear CAM boot for 6 weeks following surgery. You will be partial-weight bearing to the left foot.   FOLLOW-UP: call the office to schedule your first post-operative appointment on August 6th.  SURGICAL DRESSINGS: change your dressing with fresh gauze and tape once a day for 10 days following surgery  SHOWERING: you may shower on Saturday (06/29/2018). Remove the dressing prior to showering and let water run over the incisions, do not scrub the area. After getting out of the shower,  pat dry incisions, do not rub.   Driving restrictions   Complete by:  As directed    No driving for 4 weeks   Increase activity slowly as tolerated   Complete by:  As directed    Partial weight bearing   Complete by:  As directed      Allergies as of 06/30/2018   No Known Allergies     Medication List    STOP taking these medications   aspirin  EC 81 MG tablet   naproxen 500 MG tablet Commonly known as:  NAPROSYN     TAKE these medications   amLODipine 5 MG tablet Commonly known as:  NORVASC Take 5 mg by mouth daily.   enoxaparin 40 MG/0.4ML injection Commonly known as:  LOVENOX Inject 0.4 mLs (40 mg total) into the skin daily. Inject one syringe of Lovenox once a day for 10 days following surgery. Then resume one baby aspirin once a day.   ferrous sulfate 325 (65 FE) MG tablet Take 325 mg by mouth as directed. On Monday, Wednesday and Friday   galantamine 8 MG 24 hr capsule Commonly known as:  RAZADYNE ER Take 8 mg by mouth daily with breakfast.   levothyroxine 137 MCG tablet Commonly known as:  SYNTHROID, LEVOTHROID Take 137 mcg by mouth daily before breakfast.   methocarbamol 500 MG tablet Commonly known as:  ROBAXIN Take 1 tablet (500 mg total) by mouth every 6 (six) hours as needed for muscle spasms.   mirtazapine 15 MG tablet Commonly known as:  REMERON Take 15 mg by mouth at bedtime.   omeprazole 20 MG capsule Commonly known as:  PRILOSEC Take 20 mg by mouth daily.   oxyCODONE 5 MG immediate release tablet Commonly known as:  Oxy IR/ROXICODONE Take 1-2 tablets (5-10 mg total) by mouth every 6 (six) hours as needed for moderate pain or severe pain.   traZODone 50 MG tablet Commonly known as:  DESYREL Take 50 mg by mouth at bedtime as needed for sleep.   VITAMIN B-12 PO Take 1 tablet by mouth daily.   Vitamin B-12 5000 MCG Tbdp Take 2 tablets by mouth daily.            Discharge Care Instructions  (From admission, onward)        Start     Ordered   06/28/18 0000  Partial weight bearing     06/28/18 4827     Follow-up Information    Gaynelle Arabian, MD. Schedule an appointment as soon as possible for a visit on 07/09/2018.   Specialty:  Orthopedic Surgery Contact information: [address] [place] [address] [phone]        [facility] Follow up.     Specialty:  Home Health Services Why:  [facility] physical therapy Contact information: [address] [place] [address] [place] Follow up.   Why:  rolling walker, bedside commode Contact information: [place] [address] [phone]           Signed: Theresa Duty, PA-C Orthopedic Surgery 07/01/2018, 9:09 AM

## 2019-12-01 ENCOUNTER — Ambulatory Visit: Payer: Medicare Other

## 2020-02-02 ENCOUNTER — Emergency Department (HOSPITAL_COMMUNITY): Payer: Medicare Other

## 2020-02-02 ENCOUNTER — Other Ambulatory Visit: Payer: Self-pay

## 2020-02-02 ENCOUNTER — Encounter (HOSPITAL_COMMUNITY): Payer: Self-pay

## 2020-02-02 ENCOUNTER — Ambulatory Visit (INDEPENDENT_AMBULATORY_CARE_PROVIDER_SITE_OTHER)
Admission: EM | Admit: 2020-02-02 | Discharge: 2020-02-02 | Disposition: A | Discharge status: Other Acute Inpatient Hospital | Payer: Medicare Other | Source: Home / Self Care

## 2020-02-02 ENCOUNTER — Encounter (HOSPITAL_COMMUNITY): Payer: Self-pay | Admitting: Internal Medicine

## 2020-02-02 ENCOUNTER — Inpatient Hospital Stay (HOSPITAL_COMMUNITY)
Admission: EM | Admit: 2020-02-02 | Discharge: 2020-02-10 | DRG: 445 | Disposition: A | Discharge status: Home / Self Care | Payer: Medicare Other | Attending: Internal Medicine | Admitting: Internal Medicine

## 2020-02-02 DIAGNOSIS — R1084 Generalized abdominal pain: Secondary | ICD-10-CM | POA: Diagnosis not present

## 2020-02-02 DIAGNOSIS — R5383 Other fatigue: Secondary | ICD-10-CM

## 2020-02-02 DIAGNOSIS — I10 Essential (primary) hypertension: Secondary | ICD-10-CM | POA: Diagnosis present

## 2020-02-02 DIAGNOSIS — R112 Nausea with vomiting, unspecified: Secondary | ICD-10-CM

## 2020-02-02 DIAGNOSIS — I499 Cardiac arrhythmia, unspecified: Secondary | ICD-10-CM

## 2020-02-02 DIAGNOSIS — R7989 Other specified abnormal findings of blood chemistry: Secondary | ICD-10-CM | POA: Diagnosis not present

## 2020-02-02 DIAGNOSIS — R9431 Abnormal electrocardiogram [ECG] [EKG]: Secondary | ICD-10-CM

## 2020-02-02 DIAGNOSIS — E876 Hypokalemia: Secondary | ICD-10-CM | POA: Diagnosis not present

## 2020-02-02 DIAGNOSIS — F4310 Post-traumatic stress disorder, unspecified: Secondary | ICD-10-CM | POA: Diagnosis present

## 2020-02-02 DIAGNOSIS — Z20822 Contact with and (suspected) exposure to covid-19: Secondary | ICD-10-CM | POA: Insufficient documentation

## 2020-02-02 DIAGNOSIS — I340 Nonrheumatic mitral (valve) insufficiency: Secondary | ICD-10-CM | POA: Diagnosis not present

## 2020-02-02 DIAGNOSIS — K759 Inflammatory liver disease, unspecified: Secondary | ICD-10-CM | POA: Diagnosis present

## 2020-02-02 DIAGNOSIS — Z79899 Other long term (current) drug therapy: Secondary | ICD-10-CM

## 2020-02-02 DIAGNOSIS — F17210 Nicotine dependence, cigarettes, uncomplicated: Secondary | ICD-10-CM | POA: Insufficient documentation

## 2020-02-02 DIAGNOSIS — R001 Bradycardia, unspecified: Secondary | ICD-10-CM | POA: Diagnosis not present

## 2020-02-02 DIAGNOSIS — Z79890 Hormone replacement therapy (postmenopausal): Secondary | ICD-10-CM | POA: Diagnosis not present

## 2020-02-02 DIAGNOSIS — R531 Weakness: Secondary | ICD-10-CM

## 2020-02-02 DIAGNOSIS — R634 Abnormal weight loss: Secondary | ICD-10-CM | POA: Diagnosis present

## 2020-02-02 DIAGNOSIS — R1011 Right upper quadrant pain: Secondary | ICD-10-CM | POA: Diagnosis not present

## 2020-02-02 DIAGNOSIS — Z79891 Long term (current) use of opiate analgesic: Secondary | ICD-10-CM | POA: Diagnosis not present

## 2020-02-02 DIAGNOSIS — E538 Deficiency of other specified B group vitamins: Secondary | ICD-10-CM | POA: Diagnosis present

## 2020-02-02 DIAGNOSIS — Z7901 Long term (current) use of anticoagulants: Secondary | ICD-10-CM | POA: Insufficient documentation

## 2020-02-02 DIAGNOSIS — Z89022 Acquired absence of left finger(s): Secondary | ICD-10-CM

## 2020-02-02 DIAGNOSIS — Z7982 Long term (current) use of aspirin: Secondary | ICD-10-CM | POA: Diagnosis not present

## 2020-02-02 DIAGNOSIS — K810 Acute cholecystitis: Principal | ICD-10-CM

## 2020-02-02 DIAGNOSIS — K8309 Other cholangitis: Secondary | ICD-10-CM

## 2020-02-02 DIAGNOSIS — F1010 Alcohol abuse, uncomplicated: Secondary | ICD-10-CM | POA: Diagnosis present

## 2020-02-02 DIAGNOSIS — Z8249 Family history of ischemic heart disease and other diseases of the circulatory system: Secondary | ICD-10-CM | POA: Diagnosis not present

## 2020-02-02 DIAGNOSIS — Z681 Body mass index (BMI) 19 or less, adult: Secondary | ICD-10-CM

## 2020-02-02 DIAGNOSIS — R109 Unspecified abdominal pain: Secondary | ICD-10-CM | POA: Diagnosis not present

## 2020-02-02 DIAGNOSIS — E039 Hypothyroidism, unspecified: Secondary | ICD-10-CM | POA: Diagnosis present

## 2020-02-02 DIAGNOSIS — F319 Bipolar disorder, unspecified: Secondary | ICD-10-CM | POA: Insufficient documentation

## 2020-02-02 DIAGNOSIS — R1013 Epigastric pain: Secondary | ICD-10-CM | POA: Diagnosis present

## 2020-02-02 DIAGNOSIS — K8020 Calculus of gallbladder without cholecystitis without obstruction: Secondary | ICD-10-CM

## 2020-02-02 LAB — COMPREHENSIVE METABOLIC PANEL
ALT: 152 U/L — ABNORMAL HIGH (ref 0–44)
AST: 176 U/L — ABNORMAL HIGH (ref 15–41)
Albumin: 3.8 g/dL (ref 3.5–5.0)
Alkaline Phosphatase: 313 U/L — ABNORMAL HIGH (ref 38–126)
Anion gap: 13 (ref 5–15)
BUN: 10 mg/dL (ref 8–23)
CO2: 21 mmol/L — ABNORMAL LOW (ref 22–32)
Calcium: 9.2 mg/dL (ref 8.9–10.3)
Chloride: 99 mmol/L (ref 98–111)
Creatinine, Ser: 0.89 mg/dL (ref 0.61–1.24)
GFR calc Af Amer: >60 mL/min (ref >60)
GFR calc non Af Amer: >60 mL/min (ref >60)
Glucose, Bld: 112 mg/dL — ABNORMAL HIGH (ref 70–99)
Potassium: 3.8 mmol/L (ref 3.5–5.1)
Sodium: 133 mmol/L — ABNORMAL LOW (ref 135–145)
Total Bilirubin: 9.7 mg/dL — ABNORMAL HIGH (ref 0.3–1.2)
Total Protein: 7.7 g/dL (ref 6.5–8.1)

## 2020-02-02 LAB — PROTIME-INR
INR: 1.2 (ref 0.8–1.2)
Prothrombin Time: 14.9 seconds (ref 11.4–15.2)

## 2020-02-02 LAB — URINALYSIS, ROUTINE W REFLEX MICROSCOPIC
Glucose, UA: NEGATIVE mg/dL
Ketones, ur: NEGATIVE mg/dL
Leukocytes,Ua: NEGATIVE
Nitrite: NEGATIVE
Protein, ur: NEGATIVE mg/dL
Specific Gravity, Urine: >1.046 — ABNORMAL HIGH (ref 1.005–1.030)
pH: 6 (ref 5.0–8.0)

## 2020-02-02 LAB — CBC
HCT: 45.7 % (ref 39.0–52.0)
Hemoglobin: 15.2 g/dL (ref 13.0–17.0)
MCH: 24.3 pg — ABNORMAL LOW (ref 26.0–34.0)
MCHC: 33.3 g/dL (ref 30.0–36.0)
MCV: 73.1 fL — ABNORMAL LOW (ref 80.0–100.0)
Platelets: 222 10*3/uL (ref 150–400)
RBC: 6.25 MIL/uL — ABNORMAL HIGH (ref 4.22–5.81)
RDW: 14.7 % (ref 11.5–15.5)
WBC: 14.5 10*3/uL — ABNORMAL HIGH (ref 4.0–10.5)
nRBC: 0 % (ref 0.0–0.2)

## 2020-02-02 LAB — HEPATITIS PANEL, ACUTE
HCV Ab: NONREACTIVE
Hep A IgM: NONREACTIVE
Hep B C IgM: NONREACTIVE
Hepatitis B Surface Ag: NONREACTIVE

## 2020-02-02 LAB — ACETAMINOPHEN LEVEL: Acetaminophen (Tylenol), Serum: <10 ug/mL — ABNORMAL LOW (ref 10–30)

## 2020-02-02 LAB — LIPASE, BLOOD: Lipase: 21 U/L (ref 11–51)

## 2020-02-02 LAB — RESPIRATORY PANEL BY RT PCR (FLU A&B, COVID)
Influenza A by PCR: NEGATIVE
Influenza B by PCR: NEGATIVE
SARS Coronavirus 2 by RT PCR: NEGATIVE

## 2020-02-02 LAB — TROPONIN I (HIGH SENSITIVITY)
Troponin I (High Sensitivity): 4 ng/L (ref <18)
Troponin I (High Sensitivity): 5 ng/L (ref <18)

## 2020-02-02 LAB — GLUCOSE, CAPILLARY: Glucose-Capillary: 119 mg/dL — ABNORMAL HIGH (ref 70–99)

## 2020-02-02 LAB — MAGNESIUM: Magnesium: 1.8 mg/dL (ref 1.7–2.4)

## 2020-02-02 MED ORDER — HYDRALAZINE HCL 20 MG/ML IJ SOLN: 10.0000 mg | INTRAMUSCULAR | Status: DC | PRN

## 2020-02-02 MED ORDER — IOHEXOL 300 MG/ML  SOLN
100.0000 mL | Freq: Once | INTRAMUSCULAR | Status: AC | PRN
  Administered 2020-02-02: 18:00:00 100 mL via INTRAVENOUS

## 2020-02-02 MED ORDER — ONDANSETRON HCL 4 MG/2ML IJ SOLN
4.0000 mg | Freq: Once | INTRAMUSCULAR | Status: AC
  Administered 2020-02-02: 4 mg via INTRAVENOUS
  Filled 2020-02-02: qty 2

## 2020-02-02 MED ORDER — SODIUM CHLORIDE 0.9% FLUSH: 3.0000 mL | Freq: Once | INTRAVENOUS | Status: DC

## 2020-02-02 MED ORDER — PIPERACILLIN-TAZOBACTAM 3.375 G IVPB
3.3750 g | Freq: Three times a day (TID) | INTRAVENOUS | Status: DC
  Administered 2020-02-02 – 2020-02-03 (×3): 3.375 g via INTRAVENOUS
  Filled 2020-02-02 (×3): qty 50

## 2020-02-02 MED ORDER — LACTATED RINGERS IV BOLUS
1000.0000 mL | Freq: Once | INTRAVENOUS | Status: AC
  Administered 2020-02-02: 1000 mL via INTRAVENOUS

## 2020-02-02 MED ORDER — ONDANSETRON HCL 4 MG/2ML IJ SOLN: 4.0000 mg | Freq: Four times a day (QID) | INTRAMUSCULAR | Status: DC | PRN

## 2020-02-02 MED ORDER — PIPERACILLIN-TAZOBACTAM 3.375 G IVPB 30 MIN
3.3750 g | Freq: Once | INTRAVENOUS | Status: AC
  Administered 2020-02-02: 3.375 g via INTRAVENOUS
  Filled 2020-02-02: qty 50

## 2020-02-02 MED ORDER — ONDANSETRON HCL 4 MG PO TABS: 4.0000 mg | ORAL_TABLET | Freq: Four times a day (QID) | ORAL | Status: DC | PRN

## 2020-02-02 MED ORDER — DEXTROSE-NACL 5-0.9 % IV SOLN: INTRAVENOUS | Status: AC

## 2020-02-02 MED ORDER — HYDROMORPHONE HCL 1 MG/ML IJ SOLN
0.5000 mg | Freq: Once | INTRAMUSCULAR | Status: AC
  Administered 2020-02-02: 0.5 mg via INTRAVENOUS
  Filled 2020-02-02: qty 1

## 2020-02-02 MED ORDER — LEVOTHYROXINE SODIUM 100 MCG/5ML IV SOLN
67.5000 ug | Freq: Every day | INTRAVENOUS | Status: DC
  Administered 2020-02-03 – 2020-02-08 (×6): 67.5 ug via INTRAVENOUS
  Filled 2020-02-02 (×6): qty 5

## 2020-02-02 NOTE — ED Notes (Signed)
X1 unsuccessful attempt to give report. RN not available at this time

## 2020-02-02 NOTE — ED Provider Notes (Signed)
[facility] EMERGENCY DEPARTMENT Provider Note   CSN: [id number] Arrival date & time: 02/02/20  1138     History Chief Complaint  Patient presents with  . Abdominal Pain    Rodney Romero is a 74 y.o. male.  The history is provided by the patient.  Abdominal Pain Pain location:  RUQ Pain quality: cramping and sharp   Pain severity:  Moderate Onset quality:  Sudden Duration:  1 day Timing:  Constant Progression:  Unchanged Chronicity:  New Context: eating   Context: not previous surgeries and not trauma   Relieved by:  Nothing Worsened by:  Deep breathing and eating Associated symptoms: chills, fever (subjective), nausea and vomiting   Associated symptoms: no chest pain, no constipation, no cough, no diarrhea, no dysuria, no hematemesis, no hematochezia, no hematuria, no melena and no shortness of breath   Risk factors: being elderly   Risk factors: has not had multiple surgeries and not obese        Past Medical History:  Diagnosis Date  . Bipolar 1 disorder ([place])   . Hypertension   . Hypothyroidism   . PTSD (post-traumatic stress disorder)     Patient Active Problem List   Diagnosis Date Noted  . Closed tibia fracture 06/25/2018    Past Surgical History:  Procedure Laterality Date  . GALLBLADDER SURGERY    . TIBIA IM NAIL INSERTION Left 06/26/2018   Procedure: left tibia intramedullary nail;  Surgeon: Gaynelle Arabian, MD;  Location: [facility];  Service: Orthopedics;  Laterality: Left;       Family History  Problem Relation Age of Onset  . Hypertension Mother   . Diabetes Mother   . Seizures Father     Social History   Tobacco Use  . Smoking status: Current Every Day Smoker    Packs/day: 0.50    Types: Cigarettes    Last attempt to quit: 09/03/2014    Years since quitting: 5.4  . Smokeless tobacco: Never Used  Substance Use Topics  . Alcohol use: Yes    Comment: Everyother day   . Drug use: No    Home Medications Prior to  Admission medications   Medication Sig Start Date End Date Taking? Authorizing Provider  amLODipine (NORVASC) 5 MG tablet Take 5 mg by mouth daily.    [provider]  Cyanocobalamin (VITAMIN B-12 PO) Take 1 tablet by mouth daily.    [provider]  Cyanocobalamin (VITAMIN B-12) 5000 MCG TBDP Take 2 tablets by mouth daily.    [provider]  enoxaparin (LOVENOX) 40 MG/0.4ML injection Inject 0.4 mLs (40 mg total) into the skin daily. Inject one syringe of Lovenox once a day for 10 days following surgery. Then resume one baby aspirin once a day. 06/28/18   Edmisten, Kristie L, PA  ferrous sulfate 325 (65 FE) MG tablet Take 325 mg by mouth as directed. On Monday, Wednesday and Friday    [provider]  galantamine (RAZADYNE ER) 8 MG 24 hr capsule Take 8 mg by mouth daily with breakfast.    [provider]  levothyroxine (SYNTHROID, LEVOTHROID) 137 MCG tablet Take 137 mcg by mouth daily before breakfast.    [provider]  methocarbamol (ROBAXIN) 500 MG tablet Take 1 tablet (500 mg total) by mouth every 6 (six) hours as needed for muscle spasms. 06/28/18   Edmisten, Kristie L, PA  mirtazapine (REMERON) 15 MG tablet Take 15 mg by mouth at bedtime.    [provider]  omeprazole (PRILOSEC) 20 MG capsule Take 20 mg by mouth daily.    [provider]  oxyCODONE (OXY IR/ROXICODONE) 5 MG immediate release tablet Take 1-2 tablets (5-10 mg total) by mouth every 6 (six) hours as needed for moderate pain or severe pain. 06/28/18   Edmisten, Ok Anis, PA  traZODone (DESYREL) 50 MG tablet Take 50 mg by mouth at bedtime as needed for sleep.    [provider]    Allergies    Patient has no known allergies.  Review of Systems   Review of Systems  Constitutional: Positive for chills and fever (subjective).  Respiratory: Negative for cough and shortness of breath.   Cardiovascular: Negative for chest pain.  Gastrointestinal:  Positive for abdominal pain, nausea and vomiting. Negative for constipation, diarrhea, hematemesis, hematochezia and melena.  Genitourinary: Negative for dysuria and hematuria.  All other systems reviewed and are negative.   Physical Exam Updated Vital Signs BP 138/74 (BP Location: Right Arm)   Pulse 83   Temp 99.5 F (37.5 C) (Oral)   Resp 17   Ht 5\' 7"  (1.702 m)   Wt 61.7 kg   SpO2 100%   BMI 21.30 kg/m   Physical Exam Vitals and nursing note reviewed.  Constitutional:      Appearance: He is well-developed. He is not toxic-appearing or diaphoretic.  HENT:     Head: Normocephalic and atraumatic.  Eyes:     Conjunctiva/sclera: Conjunctivae normal.  Cardiovascular:     Rate and Rhythm: Normal rate and regular rhythm.     Heart sounds: Normal heart sounds. No murmur.  Pulmonary:     Effort: Pulmonary effort is normal. No respiratory distress.     Breath sounds: Normal breath sounds.  Abdominal:     Palpations: Abdomen is soft.     Tenderness: There is abdominal tenderness in the right upper quadrant. There is right CVA tenderness and guarding. Positive signs include Murphy's sign. Negative signs include Rovsing's sign and McBurney's sign.  Musculoskeletal:     Cervical back: Neck supple.     Right lower leg: No tenderness. No edema.     Left lower leg: No tenderness. No edema.  Skin:    General: Skin is warm and dry.     Capillary Refill: Capillary refill takes less than 2 seconds.  Neurological:     General: No focal deficit present.     Mental Status: He is alert.  Psychiatric:        Mood and Affect: Mood normal.        Behavior: Behavior normal.     ED Results / Procedures / Treatments   Labs (all labs ordered are listed, but only abnormal results are displayed) Labs Reviewed  COMPREHENSIVE METABOLIC PANEL - Abnormal; Notable for the following components:      Result Value   Sodium 133 (*)    CO2 21 (*)    Glucose, Bld 112 (*)    AST 176 (*)    ALT 152  (*)    Alkaline Phosphatase 313 (*)    Total Bilirubin 9.7 (*)    All other components within normal limits  CBC - Abnormal; Notable for the following components:   WBC 14.5 (*)    RBC 6.25 (*)    MCV 73.1 (*)    MCH 24.3 (*)    All other components within normal limits  CULTURE, BLOOD (ROUTINE X 2)  CULTURE, BLOOD (ROUTINE X 2)  LIPASE, BLOOD  URINALYSIS, ROUTINE  W REFLEX MICROSCOPIC    EKG EKG Interpretation  Date/Time:  Monday February 02 2020 11:39:36 EST Ventricular Rate:  86 PR Interval:  148 QRS Duration: 80 QT Interval:  356 QTC Calculation: 426 R Axis:   52 Text Interpretation: Sinus rhythm with Premature atrial complexes Nonspecific T wave abnormality Abnormal ECG No STEMI Confirmed by Octaviano Glow [phone]) on 02/02/2020 3:11:11 PM   Radiology No results found.  Procedures Procedures (including critical care time)  Medications Ordered in ED Medications  sodium chloride flush (NS) 0.9 % injection 3 mL (has no administration in time range)  lactated ringers bolus 1,000 mL (1,000 mLs Intravenous New Bag/Given 02/02/20 1553)  HYDROmorphone (DILAUDID) injection 0.5 mg (0.5 mg Intravenous Given 02/02/20 1550)  ondansetron (ZOFRAN) injection 4 mg (4 mg Intravenous Given 02/02/20 1550)    ED Course  I have reviewed the triage vital signs and the nursing notes.  Pertinent labs & imaging results that were available during my care of the patient were reviewed by me and considered in my medical decision making (see chart for details).  Clinical Course as of Feb 02 2331  Mon Feb 02, 2020  1725 74 yo male presenting to ED with epigastric pain that began yesterday after eating at the [employer].  Referred into ED from urgent care with concern for questionable ST elevations in V2-V4 (this is BEL per my interpretation, no true elevation in V4).  Here found to have evidence of acute cholecystitis on ultrasound, with elevated bilirubin and transminitis as well, concerning for  possible early cholangitis.  Gen surgery consulted.  Patient started on IV zosyn.  He is overall comfortable appearing in the room.  He does have significant RUQ tenderness on exam.  He and his wife were updated about the findings and the plan for admission.   [MT]  [id number] Gen surgery evaluated patient.  They are requesting CT abdomen pelvis to better evaluate liver and biliary tree.  They are not convinced this is acute cholecystitis and are not planning for OR   [MT]    Clinical Course User Index [MT] Trifan, Carola Rhine, MD   MDM Rules/Calculators/A&P                      Here for RUQ pain, emesis after eating [employer]aff[employer] yesterday.  DDx includes cholecystitis, cholangitis, pancreatitis, choledocholithiasis.  Urgent care note reports ST elevation in anterior leads.  EKG consistent with benign early repolarization, no significant change from previous EKG, troponin negative, no not suspect ACS.  RUQ TTP on exam, [place] concerning for cholecystitis.  Elevated LFTs and significantly elevated bilirubin on labs.  Covered with zosyn.    Surgery consulted, Dr. Rosendo Gros evaluated the pt and recommends CT abd/pelvis, medicine admission for GI consult.    Pt admitted to Dr. Hal Hope with hospital medicine.     Final Clinical Impression(s) / ED Diagnoses Final diagnoses:  Cholecystitis, acute  Hyperbilirubinemia    Rx / DC Orders ED Discharge Orders    None       Chyan Carnero, [place], MD 02/02/20 [id number]    Wyvonnia Dusky, MD 02/03/20 0104

## 2020-02-02 NOTE — ED Notes (Signed)
cxr at bedside

## 2020-02-02 NOTE — Discharge Instructions (Addendum)
Report to the ER for further evaluation and treatment.

## 2020-02-02 NOTE — Consult Note (Addendum)
CC: abdominal pain  Requesting provider: Dr. Langston Masker  HPI: Rodney Romero is an 74 y.o. male who is here for abdominal pain.  Patient has history of bipolar disorder, hypertension, hypothyroidism, has a history of smoking 3 cigars a day.  Patient states that his abdominal pain began yesterday.  He states that he had one episode of emesis today.  Patient denies any changes in his urine and/or stool color.  He denies any diarrhea.  Patient states he has had 5 to 10 pound weight loss recently.  He states he has not had pain like this before in the past.  Upon evaluation the ER he underwent ultrasound which showed small calculi, gallbladder wall thickening.  Patient had common bile duct of 3.1 mm.  Ultrasound impression was sonographic acute cholecystitis.  Patient also had an elevated T bili of 9.7, elevated transaminases and elevated alk phos.  General surgery was consulted for further evaluation and management.  Patient denies any family history of any cancer aside from the "leg cancer.  "Patient apparently has undergone treatment for prostate cancer.  This was likely transurethral, as there has been no abdominal incisions.  Patient denies any previous gallbladder surgery as is marked on his past surgical history in epic.  Past Medical History:  Diagnosis Date  . Bipolar 1 disorder ([place])   . Hypertension   . Hypothyroidism   . PTSD (post-traumatic stress disorder)     Past Surgical History:  Procedure Laterality Date  . GALLBLADDER SURGERY    . TIBIA IM NAIL INSERTION Left 06/26/2018   Procedure: left tibia intramedullary nail;  Surgeon: Gaynelle Arabian, MD;  Location: [facility];  Service: Orthopedics;  Laterality: Left;    Family History  Problem Relation Age of Onset  . Hypertension Mother   . Diabetes Mother   . Seizures Father     Social:  reports that he has been smoking cigarettes. He has been smoking about 0.50 packs per day. He has never used smokeless tobacco. He reports  current alcohol use. He reports that he does not use drugs.  Allergies: No Known Allergies  Medications: I have reviewed the patient's current medications.  Results for orders placed or performed during the hospital encounter of 02/02/20 (from the past 48 hour(s))  Lipase, blood     Status: None   Collection Time: 02/02/20 11:50 AM  Result Value Ref Range   Lipase 21 11 - 51 U/L    Comment: Performed at [facility], [place] [address], [place], [place] [address]  Comprehensive metabolic panel     Status: Abnormal   Collection Time: 02/02/20 11:50 AM  Result Value Ref Range   Sodium 133 (L) 135 - 145 mmol/L   Potassium 3.8 3.5 - 5.1 mmol/L   Chloride 99 98 - 111 mmol/L   CO2 21 (L) 22 - 32 mmol/L   Glucose, Bld 112 (H) 70 - 99 mg/dL    Comment: Glucose reference range applies only to samples taken after fasting for at least 8 hours.   BUN 10 8 - 23 mg/dL   Creatinine, Ser 0.89 0.61 - 1.24 mg/dL   Calcium 9.2 8.9 - 10.3 mg/dL   Total Protein 7.7 6.5 - 8.1 g/dL   Albumin 3.8 3.5 - 5.0 g/dL   AST 176 (H) 15 - 41 U/L   ALT 152 (H) 0 - 44 U/L   Alkaline Phosphatase 313 (H) 38 - 126 U/L   Total Bilirubin 9.7 (H) 0.3 - 1.2 mg/dL  GFR calc non Af Amer >60 >60 mL/min   GFR calc Af Amer >60 >60 mL/min   Anion gap 13 5 - 15    Comment: Performed at [facility] [address] [place], [place] [address]  CBC     Status: Abnormal   Collection Time: 02/02/20 11:50 AM  Result Value Ref Range   WBC 14.5 (H) 4.0 - 10.5 K/uL   RBC 6.25 (H) 4.22 - 5.81 MIL/uL   Hemoglobin 15.2 13.0 - 17.0 g/dL   HCT 45.7 39.0 - 52.0 %   MCV 73.1 (L) 80.0 - 100.0 fL   MCH 24.3 (L) 26.0 - 34.0 pg   MCHC 33.3 30.0 - 36.0 g/dL   RDW 14.7 11.5 - 15.5 %   Platelets 222 150 - 400 K/uL   nRBC 0.0 0.0 - 0.2 %    Comment: Performed at [facility], [place] [address], [place], [place] [address]    US Abdomen Limited  Result Date: 02/02/2020 CLINICAL DATA:  Right upper quadrant pain EXAM: ULTRASOUND  ABDOMEN LIMITED RIGHT UPPER QUADRANT COMPARISON:  None. FINDINGS: Gallbladder: Sludge and probable small calculi. Wall thickening measuring 14 mm. There is small volume pericholecystic fluid. Sonographic Percell Miller sign could not be evaluated due to presence of pain medication. Common bile duct: Diameter: 3.1 mm, normal. Liver: No focal lesion identified. Within normal limits in parenchymal echogenicity. Portal vein is patent on color Doppler imaging with normal direction of blood flow towards the liver. Other: None. IMPRESSION: Sonographic evidence of acute cholecystitis. These results were called by telephone at the time of interpretation on 02/02/2020 at 4:27 pm to provider MATTHEW TRIFAN , who verbally acknowledged these results. Electronically Signed   By: Macy Mis M.D.   On: 02/02/2020 16:29    ROS - all of the below systems have been reviewed with the patient and positives are indicated with bold text General: chills, fever or night sweats Eyes: blurry vision or double vision ENT: epistaxis or sore throat Allergy/Immunology: itchy/watery eyes or nasal congestion Hematologic/Lymphatic: bleeding problems, blood clots or swollen lymph nodes Endocrine: temperature intolerance or unexpected weight changes Breast: new or changing breast lumps or nipple discharge Resp: cough, shortness of breath, or wheezing CV: chest pain or dyspnea on exertion GI: as per HPI GU: dysuria, trouble voiding, or hematuria MSK: joint pain or joint stiffness Neuro: TIA or stroke symptoms Derm: pruritus and skin lesion changes Psych: anxiety and depression  PE Blood pressure 138/74, pulse 83, temperature 99.5 F (37.5 C), temperature source Oral, resp. rate 17, height 5' 7"  (1.702 m), weight 61.7 kg, SpO2 100 %. Constitutional: NAD; conversant; no deformities Eyes: Moist conjunctiva; no lid lag; anicteric; PERRL Neck: Trachea midline; no thyromegaly Lungs: Normal respiratory effort; no tactile fremitus CV: RRR;  no palpable thrills; no pitting edema GI: Abd ttp, RUQno rebound/guarding, ND; no palpable hepatosplenomegaly MSK: Normal range of motion of extremities; no clubbing/cyanosis Psychiatric: Appropriate affect; alert and oriented x3 Lymphatic: No palpable cervical or axillary lymphadenopathy  Results for orders placed or performed during the hospital encounter of 02/02/20 (from the past 48 hour(s))  Lipase, blood     Status: None   Collection Time: 02/02/20 11:50 AM  Result Value Ref Range   Lipase 21 11 - 51 U/L    Comment: Performed at [facility], [place] [address], [place], [place] [address]  Comprehensive metabolic panel     Status: Abnormal   Collection Time: 02/02/20 11:50 AM  Result Value  Ref Range   Sodium 133 (L) 135 - 145 mmol/L   Potassium 3.8 3.5 - 5.1 mmol/L   Chloride 99 98 - 111 mmol/L   CO2 21 (L) 22 - 32 mmol/L   Glucose, Bld 112 (H) 70 - 99 mg/dL    Comment: Glucose reference range applies only to samples taken after fasting for at least 8 hours.   BUN 10 8 - 23 mg/dL   Creatinine, Ser 0.89 0.61 - 1.24 mg/dL   Calcium 9.2 8.9 - 10.3 mg/dL   Total Protein 7.7 6.5 - 8.1 g/dL   Albumin 3.8 3.5 - 5.0 g/dL   AST 176 (H) 15 - 41 U/L   ALT 152 (H) 0 - 44 U/L   Alkaline Phosphatase 313 (H) 38 - 126 U/L   Total Bilirubin 9.7 (H) 0.3 - 1.2 mg/dL   GFR calc non Af Amer >60 >60 mL/min   GFR calc Af Amer >60 >60 mL/min   Anion gap 13 5 - 15    Comment: Performed at [facility] [address], [place], [place] [address]  CBC     Status: Abnormal   Collection Time: 02/02/20 11:50 AM  Result Value Ref Range   WBC 14.5 (H) 4.0 - 10.5 K/uL   RBC 6.25 (H) 4.22 - 5.81 MIL/uL   Hemoglobin 15.2 13.0 - 17.0 g/dL   HCT 45.7 39.0 - 52.0 %   MCV 73.1 (L) 80.0 - 100.0 fL   MCH 24.3 (L) 26.0 - 34.0 pg   MCHC 33.3 30.0 - 36.0 g/dL   RDW 14.7 11.5 - 15.5 %   Platelets 222 150 - 400 K/uL   nRBC 0.0 0.0 - 0.2 %    Comment: Performed at [facility], [place] [address], [place], [place] [address]    US Abdomen Limited  Result Date: 02/02/2020 CLINICAL DATA:  Right upper quadrant pain EXAM: ULTRASOUND ABDOMEN LIMITED RIGHT UPPER QUADRANT COMPARISON:  None. FINDINGS: Gallbladder: Sludge and probable small calculi. Wall thickening measuring 14 mm. There is small volume pericholecystic fluid. Sonographic Percell Miller sign could not be evaluated due to presence of pain medication. Common bile duct: Diameter: 3.1 mm, normal. Liver: No focal lesion identified. Within normal limits in parenchymal echogenicity. Portal vein is patent on color Doppler imaging with normal direction of blood flow towards the liver. Other: None. IMPRESSION: Sonographic evidence of acute cholecystitis. These results were called by telephone at the time of interpretation on 02/02/2020 at 4:27 pm to provider MATTHEW TRIFAN , who verbally acknowledged these results. Electronically Signed   By: Macy Mis M.D.   On: 02/02/2020 16:29     A/P: Rodney Romero is an 74 y.o. male with transaminitis, hyperbilirubinemia. HTN Hypothyroid PTSD Bipolar  Would recommend patient undergo CT scan to visualize liver.  Patient does have a history of prostate cancer.  Would also recommend ruling out primary process such as hepatitis.  Patient may require GI consult. Patient also with A. fib in the ER room.  Patient has no history of A. fib previously. Would recommend medical admission to continue with work-up and cardiac issues.  We will follow along.  Ralene Ok, M.D., [facility] Use [email or web address] to contact on call provider

## 2020-02-02 NOTE — ED Triage Notes (Signed)
Pt is here with abdominal pain & vomiting since yesterday. Pt has not taken any meds to relieve discomfort.

## 2020-02-02 NOTE — ED Notes (Signed)
Report given to [place], RN on 6N. All questions answered

## 2020-02-02 NOTE — ED Notes (Signed)
Patient is being discharged from the Urgent [place] and sent to the Emergency Department via wheelchair by staff. Per Provider Faustino Congress, patient is stable but in need of higher level of care due to Abnormal EKG. Patient is aware and verbalizes understanding of plan of care.   Vitals:   02/02/20 1102  BP: (!) 152/69  Pulse: (!) 48  Resp: 15  Temp: 99.3 F (37.4 C)  SpO2: 97%

## 2020-02-02 NOTE — Progress Notes (Signed)
Pharmacy Antibiotic Note  KORBYN SYMMES is a 74 y.o. male admitted on 02/02/2020 with acute cholecystitis.  Pharmacy has been consulted for Zosyn dosing.  Plan:  Zosyn 3.375 gm IV q8hrs (each over 4 hours)  Do not anticipate any need to adjust regimen.  Height: 5\' 7"  (170.2 cm) Weight: 123 lb 0.3 oz (55.8 kg) IBW/kg (Calculated) : 66.1  Temp (24hrs), Avg:99.1 F (37.3 C), Min:98.6 F (37 C), Max:99.5 F (37.5 C)  Recent Labs  Lab 02/02/20 1150  WBC 14.5*  CREATININE 0.89    Estimated Creatinine Clearance: 59.2 mL/min (by C-G formula based on SCr of 0.89 mg/dL).    No Known Allergies  Antimicrobials this admission:  Zosyn 3/1 >>  Microbiology results:  3/1 blood x 2:  3/1 COVID and Flu: negative  Thank you for allowing pharmacy to be a part of this patient's care.  Arty Baumgartner, [place] Phone: [phone] 02/02/2020 9:34 PM

## 2020-02-02 NOTE — ED Notes (Signed)
Pt transported to 6N with all belongings via cart transport with all belongings on monitors. Pt alert, speaking in full sentences. VSS. Breathing easy, non-labored. Equal rise and fall of chest noted.

## 2020-02-02 NOTE — ED Notes (Signed)
Pt transported to US

## 2020-02-02 NOTE — ED Triage Notes (Signed)
Pt reports upper abd pain that began yesterday after eating a [employer]aff[employer], vomitied once no diarrhea. Pt denies any cp or sob.

## 2020-02-02 NOTE — ED Provider Notes (Signed)
[place]    CSN: [id number] Arrival date & time: 02/02/20  1014      History   Chief Complaint Chief Complaint  Patient presents with  . Abdominal Pain  . Vomiting    HPI Rodney Romero is a 74 y.o. male.   Patient presents with wife to this visit today.  Patient sometimes has trouble recalling health history.  Reports nausea and vomiting for the last 2 days.  Reports abdominal pain that has worsened today.  Reports that he has not been able to keep any food down in the last 24 hours.  Reports fatigue, weakness.  Reports that he is still voiding as normal.  Denies headache, shortness of breath, rash, chest pain, other symptoms.  ROS Per HPI  The history is provided by the patient and the spouse.    Past Medical History:  Diagnosis Date  . Bipolar 1 disorder ([place])   . Hypertension   . Hypothyroidism   . PTSD (post-traumatic stress disorder)     Patient Active Problem List   Diagnosis Date Noted  . Closed tibia fracture 06/25/2018    Past Surgical History:  Procedure Laterality Date  . GALLBLADDER SURGERY    . TIBIA IM NAIL INSERTION Left 06/26/2018   Procedure: left tibia intramedullary nail;  Surgeon: Gaynelle Arabian, MD;  Location: [facility];  Service: Orthopedics;  Laterality: Left;       Home Medications    Prior to Admission medications   Medication Sig Start Date End Date Taking? Authorizing Provider  amLODipine (NORVASC) 5 MG tablet Take 5 mg by mouth daily.    [provider]  Cyanocobalamin (VITAMIN B-12 PO) Take 1 tablet by mouth daily.    [provider]  Cyanocobalamin (VITAMIN B-12) 5000 MCG TBDP Take 2 tablets by mouth daily.    [provider]  enoxaparin (LOVENOX) 40 MG/0.4ML injection Inject 0.4 mLs (40 mg total) into the skin daily. Inject one syringe of Lovenox once a day for 10 days following surgery. Then resume one baby aspirin once a day. 06/28/18   Edmisten, Kristie L, PA  ferrous sulfate 325 (65 FE)  MG tablet Take 325 mg by mouth as directed. On Monday, Wednesday and Friday    [provider]  galantamine (RAZADYNE ER) 8 MG 24 hr capsule Take 8 mg by mouth daily with breakfast.    [provider]  levothyroxine (SYNTHROID, LEVOTHROID) 137 MCG tablet Take 137 mcg by mouth daily before breakfast.    [provider]  methocarbamol (ROBAXIN) 500 MG tablet Take 1 tablet (500 mg total) by mouth every 6 (six) hours as needed for muscle spasms. 06/28/18   Edmisten, Kristie L, PA  mirtazapine (REMERON) 15 MG tablet Take 15 mg by mouth at bedtime.    [provider]  omeprazole (PRILOSEC) 20 MG capsule Take 20 mg by mouth daily.    [provider]  oxyCODONE (OXY IR/ROXICODONE) 5 MG immediate release tablet Take 1-2 tablets (5-10 mg total) by mouth every 6 (six) hours as needed for moderate pain or severe pain. 06/28/18   Edmisten, Ok Anis, PA  traZODone (DESYREL) 50 MG tablet Take 50 mg by mouth at bedtime as needed for sleep.    [provider]    Family History Family History  Problem Relation Age of Onset  . Hypertension Mother   . Diabetes Mother   . Seizures Father     Social History Social History   Tobacco Use  .  Smoking status: Current Every Day Smoker    Packs/day: 0.50    Types: Cigarettes    Last attempt to quit: 09/03/2014    Years since quitting: 5.4  . Smokeless tobacco: Never Used  Substance Use Topics  . Alcohol use: Yes    Comment: Everyother day   . Drug use: No     Allergies   Patient has no known allergies.   Review of Systems Review of Systems   Physical Exam Triage Vital Signs ED Triage Vitals  Enc Vitals Group     BP 02/02/20 1102 (!) 152/69     Pulse Rate 02/02/20 1102 (!) 48     Resp 02/02/20 1102 15     Temp 02/02/20 1102 99.3 F (37.4 C)     Temp Source 02/02/20 1102 Oral     SpO2 02/02/20 1102 97 %     Weight 02/02/20 1058 130 lb 9.6 oz (59.2 kg)     Height --      Head Circumference  --      Peak Flow --      Pain Score 02/02/20 1058 10     Pain Loc --      Pain Edu? --      Excl. in [place]? --    No data found.  Updated Vital Signs BP (!) 152/69 (BP Location: Left Arm)   Pulse (!) 48   Temp 99.3 F (37.4 C) (Oral)   Resp 15   Wt 130 lb 9.6 oz (59.2 kg)   SpO2 97%   BMI 20.45 kg/m       Physical Exam Vitals and nursing note reviewed.  Constitutional:      General: He is not in acute distress.    Appearance: He is well-developed. He is ill-appearing.  HENT:     Head: Normocephalic and atraumatic.  Eyes:     Conjunctiva/sclera: Conjunctivae normal.  Cardiovascular:     Rate and Rhythm: Normal rate and regular rhythm.     Heart sounds: Murmur present.  Pulmonary:     Effort: Pulmonary effort is normal. No respiratory distress.     Breath sounds: Normal breath sounds. No stridor. No wheezing, rhonchi or rales.  Abdominal:     General: Abdomen is flat. Bowel sounds are decreased. There is no abdominal bruit.     Palpations: Abdomen is soft.     Tenderness: There is generalized abdominal tenderness.  Musculoskeletal:     Cervical back: Neck supple.  Skin:    General: Skin is warm and dry.     Capillary Refill: Capillary refill takes less than 2 seconds.  Neurological:     General: No focal deficit present.     Mental Status: He is alert.  Psychiatric:        Mood and Affect: Mood normal.        Behavior: Behavior normal.      UC Treatments / Results  Labs (all labs ordered are listed, but only abnormal results are displayed) Labs Reviewed  NOVEL CORONAVIRUS, NAA (HOSP ORDER, SEND-OUT TO REF LAB; TAT 18-24 HRS)    EKG   Radiology No results found.  Procedures Procedures (including critical care time)  Medications Ordered in UC Medications - No data to display  Initial Impression / Assessment and Plan / UC Course  I have reviewed the triage vital signs and the nursing notes.  Pertinent labs & imaging results that were available  during my care of the patient were reviewed by me  and considered in my medical decision making (see chart for details).     Presents today for nausea and vomiting with generalized abdominal tenderness, but much worse in right upper quadrant with positive Murphy sign. Send out Covid test obtained and will inform patient of results when they are back.  Also noted irregular heart rhythm on auscultation during exam.  Patient denies cardiac history, other than hypertension.  EKG abnormal this visit with ST elevation in V2 V3 V4.  Heart rate on exam was also ranging from 45-50.  Concerns about cardiac changes, possible gallbladder issues, dehydration.  Patient wheeled to ER from our office, stable at this time. Final Clinical Impressions(s) / UC Diagnoses   Final diagnoses:  Generalized abdominal pain  Nausea and vomiting, intractability of vomiting not specified, unspecified vomiting type  Bradycardia  Abnormal EKG  Irregular heart rhythm  Weakness  Other fatigue     Discharge Instructions     Reports to the ER for further evaluation and treatment.    ED Prescriptions    None     PDMP not reviewed this encounter.   Faustino Congress, NP 02/02/20 1139

## 2020-02-03 ENCOUNTER — Encounter (HOSPITAL_COMMUNITY): Payer: Self-pay | Admitting: Internal Medicine

## 2020-02-03 ENCOUNTER — Inpatient Hospital Stay (HOSPITAL_COMMUNITY): Payer: Medicare Other

## 2020-02-03 DIAGNOSIS — R109 Unspecified abdominal pain: Secondary | ICD-10-CM

## 2020-02-03 DIAGNOSIS — R7989 Other specified abnormal findings of blood chemistry: Secondary | ICD-10-CM

## 2020-02-03 DIAGNOSIS — I10 Essential (primary) hypertension: Secondary | ICD-10-CM

## 2020-02-03 DIAGNOSIS — K810 Acute cholecystitis: Principal | ICD-10-CM

## 2020-02-03 LAB — CBC WITH DIFFERENTIAL/PLATELET
Abs Immature Granulocytes: 0.1 10*3/uL — ABNORMAL HIGH (ref 0.00–0.07)
Basophils Absolute: 0.1 10*3/uL (ref 0.0–0.1)
Basophils Relative: 0 %
Eosinophils Absolute: 0 10*3/uL (ref 0.0–0.5)
Eosinophils Relative: 0 %
HCT: 38.7 % — ABNORMAL LOW (ref 39.0–52.0)
Hemoglobin: 13.2 g/dL (ref 13.0–17.0)
Immature Granulocytes: 1 %
Lymphocytes Relative: 9 %
Lymphs Abs: 1.4 10*3/uL (ref 0.7–4.0)
MCH: 24.2 pg — ABNORMAL LOW (ref 26.0–34.0)
MCHC: 34.1 g/dL (ref 30.0–36.0)
MCV: 71 fL — ABNORMAL LOW (ref 80.0–100.0)
Monocytes Absolute: 1.2 10*3/uL — ABNORMAL HIGH (ref 0.1–1.0)
Monocytes Relative: 8 %
Neutro Abs: 13.5 10*3/uL — ABNORMAL HIGH (ref 1.7–7.7)
Neutrophils Relative %: 82 %
Platelets: 165 10*3/uL (ref 150–400)
RBC: 5.45 MIL/uL (ref 4.22–5.81)
RDW: 14.3 % (ref 11.5–15.5)
WBC: 16.3 10*3/uL — ABNORMAL HIGH (ref 4.0–10.5)
nRBC: 0 % (ref 0.0–0.2)

## 2020-02-03 LAB — BASIC METABOLIC PANEL
Anion gap: 9 (ref 5–15)
BUN: 10 mg/dL (ref 8–23)
CO2: 23 mmol/L (ref 22–32)
Calcium: 8.6 mg/dL — ABNORMAL LOW (ref 8.9–10.3)
Chloride: 102 mmol/L (ref 98–111)
Creatinine, Ser: 1.03 mg/dL (ref 0.61–1.24)
GFR calc Af Amer: >60 mL/min (ref >60)
GFR calc non Af Amer: >60 mL/min (ref >60)
Glucose, Bld: 96 mg/dL (ref 70–99)
Potassium: 3.7 mmol/L (ref 3.5–5.1)
Sodium: 134 mmol/L — ABNORMAL LOW (ref 135–145)

## 2020-02-03 LAB — HEPATIC FUNCTION PANEL
ALT: 84 U/L — ABNORMAL HIGH (ref 0–44)
AST: 64 U/L — ABNORMAL HIGH (ref 15–41)
Albumin: 3 g/dL — ABNORMAL LOW (ref 3.5–5.0)
Alkaline Phosphatase: 213 U/L — ABNORMAL HIGH (ref 38–126)
Bilirubin, Direct: 4.7 mg/dL — ABNORMAL HIGH (ref 0.0–0.2)
Indirect Bilirubin: 2.9 mg/dL — ABNORMAL HIGH (ref 0.3–0.9)
Total Bilirubin: 7.6 mg/dL — ABNORMAL HIGH (ref 0.3–1.2)
Total Protein: 6.4 g/dL — ABNORMAL LOW (ref 6.5–8.1)

## 2020-02-03 LAB — GLUCOSE, CAPILLARY
Glucose-Capillary: 94 mg/dL (ref 70–99)
Glucose-Capillary: 110 mg/dL — ABNORMAL HIGH (ref 70–99)

## 2020-02-03 LAB — PROTIME-INR
INR: 1.3 — ABNORMAL HIGH (ref 0.8–1.2)
Prothrombin Time: 15.8 seconds — ABNORMAL HIGH (ref 11.4–15.2)

## 2020-02-03 LAB — TSH: TSH: 0.069 u[IU]/mL — ABNORMAL LOW (ref 0.350–4.500)

## 2020-02-03 MED ORDER — SODIUM CHLORIDE 0.9 % IV SOLN
1.5000 g | Freq: Four times a day (QID) | INTRAVENOUS | Status: DC
  Administered 2020-02-03 – 2020-02-10 (×28): 1.5 g via INTRAVENOUS
  Filled 2020-02-03: qty 4
  Filled 2020-02-03: qty 1.5
  Filled 2020-02-03: qty 4
  Filled 2020-02-03: qty 1.5
  Filled 2020-02-03 (×4): qty 4
  Filled 2020-02-03: qty 1.5
  Filled 2020-02-03: qty 4
  Filled 2020-02-03: qty 1.5
  Filled 2020-02-03 (×3): qty 4
  Filled 2020-02-03: qty 1.5
  Filled 2020-02-03 (×3): qty 4
  Filled 2020-02-03: qty 1.5
  Filled 2020-02-03: qty 4
  Filled 2020-02-03: qty 1.5
  Filled 2020-02-03 (×2): qty 4
  Filled 2020-02-03: qty 1.5
  Filled 2020-02-03: qty 4
  Filled 2020-02-03: qty 1.5
  Filled 2020-02-03 (×2): qty 4
  Filled 2020-02-03 (×4): qty 1.5
  Filled 2020-02-03: qty 4

## 2020-02-03 MED ORDER — FOLIC ACID 1 MG PO TABS
1.0000 mg | ORAL_TABLET | Freq: Every day | ORAL | Status: DC
  Administered 2020-02-06 – 2020-02-10 (×5): 1 mg via ORAL
  Filled 2020-02-03 (×6): qty 1

## 2020-02-03 MED ORDER — THIAMINE HCL 100 MG/ML IJ SOLN
100.0000 mg | Freq: Every day | INTRAMUSCULAR | Status: DC
  Administered 2020-02-03 – 2020-02-10 (×6): 100 mg via INTRAVENOUS
  Filled 2020-02-03 (×6): qty 2

## 2020-02-03 MED ORDER — ADULT MULTIVITAMIN W/MINERALS CH
1.0000 | ORAL_TABLET | Freq: Every day | ORAL | Status: DC
  Administered 2020-02-06 – 2020-02-10 (×5): 1 via ORAL
  Filled 2020-02-03 (×6): qty 1

## 2020-02-03 MED ORDER — ACETAMINOPHEN 500 MG PO TABS
500.0000 mg | ORAL_TABLET | ORAL | Status: DC | PRN
  Administered 2020-02-03 – 2020-02-04 (×3): 500 mg via ORAL
  Filled 2020-02-03 (×4): qty 1

## 2020-02-03 MED ORDER — THIAMINE HCL 100 MG PO TABS
100.0000 mg | ORAL_TABLET | Freq: Every day | ORAL | Status: DC
  Administered 2020-02-06 – 2020-02-09 (×2): 100 mg via ORAL
  Filled 2020-02-03 (×5): qty 1

## 2020-02-03 MED ORDER — MORPHINE SULFATE (PF) 2 MG/ML IV SOLN
2.0000 mg | INTRAVENOUS | Status: DC | PRN
  Administered 2020-02-03 – 2020-02-06 (×8): 2 mg via INTRAVENOUS
  Filled 2020-02-03 (×8): qty 1

## 2020-02-03 MED ORDER — LORAZEPAM 1 MG PO TABS: 1.0000 mg | ORAL_TABLET | ORAL | Status: AC | PRN

## 2020-02-03 MED ORDER — LORAZEPAM 2 MG/ML IJ SOLN: 1.0000 mg | INTRAMUSCULAR | Status: AC | PRN

## 2020-02-03 MED ORDER — SODIUM CHLORIDE 0.9 % IV BOLUS
500.0000 mL | Freq: Once | INTRAVENOUS | Status: AC
  Administered 2020-02-03: 500 mL via INTRAVENOUS

## 2020-02-03 NOTE — H&P (Signed)
History and Physical    Rodney Romero [id number] DOB: 1946/11/11 DOA: 02/02/2020  PCP: [facility]  Patient coming from: Home.  Chief Complaint: Abdominal pain.  HPI: Rodney Romero is a 74 y.o. male with history of hypertension hypothyroidism bipolar disorder presents to the ER because of abdominal pain.  Patient states he has been abdominal pain mostly in the right upper quadrant for the last 2 days.  Has some subjective feeling of fever chills and diaphoresis.  Denies chest pain shortness of breath.  Denies any vomiting or diarrhea.  ED Course: On exam in the ER he has right upper quadrant tenderness.  Sonogram the abdomen shows features concerning for cholecystitis General surgery was consulted.  The patient has marked elevated total bilirubin the recommended CT abdomen pelvis and may need GI consult.  Labs show AST 176 ALT 152 creatinine 1.8 alkaline phosphatase 313 total bilirubin 9.7.  CBC shows WBC of 14.5 high sensitive troponin was 5 and 4 EKG shows normal sinus rhythm.  Patient was started on Zosyn IV fluids pain medication admitted for further management.  Covid test was negative.  Review of Systems: As per HPI, rest all negative.   Past Medical History:  Diagnosis Date  . Bipolar 1 disorder ([place])   . Hypertension   . Hypothyroidism   . PTSD (post-traumatic stress disorder)     Past Surgical History:  Procedure Laterality Date  . GALLBLADDER SURGERY    . TIBIA IM NAIL INSERTION Left 06/26/2018   Procedure: left tibia intramedullary nail;  Surgeon: Gaynelle Arabian, MD;  Location: [facility];  Service: Orthopedics;  Laterality: Left;     reports that he has been smoking cigarettes. He has been smoking about 0.50 packs per day. He has never used smokeless tobacco. He reports current alcohol use. He reports that he does not use drugs.  No Known Allergies  Family History  Problem Relation Age of Onset  . Hypertension Mother   . Diabetes Mother   . Seizures  Father     Prior to Admission medications   Medication Sig Start Date End Date Taking? Authorizing Provider  aspirin EC 81 MG tablet Take 81 mg by mouth daily.   Yes [provider]  amLODipine (NORVASC) 5 MG tablet Take 5 mg by mouth daily.    [provider]  Cyanocobalamin (VITAMIN B-12 PO) Take 1 tablet by mouth daily.    [provider]  Cyanocobalamin (VITAMIN B-12) 5000 MCG TBDP Take 2 tablets by mouth daily.    [provider]  enoxaparin (LOVENOX) 40 MG/0.4ML injection Inject 0.4 mLs (40 mg total) into the skin daily. Inject one syringe of Lovenox once a day for 10 days following surgery. Then resume one baby aspirin once a day. 06/28/18   Edmisten, Kristie L, PA  ferrous sulfate 325 (65 FE) MG tablet Take 325 mg by mouth as directed. On Monday, Wednesday and Friday    [provider]  galantamine (RAZADYNE ER) 8 MG 24 hr capsule Take 8 mg by mouth daily with breakfast.    [provider]  levothyroxine (SYNTHROID, LEVOTHROID) 137 MCG tablet Take 137 mcg by mouth daily before breakfast.    [provider]  methocarbamol (ROBAXIN) 500 MG tablet Take 1 tablet (500 mg total) by mouth every 6 (six) hours as needed for muscle spasms. 06/28/18   Edmisten, Kristie L, PA  mirtazapine (REMERON) 15 MG tablet Take 15 mg by mouth at bedtime.    [provider]  omeprazole (PRILOSEC) 20 MG capsule Take 20 mg by mouth daily.    [provider]  oxyCODONE (OXY IR/ROXICODONE) 5 MG immediate release tablet Take 1-2 tablets (5-10 mg total) by mouth every 6 (six) hours as needed for moderate pain or severe pain. 06/28/18   Edmisten, Ok Anis, PA  traZODone (DESYREL) 50 MG tablet Take 50 mg by mouth at bedtime as needed for sleep.    [provider]    Physical Exam: Constitutional: Moderately built and nourished. Vitals:   02/02/20 1146 02/02/20 2038 02/02/20 2102 02/03/20 0418  BP: 138/74 111/74 (!) 144/65 120/66    Pulse: 83 69 68 77  Resp: 17 17 18 17   Temp: 99.5 F (37.5 C)  98.6 F (37 C) 99.2 F (37.3 C)  TempSrc: Oral  Oral Oral  SpO2: 100% 95% 100% 96%  Weight: 61.7 kg  55.8 kg   Height: 5\' 7"  (1.702 m)  5\' 7"  (1.702 m)    Eyes: Anicteric no pallor. ENMT: No discharge from the other ear nose or mouth. Neck: No masses.  No neck rigidity. Respiratory: No rhonchi or crepitations. Cardiovascular: S1-S2 heard. Abdomen: Right upper quadrant tenderness no guarding or rigidity. Musculoskeletal no edema. Skin: No rash. Neurologic: Alert awake oriented to time place and person.  Moves all extremities. Psychiatric: Appears normal with normal affect.   Labs on Admission: I have personally reviewed following labs and imaging studies  CBC: Recent Labs  Lab 02/02/20 1150 02/03/20 0325  WBC 14.5* 16.3*  NEUTROABS  --  13.5*  HGB 15.2 13.2  HCT 45.7 38.7*  MCV 73.1* 71.0*  PLT 222 [id number]   Basic Metabolic Panel: Recent Labs  Lab 02/02/20 1150 02/02/20 1935 02/03/20 0325  NA 133*  --  134*  K 3.8  --  3.7  CL 99  --  102  CO2 21*  --  23  GLUCOSE 112*  --  96  BUN 10  --  10  CREATININE 0.89  --  1.03  CALCIUM 9.2  --  8.6*  MG  --  1.8  --    GFR: Estimated Creatinine Clearance: 51.2 mL/min (by C-G formula based on SCr of 1.03 mg/dL). Liver Function Tests: Recent Labs  Lab 02/02/20 1150 02/03/20 0325  AST 176* 64*  ALT 152* 84*  ALKPHOS 313* 213*  BILITOT 9.7* 7.6*  PROT 7.7 6.4*  ALBUMIN 3.8 3.0*   Recent Labs  Lab 02/02/20 1150  LIPASE 21   No results for input(s): AMMONIA in the last 168 hours. Coagulation Profile: Recent Labs  Lab 02/02/20 2143 02/03/20 0325  INR 1.2 1.3*   Cardiac Enzymes: No results for input(s): CKTOTAL, CKMB, CKMBINDEX, TROPONINI in the last 168 hours. BNP (last 3 results) No results for input(s): PROBNP in the last 8760 hours. HbA1C: No results for input(s): HGBA1C in the last 72 hours. CBG: Recent Labs  Lab 02/02/20 2346   GLUCAP 119*   Lipid Profile: No results for input(s): CHOL, HDL, LDLCALC, TRIG, CHOLHDL, LDLDIRECT in the last 72 hours. Thyroid Function Tests: No results for input(s): TSH, T4TOTAL, FREET4, T3FREE, THYROIDAB in the last 72 hours. Anemia Panel: No results for input(s): VITAMINB12, FOLATE, FERRITIN, TIBC, IRON, RETICCTPCT in the last 72 hours. Urine analysis:    Component Value Date/Time   COLORURINE AMBER (A) 02/02/2020 1830   APPEARANCEUR CLEAR 02/02/2020 1830   LABSPEC >1.046 (H) 02/02/2020 1830   PHURINE 6.0 02/02/2020 1830   GLUCOSEU NEGATIVE 02/02/2020 1830   HGBUR  SMALL (A) 02/02/2020 1830   BILIRUBINUR SMALL (A) 02/02/2020 1830   KETONESUR NEGATIVE 02/02/2020 1830   PROTEINUR NEGATIVE 02/02/2020 1830   UROBILINOGEN 1.0 09/04/2015 2309   NITRITE NEGATIVE 02/02/2020 1830   LEUKOCYTESUR NEGATIVE 02/02/2020 1830   Sepsis Labs: @LABRCNTIP (procalcitonin:4,lacticidven:4) ) Recent Results (from the past 240 hour(s))  Respiratory Panel by RT PCR (Flu A&B, Covid) - Nasopharyngeal Swab     Status: None   Collection Time: 02/02/20  6:22 PM   Specimen: Nasopharyngeal Swab  Result Value Ref Range Status   SARS Coronavirus 2 by RT PCR NEGATIVE NEGATIVE Final    Comment: (NOTE) SARS-CoV-2 target nucleic acids are NOT DETECTED. The SARS-CoV-2 RNA is generally detectable in upper respiratoy specimens during the acute phase of infection. The lowest concentration of SARS-CoV-2 viral copies this assay can detect is 131 copies/mL. A negative result does not preclude SARS-Cov-2 infection and should not be used as the sole basis for treatment or other patient management decisions. A negative result may occur with  improper specimen collection/handling, submission of specimen other than nasopharyngeal swab, presence of viral mutation(s) within the areas targeted by this assay, and inadequate number of viral copies (<131 copies/mL). A negative result must be combined with  clinical observations, patient history, and epidemiological information. The expected result is Negative. Fact Sheet for Patients:  PinkCheek.be Fact Sheet for Healthcare Providers:  GravelBags.it This test is not yet ap proved or cleared by the [place] FDA and  has been authorized for detection and/or diagnosis of SARS-CoV-2 by FDA under an Emergency Use Authorization (EUA). This EUA will remain  in effect (meaning this test can be used) for the duration of the COVID-19 declaration under Section 564(b)(1) of the Act, 21 U.S.C. section 360bbb-3(b)(1), unless the authorization is terminated or revoked sooner.    Influenza A by PCR NEGATIVE NEGATIVE Final   Influenza B by PCR NEGATIVE NEGATIVE Final    Comment: (NOTE) The Xpert Xpress SARS-CoV-2/FLU/RSV assay is intended as an aid in  the diagnosis of influenza from Nasopharyngeal swab specimens and  should not be used as a sole basis for treatment. Nasal washings and  aspirates are unacceptable for Xpert Xpress SARS-CoV-2/FLU/RSV  testing. Fact Sheet for Patients: PinkCheek.be Fact Sheet for Healthcare Providers: GravelBags.it This test is not yet approved or cleared by the [place] FDA and  has been authorized for detection and/or diagnosis of SARS-CoV-2 by  FDA under an Emergency Use Authorization (EUA). This EUA will remain  in effect (meaning this test can be used) for the duration of the  Covid-19 declaration under Section 564(b)(1) of the Act, 21  U.S.C. section 360bbb-3(b)(1), unless the authorization is  terminated or revoked. Performed at [facility], [place] [address] [place], [place] [address]      Radiological Exams on Admission: CT ABDOMEN PELVIS W CONTRAST  Result Date: 02/02/2020 CLINICAL DATA:  Clinical suspicion for abdominal abscess or infection. Possible cholangitis or liver  infection or gallbladder disease. Patient reports upper abdominal pain since yesterday with 1 episode of vomiting. EXAM: CT ABDOMEN AND PELVIS WITH CONTRAST TECHNIQUE: Multidetector CT imaging of the abdomen and pelvis was performed using the standard protocol following bolus administration of intravenous contrast. Automatic exposure control utilized. CONTRAST:  153mL OMNIPAQUE IOHEXOL 300 MG/ML  SOLN COMPARISON:  December 18, 2013. Ultrasonography performed earlier on the same date. FINDINGS: Lower chest: Normal heart size. Mild coronary and thoracic aorta atherosclerotic calcification. Minimal subpleural atelectasis. Hepatobiliary: Abnormal 4.3 cm gallbladder dilatation with abnormal  9 mm gallbladder wall thickening and moderate amount of paracholecystic free fluid that extends into the right paracolic gutter and pelvis. Mild intrahepatic biliary duct dilatation. Normal 5 mm caliber of the common bile duct with distal tapering. No calcified gallstone. Upper limits of normal hepatic size with normal smooth hepatic contours. A tiny 3 mm hypoattenuation in the medial left hemiliver, likely a benign cyst or hemangioma based on size criteria in a patient without known malignancy; no further imaging is necessary. Pancreas: Normal pancreatic parenchyma and main pancreatic duct. Possible pancreatic divisum, series 3 images 35 and 36. No evidence for acute or chronic pancreatitis. Spleen: Normal splenic enhancement and contours. Small spleen measuring 4.4 x 7.5 by 7.0 cm. Adrenals/Urinary Tract: Normal bilateral adrenal glands and ureters. Focal wedge-shaped scarring with hypoenhancement in both renal upper poles. A multi loculated right renal lower pole cyst with multiple septations and course mural calcification measuring 3.9 by 3.3 by 2.7 cm; increased in size from 2.0 by 2.9 by 2.3 cm in 2015. Stomach/Bowel: Nonobstructed small bowel that is mostly decompressed. Mild fluid-distension and mild mucosal thickening of the  duodenal bulb and C-loop, likely a focal ileus and secondary inflammatory change from the adjacent gallbladder fossa. Normal appendix, coronal 32. Paucity of stool. No apparent colorectal wall thickening. Vascular/Lymphatic: Aorto bi-iliac calcified atherosclerosis without and abdominal aortic aneurysm. Reproductive: Prostatomegaly with mass effect on the posterior urinary bladder wall. Focal 7 mm fluid distension of the prostatic urethra, likely a small urethrocele. Other: Small left direct inguinal herniation of fat without strangulation or incarceration. Musculoskeletal: Mild skeletal degenerative change. IMPRESSION: Redemonstration of an acute cholecystitis with abnormal gallbladder dilatation, wall thickening, and free fluid in the abdomen and pelvis. Ileus pattern in the proximal duodenum and likely secondary mild duodenitis. Mild intrahepatic biliary duct dilatation. Normal 5 mm caliber of the common bile duct with distal tapering. No calcified gallstones or calcified choledocholithiasis. Given the noncalcified stones or sludge on recent ultrasonography, further evaluation with MRCP or ERCP could be considered. Possible pancreatic divisum. No acute pancreatitis. A Bosniak IIF right renal lower pole cyst increased in size since 2015 measuring up to 3.9 cm. Surveillance ultrasonography recommended in 6-9 months. Thoracoabdominal aortic calcified atherosclerosis. Mild coronary calcification. Prostatomegaly with small urethrocele. Electronically Signed   By: Revonda Humphrey   On: 02/02/2020 18:33   US Abdomen Limited  Result Date: 02/02/2020 CLINICAL DATA:  Right upper quadrant pain EXAM: ULTRASOUND ABDOMEN LIMITED RIGHT UPPER QUADRANT COMPARISON:  None. FINDINGS: Gallbladder: Sludge and probable small calculi. Wall thickening measuring 14 mm. There is small volume pericholecystic fluid. Sonographic Percell Miller sign could not be evaluated due to presence of pain medication. Common bile duct: Diameter: 3.1 mm, normal.  Liver: No focal lesion identified. Within normal limits in parenchymal echogenicity. Portal vein is patent on color Doppler imaging with normal direction of blood flow towards the liver. Other: None. IMPRESSION: Sonographic evidence of acute cholecystitis. These results were called by telephone at the time of interpretation on 02/02/2020 at 4:27 pm to provider MATTHEW TRIFAN , who verbally acknowledged these results. Electronically Signed   By: Macy Mis M.D.   On: 02/02/2020 16:29   DG Chest Portable 1 View  Result Date: 02/02/2020 CLINICAL DATA:  74 year old male with fever and tachycardia. EXAM: PORTABLE CHEST 1 VIEW COMPARISON:  Chest radiograph dated 06/25/2018. FINDINGS: No focal consolidation, pleural effusion, pneumothorax. The cardiac silhouette is within limits. Atherosclerotic calcification of the aorta. No acute osseous pathology. IMPRESSION: No acute cardiopulmonary process. Electronically Signed  By: Anner Crete M.D.   On: 02/02/2020 19:30    EKG: Independently reviewed.  Normal sinus rhythm.  Assessment/Plan Principal Problem:   Abdominal pain Active Problems:   Elevated LFTs   Hypertension   Cholecystitis, acute    1. Abdominal pain likely from acute cholecystitis however since patient's bilirubin is elevated with normal LFTs CT abdomen pelvis was done which shows features again concerning for acute cholecystitis with some intrahepatic biliary ductal dilatation.  Will follow LFTs keep n.p.o. continue antibiotics will check MRCP.  General surgery has been consulted may need GI consult in the morning. 2. Elevated LFTs may have to rule out any obstruction in the CBD.  Will get MRCP.  Acute hepatitis panel is pending. 3. Hypertension we will keep patient on as needed IV hydralazine. 4. Hypothyroidism on IV Synthroid until patient can take orally. 5. Patient states he drinks alcohol every day we will keep patient on CIWA.  Advised about quitting.   Given that patient has  acute cholecystitis with loculated LFTs and need close monitoring for any deterioration in inpatient status.  DVT prophylaxis: SCDs in anticipation of possible procedure or avoiding anticoagulation. Code Status: Full code. Family Communication: Discussed with patient. Disposition Plan: Home. Consults called: General surgery. Admission status: Inpatient.   Rise Patience MD [facility] Pager [phone].  If 7PM-7AM, please contact night-coverage [email or web address] Password [facility]  02/03/2020, 5:15 AM

## 2020-02-03 NOTE — Plan of Care (Signed)

## 2020-02-03 NOTE — Progress Notes (Addendum)
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, EtOH, HTN, possible dementia, reflux, B12 deficiency admitted with right upper quadrant 2 days PTA Tells me he drinks about 24 ounces of alcohol daily usually is independent   Ultra sonogram = cholecystitis General surgery consulted Labs as per my partner White count 16, TSH 0.069 EKG reviewed by me and my partner did not show anything other than sinus rhythm CT requested by general surgery showed acute cholecystitis with abnormal gallbladder dilatation free fluid in abdomen pelvis with ileus pattern and proximal duodenum MRCP be done later this morning  On exam BP 120/66 (BP Location: Left Arm)   Pulse 77   Temp 99.2 F (37.3 C) (Oral)   Resp 17   Ht 5' 7"  (1.702 m)   Wt 55.8 kg   SpO2 96%   BMI 19.27 kg/m  Awake alert coherent no distress EOMI NCAT S1-S2 no murmur Left finger amputation Abdomen slightly distended no rebound no guarding Chest clinically clear no added sound S1-S2 no murmur rub or gallop seems to be in sinus  P   Await MRCP and decision from surgery regarding plan for cholecystectomy Repeat labs including lipase a.m.  Reviewed labs note mixed hyperbilinemia which is improving slowly-alk phos and transaminases down from admit--would cover with Unasyn with the DDX Asc Cholangitis in my mind as unifying diagnosis  given AAM , will get Sickle trait-LDH and Haptoglobin + Hepatitis series Doubtful [facility] or Bobbe Medico Johnson--if further concerns would also get ASMA  I have called GI DR. Magod to see patient in consult re: Need for possible ERCP  Verneita Griffes, MD [facility] 10:22 AM

## 2020-02-03 NOTE — TOC Initial Note (Signed)
Transition of Care [facility]) - Initial/Assessment Note    Patient Details  Name: Rodney Romero MRN: [id number] Date of Birth: 09/11/1946  Transition of Care [facility]) CM/SW Contact:    Alexander Mt, LCSW Phone Number: 02/03/2020, 11:50 AM  Clinical Narrative:                 CSW spoke with pt at bedside. Introduced self, role, reason for visit. Pt from home with his wife. Confirmed home address and PCP. Pt has a cane and walker but states he doesn't use them. Denies any current needs with transportation/medications. Discussed with pt consult for substance use. Pt does use ETOH, denies daily use and states "I can quit." Let pt know that we are available to provide assistance with resources as desired. Pt states understanding.  Expected Discharge Plan: Home/Self Care Barriers to Discharge: Continued Medical Work up   Patient Goals and CMS Choice Patient states their goals for this hospitalization and ongoing recovery are:: return home when ready CMS Medicare.gov Compare Post Acute Care list provided to:: (n/a) Choice offered to / list presented to : NA  Expected Discharge Plan and Services Expected Discharge Plan: Home/Self Care In-house Referral: Clinical Social Work Discharge Planning Services: CM Consult Living arrangements for the past 2 months: Single Family Home  Prior Living Arrangements/Services Living arrangements for the past 2 months: Single Family Home Lives with:: Spouse Patient language and need for interpreter reviewed:: Yes(no needs) Do you feel safe going back to the place where you live?: Yes      Need for Family Participation in Patient Care: Yes (Comment)(assistance with daily cares as needed) Care giver support system in place?: Yes (comment)(pt wife) Current home services: DME Criminal Activity/Legal Involvement Pertinent to Current Situation/Hospitalization: No - Comment as needed  Permission Sought/Granted Permission sought to share information with : Family  Supports Permission granted to share information with : Yes, Verbal Permission Granted  Share Information with NAME: Anvay Arakelian  Permission granted to share info w Relationship: spouse  Permission granted to share info w Contact Information: [phone]  Emotional Assessment Appearance:: Appears stated age Attitude/Demeanor/Rapport: Engaged Affect (typically observed): Adaptable, Accepting, Appropriate Orientation: : Oriented to Self, Oriented to Place, Oriented to  Time, Oriented to Situation Alcohol / Substance Use: Alcohol Use Psych Involvement: (unknown at this time)  Admission diagnosis:  Abdominal pain [R10.9] Patient Active Problem List   Diagnosis Date Noted  . Cholecystitis, acute   . Abdominal pain 02/02/2020  . Elevated LFTs 02/02/2020  . Hypertension 02/02/2020  . Closed tibia fracture 06/25/2018   PCP:  [place]:   [facility] [place], [place] [address] [place] [address] Phone: [phone] Fax: [place], [place] - [place] [phone] [place] [address] Phone: [phone] Fax: [phone]  Readmission Risk Interventions Readmission Risk Prevention Plan 02/03/2020  Post Dischage Appt Not Complete  Appt Comments disposition pending  Medication Screening Complete  Transportation Screening Complete  Some recent data might be hidden

## 2020-02-03 NOTE — Consult Note (Signed)
Reason for Consult: Abnormal MRCP Referring Physician: Hospital and surgical team  Rodney Romero is an 74 y.o. male.  HPI: Patient seen and examined and discussed with hospital team as well as interventional radiology and surgical team and case also discussed with his wife and he was fine without any GI complaints although he has had some previous GI work-up at the [place] years ago when after eating at [facility] had significant onset of right upper quadrant pain and may be is lost a few pounds lately but otherwise had no other complaints and denies ever being told his liver tests were up but he is followed at the [place] and none previously were found in our computer which was reviewed and currently he is doing a little better than admission  Past Medical History:  Diagnosis Date  . Bipolar 1 disorder ([place])   . Hypertension   . Hypothyroidism   . PTSD (post-traumatic stress disorder)     Past Surgical History:  Procedure Laterality Date  . GALLBLADDER SURGERY    . TIBIA IM NAIL INSERTION Left 06/26/2018   Procedure: left tibia intramedullary nail;  Surgeon: Gaynelle Arabian, MD;  Location: [facility];  Service: Orthopedics;  Laterality: Left;    Family History  Problem Relation Age of Onset  . Hypertension Mother   . Diabetes Mother   . Seizures Father     Social History:  reports that he has been smoking cigarettes. He has been smoking about 0.50 packs per day. He has never used smokeless tobacco. He reports current alcohol use. He reports that he does not use drugs.  Allergies: No Known Allergies  Medications: I have reviewed the patient's current medications.  Results for orders placed or performed during the hospital encounter of 02/02/20 (from the past 48 hour(s))  Lipase, blood     Status: None   Collection Time: 02/02/20 11:50 AM  Result Value Ref Range   Lipase 21 11 - 51 U/L    Comment: Performed at [facility], [place] [address], [place], [place] [address]  Comprehensive  metabolic panel     Status: Abnormal   Collection Time: 02/02/20 11:50 AM  Result Value Ref Range   Sodium 133 (L) 135 - 145 mmol/L   Potassium 3.8 3.5 - 5.1 mmol/L   Chloride 99 98 - 111 mmol/L   CO2 21 (L) 22 - 32 mmol/L   Glucose, Bld 112 (H) 70 - 99 mg/dL    Comment: Glucose reference range applies only to samples taken after fasting for at least 8 hours.   BUN 10 8 - 23 mg/dL   Creatinine, Ser 0.89 0.61 - 1.24 mg/dL   Calcium 9.2 8.9 - 10.3 mg/dL   Total Protein 7.7 6.5 - 8.1 g/dL   Albumin 3.8 3.5 - 5.0 g/dL   AST 176 (H) 15 - 41 U/L   ALT 152 (H) 0 - 44 U/L   Alkaline Phosphatase 313 (H) 38 - 126 U/L   Total Bilirubin 9.7 (H) 0.3 - 1.2 mg/dL   GFR calc non Af Amer >60 >60 mL/min   GFR calc Af Amer >60 >60 mL/min   Anion gap 13 5 - 15    Comment: Performed at [facility] [address], [place] [address]  CBC     Status: Abnormal   Collection Time: 02/02/20 11:50 AM  Result Value Ref Range   WBC 14.5 (H) 4.0 - 10.5 K/uL   RBC 6.25 (H) 4.22 -  5.81 MIL/uL   Hemoglobin 15.2 13.0 - 17.0 g/dL   HCT 45.7 39.0 - 52.0 %   MCV 73.1 (L) 80.0 - 100.0 fL   MCH 24.3 (L) 26.0 - 34.0 pg   MCHC 33.3 30.0 - 36.0 g/dL   RDW 14.7 11.5 - 15.5 %   Platelets 222 150 - 400 K/uL   nRBC 0.0 0.0 - 0.2 %    Comment: Performed at [facility] [address] [place], [place] [address]  Blood culture (routine x 2)     Status: None (Preliminary result)   Collection Time: 02/02/20  4:22 PM   Specimen: BLOOD  Result Value Ref Range   Specimen Description BLOOD LEFT ANTECUBITAL    Special Requests      BOTTLES DRAWN AEROBIC AND ANAEROBIC Blood Culture adequate volume Performed at [facility] [address] [place], [place] [address]    Culture NO GROWTH < 24 HOURS    Report Status PENDING   Blood culture (routine x 2)     Status: None (Preliminary result)   Collection Time: 02/02/20  4:22 PM   Specimen: BLOOD  Result Value Ref Range   Specimen Description  BLOOD RIGHT ANTECUBITAL    Special Requests      BOTTLES DRAWN AEROBIC AND ANAEROBIC Blood Culture results may not be optimal due to an excessive volume of blood received in culture bottles Performed at [facility] [address], [place], [place] [address]    Culture NO GROWTH < 24 HOURS    Report Status PENDING   Troponin I (High Sensitivity)     Status: None   Collection Time: 02/02/20  5:30 PM  Result Value Ref Range   Troponin I (High Sensitivity) 4 <18 ng/L    Comment: (NOTE) Elevated high sensitivity troponin I (hsTnI) values and significant  changes across serial measurements may suggest ACS but many other  chronic and acute conditions are known to elevate hsTnI results.  Refer to the "Links" section for chest pain algorithms and additional  guidance. Performed at [facility], [place] [address] [place], [place] [address]   Respiratory Panel by RT PCR (Flu A&B, Covid) - Nasopharyngeal Swab     Status: None   Collection Time: 02/02/20  6:22 PM   Specimen: Nasopharyngeal Swab  Result Value Ref Range   SARS Coronavirus 2 by RT PCR NEGATIVE NEGATIVE    Comment: (NOTE) SARS-CoV-2 target nucleic acids are NOT DETECTED. The SARS-CoV-2 RNA is generally detectable in upper respiratoy specimens during the acute phase of infection. The lowest concentration of SARS-CoV-2 viral copies this assay can detect is 131 copies/mL. A negative result does not preclude SARS-Cov-2 infection and should not be used as the sole basis for treatment or other patient management decisions. A negative result may occur with  improper specimen collection/handling, submission of specimen other than nasopharyngeal swab, presence of viral mutation(s) within the areas targeted by this assay, and inadequate number of viral copies (<131 copies/mL). A negative result must be combined with clinical observations, patient history, and epidemiological information. The expected result is Negative. Fact  Sheet for Patients:  PinkCheek.be Fact Sheet for Healthcare Providers:  GravelBags.it This test is not yet ap proved or cleared by the [place] FDA and  has been authorized for detection and/or diagnosis of SARS-CoV-2 by FDA under an Emergency Use Authorization (EUA). This EUA will remain  in effect (meaning this test can be used) for the duration of  the COVID-19 declaration under Section 564(b)(1) of the Act, 21 U.S.C. section 360bbb-3(b)(1), unless the authorization is terminated or revoked sooner.    Influenza A by PCR NEGATIVE NEGATIVE   Influenza B by PCR NEGATIVE NEGATIVE    Comment: (NOTE) The Xpert Xpress SARS-CoV-2/FLU/RSV assay is intended as an aid in  the diagnosis of influenza from Nasopharyngeal swab specimens and  should not be used as a sole basis for treatment. Nasal washings and  aspirates are unacceptable for Xpert Xpress SARS-CoV-2/FLU/RSV  testing. Fact Sheet for Patients: PinkCheek.be Fact Sheet for Healthcare Providers: GravelBags.it This test is not yet approved or cleared by the [place] FDA and  has been authorized for detection and/or diagnosis of SARS-CoV-2 by  FDA under an Emergency Use Authorization (EUA). This EUA will remain  in effect (meaning this test can be used) for the duration of the  Covid-19 declaration under Section 564(b)(1) of the Act, 21  U.S.C. section 360bbb-3(b)(1), unless the authorization is  terminated or revoked. Performed at [facility], [place] [address], [place], [place] [address]   Urinalysis, Routine w reflex microscopic     Status: Abnormal   Collection Time: 02/02/20  6:30 PM  Result Value Ref Range   Color, Urine AMBER (A) YELLOW    Comment: BIOCHEMICALS MAY BE AFFECTED BY COLOR   APPearance CLEAR CLEAR   Specific Gravity, Urine >1.046 (H) 1.005 - 1.030   pH 6.0 5.0 - 8.0   Glucose, UA  NEGATIVE NEGATIVE mg/dL   Hgb urine dipstick SMALL (A) NEGATIVE   Bilirubin Urine SMALL (A) NEGATIVE   Ketones, ur NEGATIVE NEGATIVE mg/dL   Protein, ur NEGATIVE NEGATIVE mg/dL   Nitrite NEGATIVE NEGATIVE   Leukocytes,Ua NEGATIVE NEGATIVE   RBC / HPF 6-10 0 - 5 RBC/hpf   WBC, UA 0-5 0 - 5 WBC/hpf   Bacteria, UA RARE (A) NONE SEEN    Comment: Performed at [facility], [place] [address], [place], [place] [address]  Troponin I (High Sensitivity)     Status: None   Collection Time: 02/02/20  7:35 PM  Result Value Ref Range   Troponin I (High Sensitivity) 5 <18 ng/L    Comment: (NOTE) Elevated high sensitivity troponin I (hsTnI) values and significant  changes across serial measurements may suggest ACS but many other  chronic and acute conditions are known to elevate hsTnI results.  Refer to the "Links" section for chest pain algorithms and additional  guidance. Performed at [facility], [place] [address] [place], [place] [address]   Hepatitis panel, acute     Status: None   Collection Time: 02/02/20  7:35 PM  Result Value Ref Range   Hepatitis B Surface Ag NON REACTIVE NON REACTIVE   HCV Ab NON REACTIVE NON REACTIVE    Comment: (NOTE) Nonreactive HCV antibody screen is consistent with no HCV infections,  unless recent infection is suspected or other evidence exists to indicate HCV infection.    Hep A IgM NON REACTIVE NON REACTIVE   Hep B C IgM NON REACTIVE NON REACTIVE    Comment: Performed at [facility], [place] [address] [place], [place] [address]  Magnesium     Status: None   Collection Time: 02/02/20  7:35 PM  Result Value Ref Range   Magnesium 1.8 1.7 - 2.4 mg/dL    Comment: Performed at [facility], [place] [address] [place], [place] [address]  Acetaminophen level     Status: Abnormal  Collection Time: 02/02/20  9:43 PM  Result Value Ref Range   Acetaminophen (Tylenol), Serum <10 (L) 10 - 30 ug/mL    Comment: (NOTE) Therapeutic concentrations vary  significantly. A range of 10-30 ug/mL  may be an effective concentration for many patients. However, some  are best treated at concentrations outside of this range. Acetaminophen concentrations >150 ug/mL at 4 hours after ingestion  and >50 ug/mL at 12 hours after ingestion are often associated with  toxic reactions. Performed at [facility], [place] [address] [place], [place] [address]   Protime-INR     Status: None   Collection Time: 02/02/20  9:43 PM  Result Value Ref Range   Prothrombin Time 14.9 11.4 - 15.2 seconds   INR 1.2 0.8 - 1.2    Comment: (NOTE) INR goal varies based on device and disease states. Performed at [facility], [place] [address] [place], [place] [address]   Glucose, capillary     Status: Abnormal   Collection Time: 02/02/20 11:46 PM  Result Value Ref Range   Glucose-Capillary 119 (H) 70 - 99 mg/dL    Comment: Glucose reference range applies only to samples taken after fasting for at least 8 hours.   Comment 1 Notify RN   Basic metabolic panel     Status: Abnormal   Collection Time: 02/03/20  3:25 AM  Result Value Ref Range   Sodium 134 (L) 135 - 145 mmol/L   Potassium 3.7 3.5 - 5.1 mmol/L   Chloride 102 98 - 111 mmol/L   CO2 23 22 - 32 mmol/L   Glucose, Bld 96 70 - 99 mg/dL    Comment: Glucose reference range applies only to samples taken after fasting for at least 8 hours.   BUN 10 8 - 23 mg/dL   Creatinine, Ser 1.03 0.61 - 1.24 mg/dL   Calcium 8.6 (L) 8.9 - 10.3 mg/dL   GFR calc non Af Amer >60 >60 mL/min   GFR calc Af Amer >60 >60 mL/min   Anion gap 9 5 - 15    Comment: Performed at [facility] [address] [place], [place] [address]  Hepatic function panel     Status: Abnormal   Collection Time: 02/03/20  3:25 AM  Result Value Ref Range   Total Protein 6.4 (L) 6.5 - 8.1 g/dL   Albumin 3.0 (L) 3.5 - 5.0 g/dL   AST 64 (H) 15 - 41 U/L   ALT 84 (H) 0 - 44 U/L   Alkaline Phosphatase 213 (H) 38 - 126 U/L   Total Bilirubin 7.6  (H) 0.3 - 1.2 mg/dL   Bilirubin, Direct 4.7 (H) 0.0 - 0.2 mg/dL   Indirect Bilirubin 2.9 (H) 0.3 - 0.9 mg/dL    Comment: Performed at [facility] [address] [place], [place] [address]  CBC WITH DIFFERENTIAL     Status: Abnormal   Collection Time: 02/03/20  3:25 AM  Result Value Ref Range   WBC 16.3 (H) 4.0 - 10.5 K/uL   RBC 5.45 4.22 - 5.81 MIL/uL   Hemoglobin 13.2 13.0 - 17.0 g/dL   HCT 38.7 (L) 39.0 - 52.0 %   MCV 71.0 (L) 80.0 - 100.0 fL   MCH 24.2 (L) 26.0 - 34.0 pg   MCHC 34.1 30.0 - 36.0 g/dL   RDW 14.3 11.5 - 15.5 %   Platelets 165 150 - 400 K/uL   nRBC 0.0 0.0 - 0.2 %   Neutrophils  Relative % 82 %   Neutro Abs 13.5 (H) 1.7 - 7.7 K/uL   Lymphocytes Relative 9 %   Lymphs Abs 1.4 0.7 - 4.0 K/uL   Monocytes Relative 8 %   Monocytes Absolute 1.2 (H) 0.1 - 1.0 K/uL   Eosinophils Relative 0 %   Eosinophils Absolute 0.0 0.0 - 0.5 K/uL   Basophils Relative 0 %   Basophils Absolute 0.1 0.0 - 0.1 K/uL   Immature Granulocytes 1 %   Abs Immature Granulocytes 0.10 (H) 0.00 - 0.07 K/uL    Comment: Performed at [facility] [address], [place], [place] [address]  Protime-INR     Status: Abnormal   Collection Time: 02/03/20  3:25 AM  Result Value Ref Range   Prothrombin Time 15.8 (H) 11.4 - 15.2 seconds   INR 1.3 (H) 0.8 - 1.2    Comment: (NOTE) INR goal varies based on device and disease states. Performed at [facility], [place] [address] [place],  [address]   TSH     Status: Abnormal   Collection Time: 02/03/20  3:25 AM  Result Value Ref Range   TSH 0.069 (L) 0.350 - 4.500 uIU/mL    Comment: Performed by a 3rd Generation assay with a functional sensitivity of <=0.01 uIU/mL. Performed at [facility], [place] [address] [place], [place] [address]   Glucose, capillary     Status: None   Collection Time: 02/03/20  7:38 AM  Result Value Ref Range   Glucose-Capillary 94 70 - 99 mg/dL    Comment: Glucose reference range applies only to  samples taken after fasting for at least 8 hours.  Glucose, capillary     Status: Abnormal   Collection Time: 02/03/20  4:07 PM  Result Value Ref Range   Glucose-Capillary 110 (H) 70 - 99 mg/dL    Comment: Glucose reference range applies only to samples taken after fasting for at least 8 hours.    CT ABDOMEN PELVIS W CONTRAST  Result Date: 02/02/2020 CLINICAL DATA:  Clinical suspicion for abdominal abscess or infection. Possible cholangitis or liver infection or gallbladder disease. Patient reports upper abdominal pain since yesterday with 1 episode of vomiting. EXAM: CT ABDOMEN AND PELVIS WITH CONTRAST TECHNIQUE: Multidetector CT imaging of the abdomen and pelvis was performed using the standard protocol following bolus administration of intravenous contrast. Automatic exposure control utilized. CONTRAST:  115mL OMNIPAQUE IOHEXOL 300 MG/ML  SOLN COMPARISON:  December 18, 2013. Ultrasonography performed earlier on the same date. FINDINGS: Lower chest: Normal heart size. Mild coronary and thoracic aorta atherosclerotic calcification. Minimal subpleural atelectasis. Hepatobiliary: Abnormal 4.3 cm gallbladder dilatation with abnormal 9 mm gallbladder wall thickening and moderate amount of paracholecystic free fluid that extends into the right paracolic gutter and pelvis. Mild intrahepatic biliary duct dilatation. Normal 5 mm caliber of the common bile duct with distal tapering. No calcified gallstone. Upper limits of normal hepatic size with normal smooth hepatic contours. A tiny 3 mm hypoattenuation in the medial left hemiliver, likely a benign cyst or hemangioma based on size criteria in a patient without known malignancy; no further imaging is necessary. Pancreas: Normal pancreatic parenchyma and main pancreatic duct. Possible pancreatic divisum, series 3 images 35 and 36. No evidence for acute or chronic pancreatitis. Spleen: Normal splenic enhancement and contours. Small spleen measuring 4.4 x 7.5 by 7.0  cm. Adrenals/Urinary Tract: Normal bilateral adrenal glands and ureters. Focal wedge-shaped scarring with hypoenhancement in both renal upper poles. A multi loculated  right renal lower pole cyst with multiple septations and course mural calcification measuring 3.9 by 3.3 by 2.7 cm; increased in size from 2.0 by 2.9 by 2.3 cm in 2015. Stomach/Bowel: Nonobstructed small bowel that is mostly decompressed. Mild fluid-distension and mild mucosal thickening of the duodenal bulb and C-loop, likely a focal ileus and secondary inflammatory change from the adjacent gallbladder fossa. Normal appendix, coronal 32. Paucity of stool. No apparent colorectal wall thickening. Vascular/Lymphatic: Aorto bi-iliac calcified atherosclerosis without and abdominal aortic aneurysm. Reproductive: Prostatomegaly with mass effect on the posterior urinary bladder wall. Focal 7 mm fluid distension of the prostatic urethra, likely a small urethrocele. Other: Small left direct inguinal herniation of fat without strangulation or incarceration. Musculoskeletal: Mild skeletal degenerative change. IMPRESSION: Redemonstration of an acute cholecystitis with abnormal gallbladder dilatation, wall thickening, and free fluid in the abdomen and pelvis. Ileus pattern in the proximal duodenum and likely secondary mild duodenitis. Mild intrahepatic biliary duct dilatation. Normal 5 mm caliber of the common bile duct with distal tapering. No calcified gallstones or calcified choledocholithiasis. Given the noncalcified stones or sludge on recent ultrasonography, further evaluation with MRCP or ERCP could be considered. Possible pancreatic divisum. No acute pancreatitis. A Bosniak IIF right renal lower pole cyst increased in size since 2015 measuring up to 3.9 cm. Surveillance ultrasonography recommended in 6-9 months. Thoracoabdominal aortic calcified atherosclerosis. Mild coronary calcification. Prostatomegaly with small urethrocele. Electronically Signed   By:  Revonda Humphrey   On: 02/02/2020 18:33   MR ABDOMEN MRCP WO CONTRAST  Result Date: 02/03/2020 CLINICAL DATA:  Cholelithiasis. EXAM: MRI ABDOMEN WITHOUT CONTRAST  (INCLUDING MRCP) TECHNIQUE: Multiplanar multisequence MR imaging of the abdomen was performed. Heavily T2-weighted images of the biliary and pancreatic ducts were obtained, and three-dimensional MRCP images were rendered by post processing. COMPARISON:  CT 02/02/2020 FINDINGS: Lower chest:  Lung bases are clear. Hepatobiliary: Mild intrahepatic biliary duct dilatation. The common hepatic duct and common bile duct are normal caliber. There is a gap between the extrahepatic bile ducts and the mid common hepatic duct measuring 8 mm as seen on MRCP image 08/1029. Also appreciated on image 37/26. There is fullness in the neck of the gallbladder (image 9/21). No filling defect within the common bile duct to suggest choledocholithiasis. Common bile duct measures upper limits of normal at 6 mm. There is marked gallbladder wall thickening and pericholecystic fluid. Gallbladder wall measures up to 8 mm. No clear stones within the gallbladder. Extensive inflammation fluid surrounding the gallbladder fossa. Fluid extends into Morrison's pouch and along the RIGHT pericolic gutter. Pancreas: Normal pancreatic parenchymal intensity. No ductal dilatation or inflammation. No variant ductal anatomy. Spleen: Normal spleen. Adrenals/urinary tract: Adrenal glands normal. Multilobulated cystic complex in the lower pole RIGHT kidney measures 3.6 cm. Intervening cyst walls are thin. No complexity. Stomach/Bowel: Stomach and limited of the small bowel is unremarkable Vascular/Lymphatic: Abdominal aortic normal caliber. No retroperitoneal periportal lymphadenopathy. Musculoskeletal: No aggressive osseous lesion IMPRESSION: 1. No choledocholithiasis. 2. Severe inflammation surrounding gallbladder fossa with gallbladder wall thickening suggests acute cholecystitis. 3. Interruption of  the common hepatic duct at the level of the extrahepatic bile ducts. This may relate to edema within the neck of the gallbladder affecting the common hepatic duct. Conversely findings could represent cholangitis or cholangiocarcinoma secondarily obstructing the neck of the gallbladder. No gallstones present to explain cholecystitis. Minimal intrahepatic duct dilatation currently. ERCP may be warranted to evaluate the common hepatic duct. 4. Mild intrahepatic duct dilatation. These results will be called to the ordering  clinician or representative by the Radiologist Assistant, and communication documented in the PACS or zVision Dashboard. Electronically Signed   By: Suzy Bouchard M.D.   On: 02/03/2020 13:38   MR 3D Recon At Scanner  Result Date: 02/03/2020 CLINICAL DATA:  Cholelithiasis. EXAM: MRI ABDOMEN WITHOUT CONTRAST  (INCLUDING MRCP) TECHNIQUE: Multiplanar multisequence MR imaging of the abdomen was performed. Heavily T2-weighted images of the biliary and pancreatic ducts were obtained, and three-dimensional MRCP images were rendered by post processing. COMPARISON:  CT 02/02/2020 FINDINGS: Lower chest:  Lung bases are clear. Hepatobiliary: Mild intrahepatic biliary duct dilatation. The common hepatic duct and common bile duct are normal caliber. There is a gap between the extrahepatic bile ducts and the mid common hepatic duct measuring 8 mm as seen on MRCP image 08/1029. Also appreciated on image 37/26. There is fullness in the neck of the gallbladder (image 9/21). No filling defect within the common bile duct to suggest choledocholithiasis. Common bile duct measures upper limits of normal at 6 mm. There is marked gallbladder wall thickening and pericholecystic fluid. Gallbladder wall measures up to 8 mm. No clear stones within the gallbladder. Extensive inflammation fluid surrounding the gallbladder fossa. Fluid extends into Morrison's pouch and along the RIGHT pericolic gutter. Pancreas: Normal  pancreatic parenchymal intensity. No ductal dilatation or inflammation. No variant ductal anatomy. Spleen: Normal spleen. Adrenals/urinary tract: Adrenal glands normal. Multilobulated cystic complex in the lower pole RIGHT kidney measures 3.6 cm. Intervening cyst walls are thin. No complexity. Stomach/Bowel: Stomach and limited of the small bowel is unremarkable Vascular/Lymphatic: Abdominal aortic normal caliber. No retroperitoneal periportal lymphadenopathy. Musculoskeletal: No aggressive osseous lesion IMPRESSION: 1. No choledocholithiasis. 2. Severe inflammation surrounding gallbladder fossa with gallbladder wall thickening suggests acute cholecystitis. 3. Interruption of the common hepatic duct at the level of the extrahepatic bile ducts. This may relate to edema within the neck of the gallbladder affecting the common hepatic duct. Conversely findings could represent cholangitis or cholangiocarcinoma secondarily obstructing the neck of the gallbladder. No gallstones present to explain cholecystitis. Minimal intrahepatic duct dilatation currently. ERCP may be warranted to evaluate the common hepatic duct. 4. Mild intrahepatic duct dilatation. These results will be called to the ordering clinician or representative by the Radiologist Assistant, and communication documented in the PACS or zVision Dashboard. Electronically Signed   By: Suzy Bouchard M.D.   On: 02/03/2020 13:38   US Abdomen Limited  Result Date: 02/02/2020 CLINICAL DATA:  Right upper quadrant pain EXAM: ULTRASOUND ABDOMEN LIMITED RIGHT UPPER QUADRANT COMPARISON:  None. FINDINGS: Gallbladder: Sludge and probable small calculi. Wall thickening measuring 14 mm. There is small volume pericholecystic fluid. Sonographic Percell Miller sign could not be evaluated due to presence of pain medication. Common bile duct: Diameter: 3.1 mm, normal. Liver: No focal lesion identified. Within normal limits in parenchymal echogenicity. Portal vein is patent on color  Doppler imaging with normal direction of blood flow towards the liver. Other: None. IMPRESSION: Sonographic evidence of acute cholecystitis. These results were called by telephone at the time of interpretation on 02/02/2020 at 4:27 pm to provider MATTHEW TRIFAN , who verbally acknowledged these results. Electronically Signed   By: Macy Mis M.D.   On: 02/02/2020 16:29   DG Chest Portable 1 View  Result Date: 02/02/2020 CLINICAL DATA:  74 year old male with fever and tachycardia. EXAM: PORTABLE CHEST 1 VIEW COMPARISON:  Chest radiograph dated 06/25/2018. FINDINGS: No focal consolidation, pleural effusion, pneumothorax. The cardiac silhouette is within limits. Atherosclerotic calcification of the aorta. No acute osseous pathology.  IMPRESSION: No acute cardiopulmonary process. Electronically Signed   By: Anner Crete M.D.   On: 02/02/2020 19:30    Review of Systems negative except above Blood pressure (!) 141/63, pulse 72, temperature (!) 101.2 F (38.4 C), temperature source Oral, resp. rate 18, height 5\' 7"  (1.702 m), weight 55.8 kg, SpO2 99 %. 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 and I still feel that that might be the best option however if surgery is not willing to proceed with laparoscopic cholecystectomy or if cancer was seen possibly a biopsy and no further intervention I am happy to proceed with an ERCP unfortunately based on the schedule I cannot do that until Thursday Plan: The risks benefits methods of ERCP were discussed with the patient and his wife including possible tissue sampling and stenting and the reasons for proceeding and if surgery does not change their mind we will proceed Thursday with further work-up and  plans pending those findings  Gurinder Toral E 02/03/2020, 6:33 PM

## 2020-02-03 NOTE — Plan of Care (Signed)

## 2020-02-03 NOTE — Progress Notes (Signed)
General Surgery Follow Up Note  Subjective:    Overnight Issues: admitted o/n  Objective:  Vital signs for last 24 hours: Temp:  [98.6 F (37 C)-100.9 F (38.3 C)] 100.9 F (38.3 C) (03/02 1319) Pulse Rate:  [56-77] 56 (03/02 1319) Resp:  [17-18] 18 (03/02 1319) BP: (111-144)/(65-77) 131/77 (03/02 1319) SpO2:  [95 %-100 %] 96 % (03/02 1319) Weight:  [55.8 kg] 55.8 kg (03/01 2102)  Hemodynamic parameters for last 24 hours:    Intake/Output from previous day: 03/01 0701 - 03/02 0700 In: 834 [I.V.:776.2; IV Piggyback:57.8] Out: -   Intake/Output this shift: Total I/O In: 0  Out: 550 [Urine:550]  Vent settings for last 24 hours:    Physical Exam:  Gen: comfortable, no distress Neuro: non-focal exam HEENT: PERRL Neck: supple CV: RRR Pulm: unlabored breathing Abd: soft, RUQ TTP, mildly distended GU: clear yellow urine Extr: wwp, no edema   Results for orders placed or performed during the hospital encounter of 02/02/20 (from the past 24 hour(s))  Blood culture (routine x 2)     Status: None (Preliminary result)   Collection Time: 02/02/20  4:22 PM   Specimen: BLOOD  Result Value Ref Range   Specimen Description BLOOD LEFT ANTECUBITAL    Special Requests      BOTTLES DRAWN AEROBIC AND ANAEROBIC Blood Culture adequate volume   Culture      NO GROWTH < 24 HOURS Performed at [facility], [address] [place], [place] [address]    Report Status PENDING   Blood culture (routine x 2)     Status: None (Preliminary result)   Collection Time: 02/02/20  4:22 PM   Specimen: BLOOD  Result Value Ref Range   Specimen Description BLOOD RIGHT ANTECUBITAL    Special Requests      BOTTLES DRAWN AEROBIC AND ANAEROBIC Blood Culture results may not be optimal due to an excessive volume of blood received in culture bottles   Culture      NO GROWTH < 24 HOURS Performed at [facility] [address], [place], [place] [address]    Report Status PENDING     Troponin I (High Sensitivity)     Status: None   Collection Time: 02/02/20  5:30 PM  Result Value Ref Range   Troponin I (High Sensitivity) 4 <18 ng/L  Respiratory Panel by RT PCR (Flu A&B, Covid) - Nasopharyngeal Swab     Status: None   Collection Time: 02/02/20  6:22 PM   Specimen: Nasopharyngeal Swab  Result Value Ref Range   SARS Coronavirus 2 by RT PCR NEGATIVE NEGATIVE   Influenza A by PCR NEGATIVE NEGATIVE   Influenza B by PCR NEGATIVE NEGATIVE  Urinalysis, Routine w reflex microscopic     Status: Abnormal   Collection Time: 02/02/20  6:30 PM  Result Value Ref Range   Color, Urine AMBER (A) YELLOW   APPearance CLEAR CLEAR   Specific Gravity, Urine >1.046 (H) 1.005 - 1.030   pH 6.0 5.0 - 8.0   Glucose, UA NEGATIVE NEGATIVE mg/dL   Hgb urine dipstick SMALL (A) NEGATIVE   Bilirubin Urine SMALL (A) NEGATIVE   Ketones, ur NEGATIVE NEGATIVE mg/dL   Protein, ur NEGATIVE NEGATIVE mg/dL   Nitrite NEGATIVE NEGATIVE   Leukocytes,Ua NEGATIVE NEGATIVE   RBC / HPF 6-10 0 - 5 RBC/hpf   WBC, UA 0-5 0 - 5 WBC/hpf   Bacteria, UA RARE (A) NONE SEEN  Troponin I (High Sensitivity)  Status: None   Collection Time: 02/02/20  7:35 PM  Result Value Ref Range   Troponin I (High Sensitivity) 5 <18 ng/L  Hepatitis panel, acute     Status: None   Collection Time: 02/02/20  7:35 PM  Result Value Ref Range   Hepatitis B Surface Ag NON REACTIVE NON REACTIVE   HCV Ab NON REACTIVE NON REACTIVE   Hep A IgM NON REACTIVE NON REACTIVE   Hep B C IgM NON REACTIVE NON REACTIVE  Magnesium     Status: None   Collection Time: 02/02/20  7:35 PM  Result Value Ref Range   Magnesium 1.8 1.7 - 2.4 mg/dL  Acetaminophen level     Status: Abnormal   Collection Time: 02/02/20  9:43 PM  Result Value Ref Range   Acetaminophen (Tylenol), Serum <10 (L) 10 - 30 ug/mL  Protime-INR     Status: None   Collection Time: 02/02/20  9:43 PM  Result Value Ref Range   Prothrombin Time 14.9 11.4 - 15.2 seconds   INR 1.2  0.8 - 1.2  Glucose, capillary     Status: Abnormal   Collection Time: 02/02/20 11:46 PM  Result Value Ref Range   Glucose-Capillary 119 (H) 70 - 99 mg/dL   Comment 1 Notify RN   Basic metabolic panel     Status: Abnormal   Collection Time: 02/03/20  3:25 AM  Result Value Ref Range   Sodium 134 (L) 135 - 145 mmol/L   Potassium 3.7 3.5 - 5.1 mmol/L   Chloride 102 98 - 111 mmol/L   CO2 23 22 - 32 mmol/L   Glucose, Bld 96 70 - 99 mg/dL   BUN 10 8 - 23 mg/dL   Creatinine, Ser 1.03 0.61 - 1.24 mg/dL   Calcium 8.6 (L) 8.9 - 10.3 mg/dL   GFR calc non Af Amer >60 >60 mL/min   GFR calc Af Amer >60 >60 mL/min   Anion gap 9 5 - 15  Hepatic function panel     Status: Abnormal   Collection Time: 02/03/20  3:25 AM  Result Value Ref Range   Total Protein 6.4 (L) 6.5 - 8.1 g/dL   Albumin 3.0 (L) 3.5 - 5.0 g/dL   AST 64 (H) 15 - 41 U/L   ALT 84 (H) 0 - 44 U/L   Alkaline Phosphatase 213 (H) 38 - 126 U/L   Total Bilirubin 7.6 (H) 0.3 - 1.2 mg/dL   Bilirubin, Direct 4.7 (H) 0.0 - 0.2 mg/dL   Indirect Bilirubin 2.9 (H) 0.3 - 0.9 mg/dL  CBC WITH DIFFERENTIAL     Status: Abnormal   Collection Time: 02/03/20  3:25 AM  Result Value Ref Range   WBC 16.3 (H) 4.0 - 10.5 K/uL   RBC 5.45 4.22 - 5.81 MIL/uL   Hemoglobin 13.2 13.0 - 17.0 g/dL   HCT 38.7 (L) 39.0 - 52.0 %   MCV 71.0 (L) 80.0 - 100.0 fL   MCH 24.2 (L) 26.0 - 34.0 pg   MCHC 34.1 30.0 - 36.0 g/dL   RDW 14.3 11.5 - 15.5 %   Platelets 165 150 - 400 K/uL   nRBC 0.0 0.0 - 0.2 %   Neutrophils Relative % 82 %   Neutro Abs 13.5 (H) 1.7 - 7.7 K/uL   Lymphocytes Relative 9 %   Lymphs Abs 1.4 0.7 - 4.0 K/uL   Monocytes Relative 8 %   Monocytes Absolute 1.2 (H) 0.1 - 1.0 K/uL   Eosinophils Relative 0 %  Eosinophils Absolute 0.0 0.0 - 0.5 K/uL   Basophils Relative 0 %   Basophils Absolute 0.1 0.0 - 0.1 K/uL   Immature Granulocytes 1 %   Abs Immature Granulocytes 0.10 (H) 0.00 - 0.07 K/uL  Protime-INR     Status: Abnormal   Collection Time:  02/03/20  3:25 AM  Result Value Ref Range   Prothrombin Time 15.8 (H) 11.4 - 15.2 seconds   INR 1.3 (H) 0.8 - 1.2  TSH     Status: Abnormal   Collection Time: 02/03/20  3:25 AM  Result Value Ref Range   TSH 0.069 (L) 0.350 - 4.500 uIU/mL  Glucose, capillary     Status: None   Collection Time: 02/03/20  7:38 AM  Result Value Ref Range   Glucose-Capillary 94 70 - 99 mg/dL    Assessment & Plan:  Present on Admission: . Abdominal pain . Elevated LFTs . Hypertension    LOS: 1 day   Additional comments:I reviewed the patient's new clinical lab test results.   and I reviewed the patients new imaging test results.    HTN Hypothyroid PTSD Bipolar  Transaminitis, hyperbilirubinemia - appears to have some element of acalculous cholecystitis, possible development of cholangitis. MRCP with extrahepatic CHD interruption, possibly external compression from gallbladder vs malignancy (cholangiocarcinoma). In the setting of significantly elevated Tbili to 9.7 on admission, my suspicion for malignancy is high. Ca 19-9, CEA, and AFP sent today for tumor markers. Will try to defer surgery at this time in favor of completing malignancy workup. In the interim would recommend urgent biliary decompression. Discussed this patient with GI and IR. Will attempt ERCP decompression first and if unsuccessful, attempt PTC drainage. Continue zosyn. If malignancy workup is negative, will plan for lap vs open chole. Low threshold for escalation of care. If clinical decline, would recommend emergent biliary decompression by GI/IR. Lengthy discussion held with the patient's wife this afternoon detailing my concerns and possible etiologies, including malignancy. I answered all of her questions. FEN - continue NPO, MIVF DVT - SCDs, recommend DVT ppx with lovenox 40 daily Dispo - floor, low threshold for escalation of care to step-down/ICU   Jesusita Oka, MD Trauma & General Surgery Please use [email or web address] to contact  on call provider  02/03/2020  *Care during the described time interval was provided by me. I have reviewed this patient's available data, including medical history, events of note, physical examination and test results as part of my evaluation.

## 2020-02-04 LAB — COMPREHENSIVE METABOLIC PANEL
ALT: 52 U/L — ABNORMAL HIGH (ref 0–44)
AST: 27 U/L (ref 15–41)
Albumin: 2.7 g/dL — ABNORMAL LOW (ref 3.5–5.0)
Alkaline Phosphatase: 180 U/L — ABNORMAL HIGH (ref 38–126)
Anion gap: 7 (ref 5–15)
BUN: 6 mg/dL — ABNORMAL LOW (ref 8–23)
CO2: 23 mmol/L (ref 22–32)
Calcium: 8.3 mg/dL — ABNORMAL LOW (ref 8.9–10.3)
Chloride: 103 mmol/L (ref 98–111)
Creatinine, Ser: 0.84 mg/dL (ref 0.61–1.24)
GFR calc Af Amer: >60 mL/min (ref >60)
GFR calc non Af Amer: >60 mL/min (ref >60)
Glucose, Bld: 108 mg/dL — ABNORMAL HIGH (ref 70–99)
Potassium: 3.4 mmol/L — ABNORMAL LOW (ref 3.5–5.1)
Sodium: 133 mmol/L — ABNORMAL LOW (ref 135–145)
Total Bilirubin: 3.7 mg/dL — ABNORMAL HIGH (ref 0.3–1.2)
Total Protein: 6.2 g/dL — ABNORMAL LOW (ref 6.5–8.1)

## 2020-02-04 LAB — TYPE AND SCREEN
ABO/RH(D): O POS
Antibody Screen: NEGATIVE

## 2020-02-04 LAB — CBC WITH DIFFERENTIAL/PLATELET
Abs Immature Granulocytes: 0.08 10*3/uL — ABNORMAL HIGH (ref 0.00–0.07)
Basophils Absolute: 0 10*3/uL (ref 0.0–0.1)
Basophils Relative: 0 %
Eosinophils Absolute: 0.1 10*3/uL (ref 0.0–0.5)
Eosinophils Relative: 1 %
HCT: 36.3 % — ABNORMAL LOW (ref 39.0–52.0)
Hemoglobin: 12.4 g/dL — ABNORMAL LOW (ref 13.0–17.0)
Immature Granulocytes: 1 %
Lymphocytes Relative: 7 %
Lymphs Abs: 1 10*3/uL (ref 0.7–4.0)
MCH: 24.5 pg — ABNORMAL LOW (ref 26.0–34.0)
MCHC: 34.2 g/dL (ref 30.0–36.0)
MCV: 71.7 fL — ABNORMAL LOW (ref 80.0–100.0)
Monocytes Absolute: 1 10*3/uL (ref 0.1–1.0)
Monocytes Relative: 7 %
Neutro Abs: 11.9 10*3/uL — ABNORMAL HIGH (ref 1.7–7.7)
Neutrophils Relative %: 84 %
Platelets: 136 10*3/uL — ABNORMAL LOW (ref 150–400)
RBC: 5.06 MIL/uL (ref 4.22–5.81)
RDW: 14.4 % (ref 11.5–15.5)
WBC: 14.1 10*3/uL — ABNORMAL HIGH (ref 4.0–10.5)
nRBC: 0 % (ref 0.0–0.2)

## 2020-02-04 LAB — PROTIME-INR
INR: 1.2 (ref 0.8–1.2)
Prothrombin Time: 14.6 seconds (ref 11.4–15.2)

## 2020-02-04 LAB — GAMMA GT: GGT: 199 U/L — ABNORMAL HIGH (ref 7–50)

## 2020-02-04 LAB — NOVEL CORONAVIRUS, NAA (HOSP ORDER, SEND-OUT TO REF LAB; TAT 18-24 HRS): SARS-CoV-2, NAA: NOT DETECTED

## 2020-02-04 LAB — ABO/RH: ABO/RH(D): O POS

## 2020-02-04 LAB — CEA: CEA: 11.3 ng/mL — ABNORMAL HIGH (ref 0.0–4.7)

## 2020-02-04 LAB — GLUCOSE, CAPILLARY
Glucose-Capillary: 125 mg/dL — ABNORMAL HIGH (ref 70–99)
Glucose-Capillary: 77 mg/dL (ref 70–99)
Glucose-Capillary: 85 mg/dL (ref 70–99)
Glucose-Capillary: 96 mg/dL (ref 70–99)

## 2020-02-04 LAB — CANCER ANTIGEN 19-9: CA 19-9: 46 U/mL — ABNORMAL HIGH (ref 0–35)

## 2020-02-04 LAB — LIPASE, BLOOD: Lipase: 18 U/L (ref 11–51)

## 2020-02-04 LAB — AFP TUMOR MARKER: AFP, Serum, Tumor Marker: 1.7 ng/mL (ref 0.0–8.3)

## 2020-02-04 LAB — LACTATE DEHYDROGENASE: LDH: 119 U/L (ref 98–192)

## 2020-02-04 MED ORDER — SODIUM CHLORIDE 0.9 % IV SOLN: INTRAVENOUS | Status: DC

## 2020-02-04 NOTE — Progress Notes (Signed)
PROGRESS NOTE  Rodney Romero [id number] DOB: Aug 19, 1946 DOA: 02/02/2020 PCP: [facility]  HPI/Recap of past 24 hours: [name] is a 74 y.o. male with history of hypertension hypothyroidism bipolar disorder, alcohol abuse presents to the ER because of abdominal pain.  Patient states he has been abdominal pain mostly in the right upper quadrant for the last 2 days.  Has some subjective feeling of fever chills and diaphoresis.  Denies chest pain shortness of breath.  Denies any vomiting or diarrhea. In the ED, RUQ showed features concerning for cholecystitis. Labs showed AST 176 ALT 152 creatinine 1.8 alkaline phosphatase 313 total bilirubin 9.7.  CBC shows WBC of 14.5 high sensitive troponin was 5 and 4 EKG shows normal sinus rhythm.  General surgery, GI consulted.  Patient admitted for further management.    Today, patient reports persistent right upper quadrant abdominal pain, denies any nausea/vomiting, fever/chills, chest pain, shortness of breath, diarrhea.    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 Continue IV Unasyn Monitor closely  Hyperbilirubinemia T bili currently trending down Continue to trend  Hypertension Continue IV hydralazine as needed  Hypothyroidism Continue IV Synthroid  Alcohol abuse Counseled against abuse CIWA protocol        Malnutrition Type:       Malnutrition Characteristics:      Nutrition Interventions:       Estimated body mass index is 19.27 kg/m as calculated from the following:   Height as of this encounter: 5\' 7"  (1.702 m).   Weight as of this encounter: 55.8 kg.     Code Status: Full  Family Communication: Plan to discuss with family  Disposition Plan: Likely after work-up, ERCP with further evaluation   Consultants:  GI  General surgery  Procedures:  None  Antimicrobials:  Unasyn  DVT prophylaxis: SCDs   Objective: Vitals:   02/04/20 0438 02/04/20 0559 02/04/20 1359 02/04/20 1542  BP: 112/64  114/70   Pulse: 75  65   Resp: 20  14   Temp: 100 F (37.8 C) 98.8 F (37.1 C) 99.6 F (37.6 C) (!) 100.8 F (38.2 C)  TempSrc: Oral Oral Oral Oral  SpO2: 95%  97%   Weight:      Height:        Intake/Output Summary (Last 24 hours) at 02/04/2020 1857 Last data filed at 02/04/2020 1700 Gross per 24 hour  Intake 200 ml  Output 1150 ml  Net -950 ml   Filed Weights   02/02/20 1146 02/02/20 2102  Weight: 61.7 kg 55.8 kg    Exam:  General: NAD   Cardiovascular: S1, S2 present  Respiratory: CTAB  Abdomen: Soft, RUQ tender, nondistended, bowel sounds present  Musculoskeletal: No bilateral pedal edema noted  Skin: Normal  Psychiatry: Normal mood   Data Reviewed: CBC: Recent Labs  Lab 02/02/20 1150 02/03/20 0325 02/04/20 0208  WBC 14.5* 16.3* 14.1*  NEUTROABS  --  13.5* 11.9*  HGB 15.2 13.2 12.4*  HCT 45.7 38.7* 36.3*  MCV  73.1* 71.0* 71.7*  PLT 222 165 [id number]*   Basic Metabolic Panel: Recent Labs  Lab 02/02/20 1150 02/02/20 1935 02/03/20 0325 02/04/20 0208  NA 133*  --  134* 133*  K 3.8  --  3.7 3.4*  CL 99  --  102 103  CO2 21*  --  23 23  GLUCOSE 112*  --  96 108*  BUN 10  --  10 6*  CREATININE 0.89  --  1.03 0.84  CALCIUM 9.2  --  8.6* 8.3*  MG  --  1.8  --   --    GFR: Estimated Creatinine Clearance: 62.7 mL/min (by C-G formula based on SCr of 0.84  mg/dL). Liver Function Tests: Recent Labs  Lab 02/02/20 1150 02/03/20 0325 02/04/20 0208  AST 176* 64* 27  ALT 152* 84* 52*  ALKPHOS 313* 213* 180*  BILITOT 9.7* 7.6* 3.7*  PROT 7.7 6.4* 6.2*  ALBUMIN 3.8 3.0* 2.7*   Recent Labs  Lab 02/02/20 1150 02/04/20 0208  LIPASE 21 18   No results for input(s): AMMONIA in the last 168 hours. Coagulation Profile: Recent Labs  Lab 02/02/20 2143 02/03/20 0325 02/04/20 0808  INR 1.2 1.3* 1.2   Cardiac Enzymes: No results for input(s): CKTOTAL, CKMB, CKMBINDEX, TROPONINI in the last 168 hours. BNP (last 3 results) No results for input(s): PROBNP in the last 8760 hours. HbA1C: No results for input(s): HGBA1C in the last 72 hours. CBG: Recent Labs  Lab 02/03/20 0738 02/03/20 1607 02/03/20 2359 02/04/20 0945 02/04/20 1623  GLUCAP 94 110* 125* 85 77   Lipid Profile: No results for input(s): CHOL, HDL, LDLCALC, TRIG, CHOLHDL, LDLDIRECT in the last 72 hours. Thyroid Function Tests: Recent Labs    02/03/20 0325  TSH 0.069*   Anemia Panel: No results for input(s): VITAMINB12, FOLATE, FERRITIN, TIBC, IRON, RETICCTPCT in the last 72 hours. Urine analysis:    Component Value Date/Time   COLORURINE AMBER (A) 02/02/2020 1830   APPEARANCEUR CLEAR 02/02/2020 1830   LABSPEC >1.046 (H) 02/02/2020 1830   PHURINE 6.0 02/02/2020 1830   GLUCOSEU NEGATIVE 02/02/2020 1830   HGBUR SMALL (A) 02/02/2020 1830   BILIRUBINUR SMALL (A) 02/02/2020 1830   KETONESUR NEGATIVE 02/02/2020 1830   PROTEINUR NEGATIVE 02/02/2020 1830   UROBILINOGEN 1.0 09/04/2015 2309   NITRITE NEGATIVE 02/02/2020 1830   LEUKOCYTESUR NEGATIVE 02/02/2020 1830   Sepsis Labs: @LABRCNTIP (procalcitonin:4,lacticidven:4)  ) Recent Results (from the past 240 hour(s))  Novel Coronavirus, NAA (Hosp order, Send-out to Ref Lab; TAT 18-24 hrs     Status: None   Collection Time: 02/02/20 11:04 AM   Specimen: Nasopharyngeal Swab; Respiratory  Result Value Ref Range Status     SARS-CoV-2, NAA NOT DETECTED NOT DETECTED Final    Comment: (NOTE) This nucleic acid amplification test was developed and its performance characteristics determined by [employer]. Nucleic acid amplification tests include RT-PCR and TMA. This test has not been FDA cleared or approved. This test has been authorized by FDA under an Emergency Use Authorization (EUA). This test is only authorized for the duration of time the declaration that circumstances exist justifying the authorization of the emergency use of in vitro diagnostic tests for detection of SARS-CoV-2 virus and/or diagnosis of COVID-19 infection under section 564(b)(1) of the Act, 21 U.S.C. [id number]) (1), unless the authorization is terminated or revoked sooner. When diagnostic testing is negative, the possibility of a false negative result should be considered in the context of a patient's recent exposures and the presence of clinical  signs and symptoms consistent with COVID-19. An individual without symptoms of COVID- 19 and who is not shedding SARS-CoV-2  virus would expect to have a negative (not detected) result in this assay. Performed At: [facility] [address] [place], [place] [id number] Rush Farmer MD [id number]    [place]  Final    Comment: Performed at [facility], [place] [address], [place], [place] [address]  Blood culture (routine x 2)     Status: None (Preliminary result)   Collection Time: 02/02/20  4:22 PM   Specimen: BLOOD  Result Value Ref Range Status   Specimen Description BLOOD LEFT ANTECUBITAL  Final   Special Requests   Final    BOTTLES DRAWN AEROBIC AND ANAEROBIC Blood Culture adequate volume   Culture   Final    NO GROWTH 2 DAYS Performed at [facility], [place] [address], [place], [place] [address]    Report Status PENDING  Incomplete  Blood culture (routine x 2)     Status: None (Preliminary result)   Collection Time:  02/02/20  4:22 PM   Specimen: BLOOD  Result Value Ref Range Status   Specimen Description BLOOD RIGHT ANTECUBITAL  Final   Special Requests   Final    BOTTLES DRAWN AEROBIC AND ANAEROBIC Blood Culture results may not be optimal due to an excessive volume of blood received in culture bottles   Culture   Final    NO GROWTH 2 DAYS Performed at [facility], [place] [address], [place], [place] [address]    Report Status PENDING  Incomplete  Respiratory Panel by RT PCR (Flu A&B, Covid) - Nasopharyngeal Swab     Status: None   Collection Time: 02/02/20  6:22 PM   Specimen: Nasopharyngeal Swab  Result Value Ref Range Status   SARS Coronavirus 2 by RT PCR NEGATIVE NEGATIVE Final    Comment: (NOTE) SARS-CoV-2 target nucleic acids are NOT DETECTED. The SARS-CoV-2 RNA is generally detectable in upper respiratoy specimens during the acute phase of infection. The lowest concentration of SARS-CoV-2 viral copies this assay can detect is 131 copies/mL. A negative result does not preclude SARS-Cov-2 infection and should not be used as the sole basis for treatment or other patient management decisions. A negative result may occur with  improper specimen collection/handling, submission of specimen other than nasopharyngeal swab, presence of viral mutation(s) within the areas targeted by this assay, and inadequate number of viral copies (<131 copies/mL). A negative result must be combined with clinical observations, patient history, and epidemiological information. The expected result is Negative. Fact Sheet for Patients:  PinkCheek.be Fact Sheet for Healthcare Providers:  GravelBags.it This test is not yet ap proved or cleared by the [place] FDA and  has been authorized for detection and/or diagnosis of SARS-CoV-2 by FDA under an Emergency Use Authorization (EUA). This EUA will remain  in effect (meaning this test can be used) for  the duration of the COVID-19 declaration under Section 564(b)(1) of the Act, 21 U.S.C. section 360bbb-3(b)(1), unless the authorization is terminated or revoked sooner.    Influenza A by PCR NEGATIVE NEGATIVE Final   Influenza B by PCR NEGATIVE NEGATIVE Final    Comment: (NOTE) The Xpert Xpress SARS-CoV-2/FLU/RSV assay is intended as an aid in  the diagnosis of influenza from Nasopharyngeal swab specimens and  should not be used as a sole basis for treatment. Nasal washings and  aspirates are unacceptable for Xpert Xpress SARS-CoV-2/FLU/RSV  testing. Fact  Sheet for Patients: PinkCheek.be Fact Sheet for Healthcare Providers: GravelBags.it This test is not yet approved or cleared by the [place] FDA and  has been authorized for detection and/or diagnosis of SARS-CoV-2 by  FDA under an Emergency Use Authorization (EUA). This EUA will remain  in effect (meaning this test can be used) for the duration of the  Covid-19 declaration under Section 564(b)(1) of the Act, 21  U.S.C. section 360bbb-3(b)(1), unless the authorization is  terminated or revoked. Performed at [facility], [place] [address] [place], [place] [address]       Studies: No results found.  Scheduled Meds: . folic acid  1 mg Oral Daily  . levothyroxine  67.5 mcg Intravenous Daily  . multivitamin with minerals  1 tablet Oral Daily  . sodium chloride flush  3 mL Intravenous Once  . thiamine  100 mg Oral Daily   Or  . thiamine  100 mg Intravenous Daily    Continuous Infusions: . ampicillin-sulbactam (UNASYN) IV 1.5 g (02/04/20 1436)     LOS: 2 days     Alma Friendly, MD [facility]  If 7PM-7AM, please contact night-coverage [email or web address] 02/04/2020, 6:57 PM

## 2020-02-04 NOTE — Progress Notes (Signed)
General Surgery Follow Up Note  Subjective:    Overnight Issues: NAEON, feels better today than yesterday  Objective:  Vital signs for last 24 hours: Temp:  [98.4 F (36.9 C)-101.2 F (38.4 C)] 98.8 F (37.1 C) (03/03 0559) Pulse Rate:  [56-75] 75 (03/03 0438) Resp:  [18-20] 20 (03/03 0438) BP: (112-141)/(63-77) 112/64 (03/03 0438) SpO2:  [95 %-99 %] 95 % (03/03 0438)  Hemodynamic parameters for last 24 hours:    Intake/Output from previous day: 03/02 0701 - 03/03 0700 In: 358.2 [IV Piggyback:358.2] Out: 1750 [Urine:1750]  Intake/Output this shift: No intake/output data recorded.  Vent settings for last 24 hours:    Physical Exam:  Gen: comfortable, no distress Neuro: non-focal exam HEENT: PERRL Neck: supple CV: RRR Pulm: unlabored breathing Abd: soft, stable RUQ TTP, mildly distended GU: clear yellow urine Extr: wwp, no edema   Results for orders placed or performed during the hospital encounter of 02/02/20 (from the past 24 hour(s))  CEA     Status: Abnormal   Collection Time: 02/03/20  3:46 PM  Result Value Ref Range   CEA 11.3 (H) 0.0 - 4.7 ng/mL  AFP tumor marker     Status: None   Collection Time: 02/03/20  3:46 PM  Result Value Ref Range   AFP, Serum, Tumor Marker 1.7 0.0 - 8.3 ng/mL  Glucose, capillary     Status: Abnormal   Collection Time: 02/03/20  4:07 PM  Result Value Ref Range   Glucose-Capillary 110 (H) 70 - 99 mg/dL  Glucose, capillary     Status: Abnormal   Collection Time: 02/03/20 11:59 PM  Result Value Ref Range   Glucose-Capillary 125 (H) 70 - 99 mg/dL  CBC with Differential/Platelet     Status: Abnormal   Collection Time: 02/04/20  2:08 AM  Result Value Ref Range   WBC 14.1 (H) 4.0 - 10.5 K/uL   RBC 5.06 4.22 - 5.81 MIL/uL   Hemoglobin 12.4 (L) 13.0 - 17.0 g/dL   HCT 36.3 (L) 39.0 - 52.0 %   MCV 71.7 (L) 80.0 - 100.0 fL   MCH 24.5 (L) 26.0 - 34.0 pg   MCHC 34.2 30.0 - 36.0 g/dL   RDW 14.4 11.5 - 15.5 %   Platelets 136 (L)  150 - 400 K/uL   nRBC 0.0 0.0 - 0.2 %   Neutrophils Relative % 84 %   Neutro Abs 11.9 (H) 1.7 - 7.7 K/uL   Lymphocytes Relative 7 %   Lymphs Abs 1.0 0.7 - 4.0 K/uL   Monocytes Relative 7 %   Monocytes Absolute 1.0 0.1 - 1.0 K/uL   Eosinophils Relative 1 %   Eosinophils Absolute 0.1 0.0 - 0.5 K/uL   Basophils Relative 0 %   Basophils Absolute 0.0 0.0 - 0.1 K/uL   Immature Granulocytes 1 %   Abs Immature Granulocytes 0.08 (H) 0.00 - 0.07 K/uL  Lipase, blood     Status: None   Collection Time: 02/04/20  2:08 AM  Result Value Ref Range   Lipase 18 11 - 51 U/L  Comprehensive metabolic panel     Status: Abnormal   Collection Time: 02/04/20  2:08 AM  Result Value Ref Range   Sodium 133 (L) 135 - 145 mmol/L   Potassium 3.4 (L) 3.5 - 5.1 mmol/L   Chloride 103 98 - 111 mmol/L   CO2 23 22 - 32 mmol/L   Glucose, Bld 108 (H) 70 - 99 mg/dL   BUN 6 (L) 8 -  23 mg/dL   Creatinine, Ser 0.84 0.61 - 1.24 mg/dL   Calcium 8.3 (L) 8.9 - 10.3 mg/dL   Total Protein 6.2 (L) 6.5 - 8.1 g/dL   Albumin 2.7 (L) 3.5 - 5.0 g/dL   AST 27 15 - 41 U/L   ALT 52 (H) 0 - 44 U/L   Alkaline Phosphatase 180 (H) 38 - 126 U/L   Total Bilirubin 3.7 (H) 0.3 - 1.2 mg/dL   GFR calc non Af Amer >60 >60 mL/min   GFR calc Af Amer >60 >60 mL/min   Anion gap 7 5 - 15  Lactate dehydrogenase     Status: None   Collection Time: 02/04/20  2:08 AM  Result Value Ref Range   LDH 119 98 - 192 U/L  Type and screen [facility]     Status: None   Collection Time: 02/04/20  8:07 AM  Result Value Ref Range   ABO/RH(D) O POS    Antibody Screen NEG    Sample Expiration      02/07/2020,2359 Performed at [facility], [place] [address] [place], [place] [address]   Protime-INR     Status: None   Collection Time: 02/04/20  8:08 AM  Result Value Ref Range   Prothrombin Time 14.6 11.4 - 15.2 seconds   INR 1.2 0.8 - 1.2  Gamma GT     Status: Abnormal   Collection Time: 02/04/20  8:08 AM  Result Value Ref Range     GGT 199 (H) 7 - 50 U/L  Glucose, capillary     Status: None   Collection Time: 02/04/20  9:45 AM  Result Value Ref Range   Glucose-Capillary 85 70 - 99 mg/dL    Assessment & Plan:  Present on Admission: . Abdominal pain . Elevated LFTs . Hypertension    LOS: 2 days   Additional comments:I reviewed the patient's new clinical lab test results.   and I reviewed the patients new imaging test results.    HTN Hypothyroid PTSD Bipolar  Transaminitis, hyperbilirubinemia - appears to have at least acalculous cholecystitis, possible development of cholangitis. MRCP with extrahepatic CHD interruption, possibly external compression from gallbladder vs malignancy (cholangiocarcinoma).Despite downtrending of Tbili to 3.7 today, with elevated tumor markers of Ca 19-9 (46) and CEA (11.3) in the setting of recent 5-10 pound weight loss, my suspicion for malignancy remains high. GI has been consulted and plans to perform ERCP 3/4, which will hopefully be able to obtain biopsy/brushings for continued malignancy workup. With a downtrending bilirubin and clinical improvement, I think it is reasonable to wait until 3/4 for ERCP and completion of malignancy workup, as if this is indeed a cholangiocarcinoma, the surgical management based on the location on MRCP would be an en bloc extrahepatic bile duct resection woth regional lymphadenectomy and roux-en-y hepaticojejunostomy. If malignancy can be reasonably ruled out with PET scan and staging laparoscopy if endoscopic brushings and IDUS are negative, would recommend cholecystectomy. Given the amount of inflammation on his initial CT scan, performing this now does put him at high risk for converting to an open cholecystectomy. Recommend continuing abx. If clinical decline, would recommend emergent biliary decompression by GI/IR.  FEN - continue NPO, MIVF DVT - SCDs, recommend DVT ppx with lovenox 40 daily Dispo - floor, low threshold for escalation of care to  step-down/ICU   Jesusita Oka, MD Trauma & General Surgery Please use [email or web address] to contact on call provider  02/04/2020  *Care during the described  time interval was provided by me. I have reviewed this patient's available data, including medical history, events of note, physical examination and test results as part of my evaluation.

## 2020-02-04 NOTE — Progress Notes (Signed)
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

## 2020-02-05 ENCOUNTER — Inpatient Hospital Stay (HOSPITAL_COMMUNITY): Payer: Medicare Other | Admitting: Certified Registered"

## 2020-02-05 ENCOUNTER — Inpatient Hospital Stay (HOSPITAL_COMMUNITY): Payer: Medicare Other

## 2020-02-05 ENCOUNTER — Encounter (HOSPITAL_COMMUNITY)
Admission: EM | Disposition: A | Discharge status: Home / Self Care | Payer: Self-pay | Source: Home / Self Care | Attending: Internal Medicine

## 2020-02-05 ENCOUNTER — Encounter (HOSPITAL_COMMUNITY): Payer: Self-pay | Admitting: Internal Medicine

## 2020-02-05 HISTORY — PX: SPHINCTEROTOMY: SHX5544

## 2020-02-05 HISTORY — PX: ERCP: SHX5425

## 2020-02-05 LAB — CBC WITH DIFFERENTIAL/PLATELET
Abs Immature Granulocytes: 0.04 10*3/uL (ref 0.00–0.07)
Basophils Absolute: 0 10*3/uL (ref 0.0–0.1)
Basophils Relative: 0 %
Eosinophils Absolute: 0.2 10*3/uL (ref 0.0–0.5)
Eosinophils Relative: 1 %
HCT: 37 % — ABNORMAL LOW (ref 39.0–52.0)
Hemoglobin: 12.5 g/dL — ABNORMAL LOW (ref 13.0–17.0)
Immature Granulocytes: 0 %
Lymphocytes Relative: 9 %
Lymphs Abs: 1.1 10*3/uL (ref 0.7–4.0)
MCH: 24.2 pg — ABNORMAL LOW (ref 26.0–34.0)
MCHC: 33.8 g/dL (ref 30.0–36.0)
MCV: 71.6 fL — ABNORMAL LOW (ref 80.0–100.0)
Monocytes Absolute: 0.9 10*3/uL (ref 0.1–1.0)
Monocytes Relative: 8 %
Neutro Abs: 9.7 10*3/uL — ABNORMAL HIGH (ref 1.7–7.7)
Neutrophils Relative %: 82 %
Platelets: 163 10*3/uL (ref 150–400)
RBC: 5.17 MIL/uL (ref 4.22–5.81)
RDW: 14.2 % (ref 11.5–15.5)
WBC: 12 10*3/uL — ABNORMAL HIGH (ref 4.0–10.5)
nRBC: 0 % (ref 0.0–0.2)

## 2020-02-05 LAB — COMPREHENSIVE METABOLIC PANEL
ALT: 38 U/L (ref 0–44)
AST: 19 U/L (ref 15–41)
Albumin: 2.5 g/dL — ABNORMAL LOW (ref 3.5–5.0)
Alkaline Phosphatase: 162 U/L — ABNORMAL HIGH (ref 38–126)
Anion gap: 10 (ref 5–15)
BUN: 7 mg/dL — ABNORMAL LOW (ref 8–23)
CO2: 23 mmol/L (ref 22–32)
Calcium: 8.4 mg/dL — ABNORMAL LOW (ref 8.9–10.3)
Chloride: 103 mmol/L (ref 98–111)
Creatinine, Ser: 0.83 mg/dL (ref 0.61–1.24)
GFR calc Af Amer: >60 mL/min (ref >60)
GFR calc non Af Amer: >60 mL/min (ref >60)
Glucose, Bld: 80 mg/dL (ref 70–99)
Potassium: 3.3 mmol/L — ABNORMAL LOW (ref 3.5–5.1)
Sodium: 136 mmol/L (ref 135–145)
Total Bilirubin: 3 mg/dL — ABNORMAL HIGH (ref 0.3–1.2)
Total Protein: 6.2 g/dL — ABNORMAL LOW (ref 6.5–8.1)

## 2020-02-05 LAB — GLUCOSE, CAPILLARY
Glucose-Capillary: 115 mg/dL — ABNORMAL HIGH (ref 70–99)
Glucose-Capillary: 210 mg/dL — ABNORMAL HIGH (ref 70–99)
Glucose-Capillary: 74 mg/dL (ref 70–99)

## 2020-02-05 LAB — SICKLE CELL SCREEN: Sickle Cell Screen: POSITIVE — AB

## 2020-02-05 SURGERY — ERCP, WITH INTERVENTION IF INDICATED
Anesthesia: General

## 2020-02-05 MED ORDER — FENTANYL CITRATE (PF) 100 MCG/2ML IJ SOLN
INTRAMUSCULAR | Status: DC | PRN
  Administered 2020-02-05 (×2): 50 ug via INTRAVENOUS

## 2020-02-05 MED ORDER — GLYCOPYRROLATE PF 0.2 MG/ML IJ SOSY
PREFILLED_SYRINGE | INTRAMUSCULAR | Status: DC | PRN
  Administered 2020-02-05: .1 mg via INTRAVENOUS

## 2020-02-05 MED ORDER — GLUCAGON HCL RDNA (DIAGNOSTIC) 1 MG IJ SOLR
INTRAMUSCULAR | Status: AC
  Filled 2020-02-05: qty 1

## 2020-02-05 MED ORDER — DEXAMETHASONE SODIUM PHOSPHATE 10 MG/ML IJ SOLN
INTRAMUSCULAR | Status: DC | PRN
  Administered 2020-02-05: 6 mg via INTRAVENOUS

## 2020-02-05 MED ORDER — PROPOFOL 10 MG/ML IV BOLUS
INTRAVENOUS | Status: DC | PRN
  Administered 2020-02-05: 150 mg via INTRAVENOUS

## 2020-02-05 MED ORDER — LACTATED RINGERS IV SOLN: INTRAVENOUS | Status: DC | PRN

## 2020-02-05 MED ORDER — SODIUM CHLORIDE 0.9 % IV SOLN
INTRAVENOUS | Status: DC | PRN
  Administered 2020-02-05: 10 mL

## 2020-02-05 MED ORDER — LIDOCAINE 2% (20 MG/ML) 5 ML SYRINGE
INTRAMUSCULAR | Status: DC | PRN
  Administered 2020-02-05: 100 mg via INTRAVENOUS

## 2020-02-05 MED ORDER — INDOMETHACIN 50 MG RE SUPP
RECTAL | Status: DC | PRN
  Administered 2020-02-05: 100 mg via RECTAL

## 2020-02-05 MED ORDER — ROCURONIUM BROMIDE 50 MG/5ML IV SOSY
PREFILLED_SYRINGE | INTRAVENOUS | Status: DC | PRN
  Administered 2020-02-05: 80 mg via INTRAVENOUS

## 2020-02-05 MED ORDER — SUGAMMADEX SODIUM 200 MG/2ML IV SOLN
INTRAVENOUS | Status: DC | PRN
  Administered 2020-02-05: 120 mg via INTRAVENOUS

## 2020-02-05 MED ORDER — SODIUM CHLORIDE 0.9 % IV SOLN: INTRAVENOUS | Status: DC

## 2020-02-05 MED ORDER — PHENYLEPHRINE HCL-NACL 10-0.9 MG/250ML-% IV SOLN
INTRAVENOUS | Status: DC | PRN
  Administered 2020-02-05: 60 ug/min via INTRAVENOUS

## 2020-02-05 MED ORDER — ATROPINE SULFATE 1 MG/ML IJ SOLN
0.5000 mg | Freq: Once | INTRAMUSCULAR | Status: AC | PRN
  Administered 2020-02-05: 23:00:00 0.5 mg via INTRAVENOUS
  Filled 2020-02-05: qty 0.5

## 2020-02-05 MED ORDER — ATROPINE SULFATE 1 MG/10ML IJ SOSY
0.5000 mg | PREFILLED_SYRINGE | INTRAMUSCULAR | Status: DC | PRN
  Filled 2020-02-05: qty 10

## 2020-02-05 MED ORDER — INDOMETHACIN 50 MG RE SUPP
100.0000 mg | RECTAL | Status: DC
  Filled 2020-02-05: qty 2

## 2020-02-05 MED ORDER — GLUCAGON HCL RDNA (DIAGNOSTIC) 1 MG IJ SOLR
INTRAMUSCULAR | Status: DC | PRN
  Administered 2020-02-05: .5 mg via INTRAVENOUS

## 2020-02-05 MED ORDER — ONDANSETRON HCL 4 MG/2ML IJ SOLN
INTRAMUSCULAR | Status: DC | PRN
  Administered 2020-02-05: 4 mg via INTRAVENOUS

## 2020-02-05 MED ORDER — PHENYLEPHRINE 40 MCG/ML (10ML) SYRINGE FOR IV PUSH (FOR BLOOD PRESSURE SUPPORT)
PREFILLED_SYRINGE | INTRAVENOUS | Status: DC | PRN
  Administered 2020-02-05 (×2): 40 ug via INTRAVENOUS
  Administered 2020-02-05: 80 ug via INTRAVENOUS

## 2020-02-05 NOTE — Plan of Care (Signed)

## 2020-02-05 NOTE — Transfer of Care (Signed)
Immediate Anesthesia Transfer of Care Note  Patient: Rodney Romero  Procedure(s) Performed: ENDOSCOPIC RETROGRADE CHOLANGIOPANCREATOGRAPHY (ERCP) (N/A )  Patient Location: Endoscopy Unit  Anesthesia Type:General  Level of Consciousness: awake and patient cooperative  Airway & Oxygen Therapy: Patient Spontanous Breathing and Patient connected to face mask oxygen  Post-op Assessment: Report given to RN, Post -op Vital signs reviewed and stable and Patient moving all extremities X 4  Post vital signs: Reviewed and stable  Last Vitals:  Vitals Value Taken Time  BP 120/56 02/05/20 1304  Temp    Pulse 73 02/05/20 1305  Resp 14 02/05/20 1305  SpO2 96 % 02/05/20 1305  Vitals shown include unvalidated device data.  Last Pain:  Vitals:   02/05/20 1019  TempSrc: Oral  PainSc: 0-No pain      Patients Stated Pain Goal: 0 ([id number])  Complications: No apparent anesthesia complications

## 2020-02-05 NOTE — Op Note (Signed)
[facility] Patient Name: Rodney Romero Procedure Date : 02/05/2020 MRN: [id number] Attending MD: Clarene Essex , MD Date of Birth: 1946-11-05 CSN: [id number] Age: 74 Admit Type: Inpatient Procedure:                ERCP Indications:              Abnormal MRCP, Elevated liver enzymes Providers:                Clarene Essex, MD, Grace Isaac, RN, William Dalton,                            Technician, Elspeth Cho Tech., Technician Referring MD:              Medicines:                General Anesthesia Complications:            No immediate complications. Estimated Blood Loss:     Estimated blood loss: none. Procedure:                Pre-Anesthesia Assessment:                           - Prior to the procedure, a History and Physical                            was performed, and patient medications and                            allergies were reviewed. The patient's tolerance of                            previous anesthesia was also reviewed. The risks                            and benefits of the procedure and the sedation                            options and risks were discussed with the patient.                            All questions were answered, and informed consent                            was obtained. Prior Anticoagulants: The patient has                            taken no previous anticoagulant or antiplatelet                            agents. ASA Grade Assessment: II - A patient with                            mild systemic disease. After reviewing the risks  and benefits, the patient was deemed in                            satisfactory condition to undergo the procedure.                           After obtaining informed consent, the scope was                            passed under direct vision. Throughout the                            procedure, the patient's blood pressure, pulse, and                            oxygen  saturations were monitored continuously. The                            [id number] Olympus duodenoscope was                            introduced through the mouth, and used to inject                            contrast into and used to cannulate the bile duct.                            The ERCP was technically difficult and complex due                            to challenging cannulation because of abnormal                            anatomy. Successful completion of the procedure was                            aided by performing the maneuvers documented                            (below) in this report. The patient tolerated the                            procedure well. Scope In: Scope Out: Findings:      The major papilla was normal. Unfortunately scope position was very       difficult despite rolling him on his left side as well and there were       some folds that we did try to clip out of the way unsuccessful x3 and       after a prolonged effort with both the regular and the smaller       sphincterotome's in the long position we had adequate landmarks and did       watch the papilla drain some bile so A biliary pre-cut sphincterotomy       was made with a needle knife using  a freehand technique using ERBE       electrocautery. There was no post-sphincterotomy bleeding. We did get       adequate biliary drainage but could not cannulate with the wire through       the needle knife sphincterotome so we switched to the smaller       sphincterotome loaded with the wire but still could not get deep       selective cannulation however periodically we were in good position and       the biliary sphincterotomy was increased with a Hydratome sphincterotome       using ERBE electrocautery. There was no post-sphincterotomy bleeding. We       were still unsuccessful at cannulating so we switched to this regular       sphincterotome with a stiffer wire and again increased the        sphincterotomy site and any obvious complication and there was adequate       biliary drainage from below but we still could not cannulate after a       prolonged effort we elected to stop the procedure at this point and       there was no dye injection and no pancreatic duct wire advancement       throughout the procedure Impression:               - The major papilla appeared normal. Although                            edematous folds surrounding it                           - A needle knife biliary sphincterotomy was                            performed.                           - A conventional biliary sphincterotomy was                            performed. Of note the bile that was draining was                            fairly clear without significant milky cholangitis Recommendation:           - NPO today. Unless surgical team will allow clear                            liquids and continue antibiotics                           - Continue present medications.                           - Telephone GI clinic if symptomatic PRN.                           - Repeat ERCP if surgery team still feels strongly  otherwise consider a retry ERCP either here or at a                            un[facility] and even consider a repeat MRI                            MRCP to better delineate his anatomy reevaluate the                            gallbladder and see if after a few days of                            antibiotics images are more diagnostic. Otherwise                            consider lap chole with Intra-Op cholangiogram Procedure Code(s):        --- Professional ---                           [phone], Endoscopic retrograde                            cholangiopancreatography (ERCP); with                            sphincterotomy/papillotomy Diagnosis Code(s):        --- Professional ---                           R74.8, Abnormal levels of other serum  enzymes                           R93.2, Abnormal findings on diagnostic imaging of                            liver and biliary tract CPT copyright 2019 [employer]. All rights reserved. The codes documented in this report are preliminary and upon coder review may  be revised to meet current compliance requirements. Clarene Essex, MD 02/05/2020 1:13:22 PM This report has been signed electronically. Number of Addenda: 0

## 2020-02-05 NOTE — Progress Notes (Addendum)
Rodney Romero [id number] DOB: [birth year] DOA: 02/02/2020 PCP: [facility]  HPI/Recap of past 24 hours: [name] is a 74 y.o. male with history of hypertension hypothyroidism bipolar disorder, alcohol abuse presents to the ER because of abdominal pain.  Patient states he has been abdominal pain mostly in the right upper quadrant for the last 2 days.  Has some subjective feeling of fever chills and diaphoresis.  Denies chest pain shortness of breath.  Denies any vomiting or diarrhea. In the ED, RUQ showed features concerning for cholecystitis. Labs showed AST 176 ALT 152 creatinine 1.8 alkaline phosphatase 313 total bilirubin 9.7.  CBC shows WBC of 14.5 high sensitive troponin was 5 and 4 EKG shows normal sinus rhythm.  General surgery, GI consulted.  Patient admitted for further management.     Today, saw pt after ERCP, telemetry reported HR in 40s, checked manually at bedside, HR in 50s, EKG showed sinus brady. Asymptomatic. Pt denies any chest pain, shortness of breath, abdominal pain, nausea/vomiting, fever/chills.    Assessment/Plan: Principal Problem:   Abdominal pain Active Problems:   Elevated LFTs   Hypertension   Cholecystitis, acute   Acute cholecystitis versus cholangiocarcinoma versus Mirizzi syndrome Currently afebrile, with resolving leukocytosis BC x2, NGTD CA 19-9 46, CEA 11.3, GGT 199 Right upper quadrant ultrasound with possible acute cholecystitis CT abdomen pelvis showed acute cholecystitis GI on board, s/p ERCP on 02/05/2020, unfortunately scope position was very difficult despite multiple maneuvers General surgery on board, plan for possible cholecystectomy Vs percutaneous cholecystostomy by IR,once cholangiocarcinoma has been ruled out IV fluids, n.p.o. Continue IV Unasyn Monitor closely  Hyperbilirubinemia T bili currently trending down Continue to trend  Sinus bradycardia Telemetry reported HR  in 40s, checked manually at bedside, HR in 50s, EKG showed sinus brady, HR 50s. Asymptomatic. Bedside continuous monitoring As needed atropine if heart rate less than 40 Continue telemetry  Hypertension Continue IV hydralazine as needed  Hypothyroidism Continue IV Synthroid  Alcohol abuse Counseled against abuse CIWA protocol        Malnutrition Type:      Malnutrition Characteristics:      Nutrition Interventions:       Estimated body mass index is 19.27 kg/m as calculated from the following:   Height as of this encounter: 5\' 7"  (1.702 m).   Weight as of this encounter: 55.8 kg.     Code Status: Full  Family Communication: Discussed with wife at bedside  Disposition Plan: Possibly Home likely after work-up   Consultants:  GI  General surgery  Procedures:  ERCP on 02/05/20  Antimicrobials:  Unasyn  DVT prophylaxis: SCDs   Objective: Vitals:   02/05/20 1324 02/05/20 1334 02/05/20 1357 02/05/20 1648  BP: (!) 105/49 (!) 111/49 120/65 128/67  Pulse: (!) 57 (!) 57 (!) 57 (!) 47  Resp: 15 15 16 16   Temp:   98.7 F (37.1 C) 98.3 F (36.8 C)  TempSrc:   Oral   SpO2: 96% 96% 96% 99%  Weight:      Height:        Intake/Output Summary (Last 24 hours) at 02/05/2020 1801 Last data filed at 02/05/2020 1305 Gross per 24 hour  Intake 500 ml  Output 700 ml  Net -200 ml   Filed Weights   02/02/20 1146 02/02/20 2102 02/05/20 1019  Weight: 61.7 kg 55.8 kg 55.8 kg    Exam:  General: NAD   Cardiovascular: S1, S2 present  Respiratory: CTAB  Abdomen: Soft, nontender, nondistended, bowel sounds present  Musculoskeletal: No bilateral pedal edema noted  Skin: Normal  Psychiatry: Normal mood   Data Reviewed: CBC: Recent Labs  Lab 02/02/20 1150 02/03/20 0325 02/04/20 0208 02/05/20 0201  WBC 14.5* 16.3* 14.1* 12.0*  NEUTROABS  --  13.5* 11.9* 9.7*  HGB 15.2 13.2 12.4* 12.5*  HCT 45.7 38.7* 36.3* 37.0*  MCV 73.1* 71.0* 71.7* 71.6*    PLT 222 165 136* [id number]   Basic Metabolic Panel: Recent Labs  Lab 02/02/20 1150 02/02/20 1935 02/03/20 0325 02/04/20 0208 02/05/20 0201  NA 133*  --  134* 133* 136  K 3.8  --  3.7 3.4* 3.3*  CL 99  --  102 103 103  CO2 21*  --  23 23 23   GLUCOSE 112*  --  96 108* 80  BUN 10  --  10 6* 7*  CREATININE 0.89  --  1.03 0.84 0.83  CALCIUM 9.2  --  8.6* 8.3* 8.4*  MG  --  1.8  --   --   --    GFR: Estimated Creatinine Clearance: 63.5 mL/min (by C-G formula based on SCr of 0.83 mg/dL). Liver Function Tests: Recent Labs  Lab 02/02/20 1150 02/03/20 0325 02/04/20 0208 02/05/20 0201  AST 176* 64* 27 19  ALT 152* 84* 52* 38  ALKPHOS 313* 213* 180* 162*  BILITOT 9.7* 7.6* 3.7* 3.0*  PROT 7.7 6.4* 6.2* 6.2*  ALBUMIN 3.8 3.0* 2.7* 2.5*   Recent Labs  Lab 02/02/20 1150 02/04/20 0208  LIPASE 21 18   No results for input(s): AMMONIA in the last 168 hours. Coagulation Profile: Recent Labs  Lab 02/02/20 2143 02/03/20 0325 02/04/20 0808  INR 1.2 1.3* 1.2   Cardiac Enzymes: No results for input(s): CKTOTAL, CKMB, CKMBINDEX, TROPONINI in the last 168 hours. BNP (last 3 results) No results for input(s): PROBNP in the last 8760 hours. HbA1C: No results for input(s): HGBA1C in the last 72 hours. CBG: Recent Labs  Lab 02/04/20 0945 02/04/20 1623 02/04/20 2342 02/05/20 0801 02/05/20 1647  GLUCAP 85 77 96 74 115*   Lipid Profile: No results for input(s): CHOL, HDL, LDLCALC, TRIG, CHOLHDL, LDLDIRECT in the last 72 hours. Thyroid Function Tests: Recent Labs    02/03/20 0325  TSH 0.069*   Anemia Panel: No results for input(s): VITAMINB12, FOLATE, FERRITIN, TIBC, IRON, RETICCTPCT in the last 72 hours. Urine analysis:    Component Value Date/Time   COLORURINE AMBER (A) 02/02/2020 1830   APPEARANCEUR CLEAR 02/02/2020 1830   LABSPEC >1.046 (H) 02/02/2020 1830   PHURINE 6.0 02/02/2020 1830   GLUCOSEU NEGATIVE 02/02/2020 1830   HGBUR SMALL (A) 02/02/2020 1830    BILIRUBINUR SMALL (A) 02/02/2020 1830   KETONESUR NEGATIVE 02/02/2020 1830   PROTEINUR NEGATIVE 02/02/2020 1830   UROBILINOGEN 1.0 09/04/2015 2309   NITRITE NEGATIVE 02/02/2020 1830   LEUKOCYTESUR NEGATIVE 02/02/2020 1830   Sepsis Labs: @LABRCNTIP (procalcitonin:4,lacticidven:4)  ) Recent Results (from the past 240 hour(s))  Novel Coronavirus, NAA (Hosp order, Send-out to Ref Lab; TAT 18-24 hrs     Status: None   Collection Time: 02/02/20 11:04 AM   Specimen: Nasopharyngeal Swab; Respiratory  Result Value Ref Range Status   SARS-CoV-2, NAA NOT DETECTED NOT DETECTED Final    Comment: (NOTE) This nucleic acid amplification test was developed and its performance characteristics determined by [employer]. Nucleic acid amplification tests include RT-PCR and TMA. This test has not been FDA cleared or approved. This test has been  authorized by FDA under an Emergency Use Authorization (EUA). This test is only authorized for the duration of time the declaration that circumstances exist justifying the authorization of the emergency use of in vitro diagnostic tests for detection of SARS-CoV-2 virus and/or diagnosis of COVID-19 infection under section 564(b)(1) of the Act, 21 U.S.C. [id number]) (1), unless the authorization is terminated or revoked sooner. When diagnostic testing is negative, the possibility of a false negative result should be considered in the context of a patient's recent exposures and the presence of clinical signs and symptoms consistent with COVID-19. An individual without symptoms of COVID- 19 and who is not shedding SARS-CoV-2  virus would expect to have a negative (not detected) result in this assay. Performed At: [facility] [address] [place], [place] [id number] Rush Farmer MD [id number]    [place]  Final    Comment: Performed at [facility], [place] [address], [place], [place] [address]  Blood culture  (routine x 2)     Status: None (Preliminary result)   Collection Time: 02/02/20  4:22 PM   Specimen: BLOOD  Result Value Ref Range Status   Specimen Description BLOOD LEFT ANTECUBITAL  Final   Special Requests   Final    BOTTLES DRAWN AEROBIC AND ANAEROBIC Blood Culture adequate volume   Culture   Final    NO GROWTH 3 DAYS Performed at [facility], [place] [address], [place], [place] [address]    Report Status PENDING  Incomplete  Blood culture (routine x 2)     Status: None (Preliminary result)   Collection Time: 02/02/20  4:22 PM   Specimen: BLOOD  Result Value Ref Range Status   Specimen Description BLOOD RIGHT ANTECUBITAL  Final   Special Requests   Final    BOTTLES DRAWN AEROBIC AND ANAEROBIC Blood Culture results may not be optimal due to an excessive volume of blood received in culture bottles   Culture   Final    NO GROWTH 3 DAYS Performed at [facility], [place] [address] [place], [place] [address]    Report Status PENDING  Incomplete  Respiratory Panel by RT PCR (Flu A&B, Covid) - Nasopharyngeal Swab     Status: None   Collection Time: 02/02/20  6:22 PM   Specimen: Nasopharyngeal Swab  Result Value Ref Range Status   SARS Coronavirus 2 by RT PCR NEGATIVE NEGATIVE Final    Comment: (NOTE) SARS-CoV-2 target nucleic acids are NOT DETECTED. The SARS-CoV-2 RNA is generally detectable in upper respiratoy specimens during the acute phase of infection. The lowest concentration of SARS-CoV-2 viral copies this assay can detect is 131 copies/mL. A negative result does not preclude SARS-Cov-2 infection and should not be used as the sole basis for treatment or other patient management decisions. A negative result may occur with  improper specimen collection/handling, submission of specimen other than nasopharyngeal swab, presence of viral mutation(s) within the areas targeted by this assay, and inadequate number of viral copies (<131 copies/mL). A negative result must be  combined with clinical observations, patient history, and epidemiological information. The expected result is Negative. Fact Sheet for Patients:  PinkCheek.be Fact Sheet for Healthcare Providers:  GravelBags.it This test is not yet ap proved or cleared by the [place] FDA and  has been authorized for detection and/or diagnosis of SARS-CoV-2 by FDA under an Emergency Use Authorization (EUA). This EUA will remain  in effect (meaning this test can be used) for the duration  of the COVID-19 declaration under Section 564(b)(1) of the Act, 21 U.S.C. section 360bbb-3(b)(1), unless the authorization is terminated or revoked sooner.    Influenza A by PCR NEGATIVE NEGATIVE Final   Influenza B by PCR NEGATIVE NEGATIVE Final    Comment: (NOTE) The Xpert Xpress SARS-CoV-2/FLU/RSV assay is intended as an aid in  the diagnosis of influenza from Nasopharyngeal swab specimens and  should not be used as a sole basis for treatment. Nasal washings and  aspirates are unacceptable for Xpert Xpress SARS-CoV-2/FLU/RSV  testing. Fact Sheet for Patients: PinkCheek.be Fact Sheet for Healthcare Providers: GravelBags.it This test is not yet approved or cleared by the [place] FDA and  has been authorized for detection and/or diagnosis of SARS-CoV-2 by  FDA under an Emergency Use Authorization (EUA). This EUA will remain  in effect (meaning this test can be used) for the duration of the  Covid-19 declaration under Section 564(b)(1) of the Act, 21  U.S.C. section 360bbb-3(b)(1), unless the authorization is  terminated or revoked. Performed at [facility], [place] [address], [place], [place] [address]       Studies: DG C-Arm 1-60 Min-No Report  Result Date: 02/05/2020 Fluoroscopy was utilized by the requesting physician.  No radiographic interpretation.    Scheduled Meds: .  folic acid  1 mg Oral Daily  . levothyroxine  67.5 mcg Intravenous Daily  . multivitamin with minerals  1 tablet Oral Daily  . sodium chloride flush  3 mL Intravenous Once  . thiamine  100 mg Oral Daily   Or  . thiamine  100 mg Intravenous Daily    Continuous Infusions: . sodium chloride 100 mL/hr at 02/05/20 1438  . ampicillin-sulbactam (UNASYN) IV 1.5 g (02/05/20 1440)     LOS: 3 days     Alma Friendly, MD [facility]  If 7PM-7AM, please contact night-coverage [email or web address] 02/05/2020, 6:01 PM

## 2020-02-05 NOTE — Anesthesia Postprocedure Evaluation (Signed)
Anesthesia Post Note  Patient: Rodney Romero  Procedure(s) Performed: ENDOSCOPIC RETROGRADE CHOLANGIOPANCREATOGRAPHY (ERCP) (N/A ) SPHINCTEROTOMY     Patient location during evaluation: PACU Anesthesia Type: General Level of consciousness: awake and alert Pain management: pain level controlled Vital Signs Assessment: post-procedure vital signs reviewed and stable Respiratory status: spontaneous breathing, nonlabored ventilation, respiratory function stable and patient connected to nasal cannula oxygen Cardiovascular status: blood pressure returned to baseline and stable Postop Assessment: no apparent nausea or vomiting Anesthetic complications: no    Last Vitals:  Vitals:   02/05/20 1019 02/05/20 1304  BP: (!) 146/61 (!) 120/56  Pulse: 63 75  Resp: 20 (!) 21  Temp: 37.2 C (!) 36.2 C  SpO2: 98% 95%    Last Pain:  Vitals:   02/05/20 1304  TempSrc: Oral  PainSc: 0-No pain                 Davene Jobin DAVID

## 2020-02-05 NOTE — Progress Notes (Signed)
Rodney Romero 10:50 AM  Subjective: Patient doing about the same no new complaints and again his case was discussed with the surgical team  Objective: Vital signs stable afebrile exam please see preassessment evaluation mild right upper quadrant pain no rebound nontender otherwise occasional bowel sounds liver test improved white count improved  Assessment: Abnormal MRCP worrisome for cholangiocarcinoma  Plan: Okay to proceed with ERCP with anesthesia assistance with further work-up plans and recommendations pending those findings  [facility]  office [phone] After 5PM or if no answer call [phone]

## 2020-02-05 NOTE — Progress Notes (Signed)
[facility] Progress Note  Day of Surgery  Subjective: Patient reports some mild abdominal pain in RUQ. Denies nausea. +flatus, no BM. No dysuria but urine is dark. He is alert and oriented. Tmax 100.8 for last 24 hrs.   Review of Systems  Respiratory: Negative for shortness of breath.   Cardiovascular: Negative for chest pain.  Gastrointestinal: Positive for abdominal pain and constipation. Negative for nausea and vomiting.  Genitourinary: Negative for dysuria, frequency and urgency.     Objective: Vital signs in last 24 hours: Temp:  [98.2 F (36.8 C)-100.8 F (38.2 C)] 100 F (37.8 C) (03/04 0433) Pulse Rate:  [54-69] 69 (03/04 0433) Resp:  [14-17] 17 (03/04 0433) BP: (114-136)/(62-70) 124/62 (03/04 0433) SpO2:  [95 %-99 %] 95 % (03/04 0433) Last BM Date: 01/01/20  Intake/Output from previous day: 03/03 0701 - 03/04 0700 In: 0  Out: 1050 [Urine:1050] Intake/Output this shift: No intake/output data recorded.  PE: General: pleasant, WD, WN male who is laying in bed in NAD HEENT:  Sclera are anicteric.  PERRL.  Ears and nose without any masses or lesions.  Mouth is pink and moist Heart: regular, rate, and rhythm.  Normal s1,s2. No obvious murmurs, gallops, or rubs noted.  Palpable radial and pedal pulses bilaterally Lungs: CTAB, no wheezes, rhonchi, or rales noted.  Respiratory effort nonlabored Abd: soft, mildly ttp in RUQ without peritonitis, mild voluntary guarding, ND, +BS MS: all 4 extremities are symmetrical with no cyanosis, clubbing, or edema. Skin: warm and dry with no masses, lesions, or rashes Neuro: Cranial nerves 2-12 grossly intact, sensation grossly intact throughout Psych: A&Ox3 with an appropriate affect.   Lab Results:  Recent Labs    02/04/20 0208 02/05/20 0201  WBC 14.1* 12.0*  HGB 12.4* 12.5*  HCT 36.3* 37.0*  PLT 136* 163   BMET Recent Labs    02/04/20 0208 02/05/20 0201  NA 133* 136  K 3.4* 3.3*  CL 103 103  CO2 23 23   GLUCOSE 108* 80  BUN 6* 7*  CREATININE 0.84 0.83  CALCIUM 8.3* 8.4*   PT/INR Recent Labs    02/03/20 0325 02/04/20 0808  LABPROT 15.8* 14.6  INR 1.3* 1.2   CMP     Component Value Date/Time   NA 136 02/05/2020 0201   K 3.3 (L) 02/05/2020 0201   CL 103 02/05/2020 0201   CO2 23 02/05/2020 0201   GLUCOSE 80 02/05/2020 0201   BUN 7 (L) 02/05/2020 0201   CREATININE 0.83 02/05/2020 0201   CALCIUM 8.4 (L) 02/05/2020 0201   PROT 6.2 (L) 02/05/2020 0201   ALBUMIN 2.5 (L) 02/05/2020 0201   AST 19 02/05/2020 0201   ALT 38 02/05/2020 0201   ALKPHOS 162 (H) 02/05/2020 0201   BILITOT 3.0 (H) 02/05/2020 0201   GFRNONAA >60 02/05/2020 0201   GFRAA >60 02/05/2020 0201   Lipase     Component Value Date/Time   LIPASE 18 02/04/2020 0208       Studies/Results: MR ABDOMEN MRCP WO CONTRAST  Result Date: 02/03/2020 CLINICAL DATA:  Cholelithiasis. EXAM: MRI ABDOMEN WITHOUT CONTRAST  (INCLUDING MRCP) TECHNIQUE: Multiplanar multisequence MR imaging of the abdomen was performed. Heavily T2-weighted images of the biliary and pancreatic ducts were obtained, and three-dimensional MRCP images were rendered by post processing. COMPARISON:  CT 02/02/2020 FINDINGS: Lower chest:  Lung bases are clear. Hepatobiliary: Mild intrahepatic biliary duct dilatation. The common hepatic duct and common bile duct are normal caliber. There is a gap between the extrahepatic  bile ducts and the mid common hepatic duct measuring 8 mm as seen on MRCP image 08/1029. Also appreciated on image 37/26. There is fullness in the neck of the gallbladder (image 9/21). No filling defect within the common bile duct to suggest choledocholithiasis. Common bile duct measures upper limits of normal at 6 mm. There is marked gallbladder wall thickening and pericholecystic fluid. Gallbladder wall measures up to 8 mm. No clear stones within the gallbladder. Extensive inflammation fluid surrounding the gallbladder fossa. Fluid extends into  Morrison's pouch and along the RIGHT pericolic gutter. Pancreas: Normal pancreatic parenchymal intensity. No ductal dilatation or inflammation. No variant ductal anatomy. Spleen: Normal spleen. Adrenals/urinary tract: Adrenal glands normal. Multilobulated cystic complex in the lower pole RIGHT kidney measures 3.6 cm. Intervening cyst walls are thin. No complexity. Stomach/Bowel: Stomach and limited of the small bowel is unremarkable Vascular/Lymphatic: Abdominal aortic normal caliber. No retroperitoneal periportal lymphadenopathy. Musculoskeletal: No aggressive osseous lesion IMPRESSION: 1. No choledocholithiasis. 2. Severe inflammation surrounding gallbladder fossa with gallbladder wall thickening suggests acute cholecystitis. 3. Interruption of the common hepatic duct at the level of the extrahepatic bile ducts. This may relate to edema within the neck of the gallbladder affecting the common hepatic duct. Conversely findings could represent cholangitis or cholangiocarcinoma secondarily obstructing the neck of the gallbladder. No gallstones present to explain cholecystitis. Minimal intrahepatic duct dilatation currently. ERCP may be warranted to evaluate the common hepatic duct. 4. Mild intrahepatic duct dilatation. These results will be called to the ordering clinician or representative by the Radiologist Assistant, and communication documented in the PACS or zVision Dashboard. Electronically Signed   By: Suzy Bouchard M.D.   On: 02/03/2020 13:38   MR 3D Recon At Scanner  Result Date: 02/03/2020 CLINICAL DATA:  Cholelithiasis. EXAM: MRI ABDOMEN WITHOUT CONTRAST  (INCLUDING MRCP) TECHNIQUE: Multiplanar multisequence MR imaging of the abdomen was performed. Heavily T2-weighted images of the biliary and pancreatic ducts were obtained, and three-dimensional MRCP images were rendered by post processing. COMPARISON:  CT 02/02/2020 FINDINGS: Lower chest:  Lung bases are clear. Hepatobiliary: Mild intrahepatic  biliary duct dilatation. The common hepatic duct and common bile duct are normal caliber. There is a gap between the extrahepatic bile ducts and the mid common hepatic duct measuring 8 mm as seen on MRCP image 08/1029. Also appreciated on image 37/26. There is fullness in the neck of the gallbladder (image 9/21). No filling defect within the common bile duct to suggest choledocholithiasis. Common bile duct measures upper limits of normal at 6 mm. There is marked gallbladder wall thickening and pericholecystic fluid. Gallbladder wall measures up to 8 mm. No clear stones within the gallbladder. Extensive inflammation fluid surrounding the gallbladder fossa. Fluid extends into Morrison's pouch and along the RIGHT pericolic gutter. Pancreas: Normal pancreatic parenchymal intensity. No ductal dilatation or inflammation. No variant ductal anatomy. Spleen: Normal spleen. Adrenals/urinary tract: Adrenal glands normal. Multilobulated cystic complex in the lower pole RIGHT kidney measures 3.6 cm. Intervening cyst walls are thin. No complexity. Stomach/Bowel: Stomach and limited of the small bowel is unremarkable Vascular/Lymphatic: Abdominal aortic normal caliber. No retroperitoneal periportal lymphadenopathy. Musculoskeletal: No aggressive osseous lesion IMPRESSION: 1. No choledocholithiasis. 2. Severe inflammation surrounding gallbladder fossa with gallbladder wall thickening suggests acute cholecystitis. 3. Interruption of the common hepatic duct at the level of the extrahepatic bile ducts. This may relate to edema within the neck of the gallbladder affecting the common hepatic duct. Conversely findings could represent cholangitis or cholangiocarcinoma secondarily obstructing the neck of the gallbladder. No  gallstones present to explain cholecystitis. Minimal intrahepatic duct dilatation currently. ERCP may be warranted to evaluate the common hepatic duct. 4. Mild intrahepatic duct dilatation. These results will be called  to the ordering clinician or representative by the Radiologist Assistant, and communication documented in the PACS or zVision Dashboard. Electronically Signed   By: Suzy Bouchard M.D.   On: 02/03/2020 13:38    Anti-infectives: Anti-infectives (From admission, onward)   Start     Dose/Rate Route Frequency Ordered Stop   02/03/20 1500  ampicillin-sulbactam (UNASYN) 1.5 g in sodium chloride 0.9 % 100 mL IVPB     1.5 g 200 mL/hr over 30 Minutes Intravenous Every 6 hours 02/03/20 1409     02/02/20 2200  piperacillin-tazobactam (ZOSYN) IVPB 3.375 g  Status:  Discontinued     3.375 g 12.5 mL/hr over 240 Minutes Intravenous Every 8 hours 02/02/20 2134 02/03/20 1415   02/02/20 1645  piperacillin-tazobactam (ZOSYN) IVPB 3.375 g     3.375 g 100 mL/hr over 30 Minutes Intravenous  Once 02/02/20 1635 02/02/20 1817       Assessment/Plan HTN Hypothyroidism PTSD Bipolar Disorder  Transaminitis with hyperbilirubinemia  - appears to have at least acalculous cholecystitis, possible development of cholangitis. MRCP with extrahepatic CHD interruption, possibly external compression from gallbladder vs malignancy (cholangiocarcinoma).Despite downtrending of Tbili to 3.0 today, with elevated tumor markers of Ca 19-9 (46) and CEA (11.3) in the setting of recent 5-10 pound weight loss, suspicion for malignancy remains high. - If this is a cholangiocarcinoma, surgical management based on the location on MRCP would be an en bloc extrahepatic bile duct resection woth regional lymphadenectomy and roux-en-y hepaticojejunostomy. If malignancy can be reasonably ruled out with PET scan and staging laparoscopy if endoscopic brushings and IDUS are negative, would recommend cholecystectomy - given amount of inflammation, he is at higher risk for conversion to open cholecystectomy  - plan for ERCP today with Dr. Watt Climes  - Continue abx. If patient becomes acutely ill, would recommend emergent biliary decompression by  GI/IR  FEN: NPO, IVF VTE: SCDs ID: [email or web address] 3/1>3/2; Unasyn 3/2>>  LOS: 3 days    Brigid Re , [facility] 02/05/2020, 8:15 AM Please see Amion for pager number during day hours 7:00am-4:30pm

## 2020-02-05 NOTE — Anesthesia Procedure Notes (Signed)
Procedure Name: Intubation Date/Time: 02/05/2020 11:01 AM Performed by: Orlie Dakin, CRNA Pre-anesthesia Checklist: Patient identified, Emergency Drugs available, Suction available and Patient being monitored Patient Re-evaluated:Patient Re-evaluated prior to induction Oxygen Delivery Method: Circle system utilized Preoxygenation: Pre-oxygenation with 100% oxygen Induction Type: IV induction Ventilation: Mask ventilation without difficulty and Oral airway inserted - appropriate to patient size Laryngoscope Size: Mac and 4 Grade View: Grade II Tube type: Oral Tube size: 7.0 mm Number of attempts: 1 Airway Equipment and Method: Stylet Placement Confirmation: ETT inserted through vocal cords under direct vision,  positive ETCO2 and breath sounds checked- equal and bilateral Secured at: 24 cm Tube secured with: Tape Dental Injury: Teeth and Oropharynx as per pre-operative assessment

## 2020-02-05 NOTE — Progress Notes (Signed)
Patient's HR hovering in the 40's, all other VS stable, glucose 115. Patient states he feels lightheaded when lying in bed. MD paged. Awaiting response. Will continue to monitor.

## 2020-02-05 NOTE — Anesthesia Preprocedure Evaluation (Signed)
Anesthesia Evaluation  Patient identified by MRN, date of birth, ID band Patient awake    Reviewed: Allergy & Precautions, NPO status , Patient's Chart, lab work & pertinent test results  Airway Mallampati: I  TM Distance: >3 FB Neck ROM: Full    Dental   Pulmonary Current Smoker and Patient abstained from smoking.,    Pulmonary exam normal        Cardiovascular hypertension, Pt. on medications Normal cardiovascular exam     Neuro/Psych Anxiety Bipolar Disorder    GI/Hepatic   Endo/Other    Renal/GU      Musculoskeletal   Abdominal   Peds  Hematology   Anesthesia Other Findings   Reproductive/Obstetrics                             Anesthesia Physical Anesthesia Plan  ASA: III  Anesthesia Plan: General   Post-op Pain Management:    Induction: Intravenous  PONV Risk Score and Plan: 1 and Ondansetron and Midazolam  Airway Management Planned: Oral ETT  Additional Equipment:   Intra-op Plan:   Post-operative Plan: Extubation in OR  Informed Consent: I have reviewed the patients History and Physical, chart, labs and discussed the procedure including the risks, benefits and alternatives for the proposed anesthesia with the patient or authorized representative who has indicated his/her understanding and acceptance.       Plan Discussed with: CRNA and Surgeon  Anesthesia Plan Comments:         Anesthesia Quick Evaluation

## 2020-02-06 ENCOUNTER — Encounter (HOSPITAL_COMMUNITY): Payer: Self-pay | Admitting: Internal Medicine

## 2020-02-06 ENCOUNTER — Inpatient Hospital Stay (HOSPITAL_COMMUNITY): Payer: Medicare Other

## 2020-02-06 HISTORY — PX: IR ENDOLUMINAL BX OF BILIARY TREE: IMG6053

## 2020-02-06 HISTORY — PX: IR INT EXT BILIARY DRAIN WITH CHOLANGIOGRAM: IMG6044

## 2020-02-06 LAB — CBC WITH DIFFERENTIAL/PLATELET
Abs Immature Granulocytes: 0.03 10*3/uL (ref 0.00–0.07)
Basophils Absolute: 0 10*3/uL (ref 0.0–0.1)
Basophils Relative: 0 %
Eosinophils Absolute: 0 10*3/uL (ref 0.0–0.5)
Eosinophils Relative: 0 %
HCT: 36.2 % — ABNORMAL LOW (ref 39.0–52.0)
Hemoglobin: 12.3 g/dL — ABNORMAL LOW (ref 13.0–17.0)
Immature Granulocytes: 0 %
Lymphocytes Relative: 11 %
Lymphs Abs: 0.9 10*3/uL (ref 0.7–4.0)
MCH: 24.4 pg — ABNORMAL LOW (ref 26.0–34.0)
MCHC: 34 g/dL (ref 30.0–36.0)
MCV: 71.7 fL — ABNORMAL LOW (ref 80.0–100.0)
Monocytes Absolute: 0.5 10*3/uL (ref 0.1–1.0)
Monocytes Relative: 6 %
Neutro Abs: 6.5 10*3/uL (ref 1.7–7.7)
Neutrophils Relative %: 83 %
Platelets: 236 10*3/uL (ref 150–400)
RBC: 5.05 MIL/uL (ref 4.22–5.81)
RDW: 14 % (ref 11.5–15.5)
WBC: 7.9 10*3/uL (ref 4.0–10.5)
nRBC: 0 % (ref 0.0–0.2)

## 2020-02-06 LAB — COMPREHENSIVE METABOLIC PANEL
ALT: 27 U/L (ref 0–44)
AST: 13 U/L — ABNORMAL LOW (ref 15–41)
Albumin: 2.5 g/dL — ABNORMAL LOW (ref 3.5–5.0)
Alkaline Phosphatase: 157 U/L — ABNORMAL HIGH (ref 38–126)
Anion gap: 10 (ref 5–15)
BUN: 14 mg/dL (ref 8–23)
CO2: 22 mmol/L (ref 22–32)
Calcium: 8.3 mg/dL — ABNORMAL LOW (ref 8.9–10.3)
Chloride: 105 mmol/L (ref 98–111)
Creatinine, Ser: 0.77 mg/dL (ref 0.61–1.24)
GFR calc Af Amer: >60 mL/min (ref >60)
GFR calc non Af Amer: >60 mL/min (ref >60)
Glucose, Bld: 130 mg/dL — ABNORMAL HIGH (ref 70–99)
Potassium: 3.6 mmol/L (ref 3.5–5.1)
Sodium: 137 mmol/L (ref 135–145)
Total Bilirubin: 1.7 mg/dL — ABNORMAL HIGH (ref 0.3–1.2)
Total Protein: 5.9 g/dL — ABNORMAL LOW (ref 6.5–8.1)

## 2020-02-06 LAB — GLUCOSE, CAPILLARY
Glucose-Capillary: 104 mg/dL — ABNORMAL HIGH (ref 70–99)
Glucose-Capillary: 110 mg/dL — ABNORMAL HIGH (ref 70–99)
Glucose-Capillary: 111 mg/dL — ABNORMAL HIGH (ref 70–99)

## 2020-02-06 MED ORDER — IOHEXOL 300 MG/ML  SOLN
50.0000 mL | Freq: Once | INTRAMUSCULAR | Status: AC | PRN
  Administered 2020-02-06: 13:00:00 30 mL

## 2020-02-06 MED ORDER — SODIUM CHLORIDE 0.9 % IV SOLN
INTRAVENOUS | Status: AC | PRN
  Administered 2020-02-06: 10 mL/h via INTRAVENOUS

## 2020-02-06 MED ORDER — FENTANYL CITRATE (PF) 100 MCG/2ML IJ SOLN
INTRAMUSCULAR | Status: AC
  Filled 2020-02-06: qty 2

## 2020-02-06 MED ORDER — MIDAZOLAM HCL 2 MG/2ML IJ SOLN
INTRAMUSCULAR | Status: AC
  Filled 2020-02-06: qty 2

## 2020-02-06 MED ORDER — LIDOCAINE HCL 1 % IJ SOLN
INTRAMUSCULAR | Status: AC
  Filled 2020-02-06: qty 20

## 2020-02-06 MED ORDER — FENTANYL CITRATE (PF) 100 MCG/2ML IJ SOLN
INTRAMUSCULAR | Status: AC | PRN
  Administered 2020-02-06: 25 ug via INTRAVENOUS
  Administered 2020-02-06: 50 ug via INTRAVENOUS
  Administered 2020-02-06 (×4): 25 ug via INTRAVENOUS

## 2020-02-06 MED ORDER — MIDAZOLAM HCL 2 MG/2ML IJ SOLN
INTRAMUSCULAR | Status: AC | PRN
  Administered 2020-02-06 (×4): 0.5 mg via INTRAVENOUS

## 2020-02-06 NOTE — Progress Notes (Signed)
PROGRESS NOTE  MARCIAL THAI [id number] DOB: 1946/01/01 DOA: 02/02/2020 PCP: [facility]  HPI/Recap of past 24 hours: [name] is a 74 y.o. male with history of hypertension hypothyroidism bipolar disorder, alcohol abuse presents to the ER because of abdominal pain.  Patient states he has been abdominal pain mostly in the right upper quadrant for the last 2 days.  Has some subjective feeling of fever chills and diaphoresis.  Denies chest pain shortness of breath.  Denies any vomiting or diarrhea. In the ED, RUQ showed features concerning for cholecystitis. Labs showed AST 176 ALT 152 creatinine 1.8 alkaline phosphatase 313 total bilirubin 9.7.  CBC shows WBC of 14.5 high sensitive troponin was 5 and 4 EKG shows normal sinus rhythm.  General surgery, GI consulted.  Patient admitted for further management.     Today, pt reports RUQ abdominal pain, denies any N/V, fever/chills, chest pain, SOB.    Assessment/Plan: Principal Problem:   Abdominal pain Active Problems:   Elevated LFTs   Hypertension   Cholecystitis, acute   Acute cholecystitis versus cholangiocarcinoma versus Mirizzi syndrome s/p IR biliary drain placement with cholangiogram on 02/06/20 Currently afebrile, with resolving leukocytosis BC x2, NGTD CA 19-9 46, CEA 11.3, GGT 199 Right upper quadrant ultrasound with possible acute cholecystitis CT abdomen pelvis showed acute cholecystitis GI on board, s/p ERCP on 02/05/2020, unfortunately scope position was very difficult despite multiple maneuvers General surgery on board, recommend percutaneous cholecystostomy by IR S/P IR biliary drain placement with cholangiogram on 02/06/20  IV fluids, n.p.o. Continue IV Unasyn Monitor closely  Hyperbilirubinemia T bili currently trending down Continue to trend  Sinus bradycardia Improved Telemetry reported HR in 40s on 02/05/20, checked manually at bedside, HR in 50s, EKG showed sinus  brady, HR 50s. Asymptomatic. Bedside continuous monitoring ECHO pending Continue telemetry  Hypertension Continue IV hydralazine as needed  Hypothyroidism Free T4, TSH pending Continue IV Synthroid  Alcohol abuse Counseled against abuse CIWA protocol        Malnutrition Type:      Malnutrition Characteristics:      Nutrition Interventions:       Estimated body mass index is 19.27 kg/m as calculated from the following:   Height as of this encounter: 5\' 7"  (1.702 m).   Weight as of this encounter: 55.8 kg.     Code Status: Full  Family Communication: Discussed with wife at bedside on 02/05/20  Disposition Plan: Possibly Home likely after sign off from IR, surgery   Consultants:  GI  General surgery  IR  Procedures:  ERCP on 02/05/20  Antimicrobials:  Unasyn  DVT prophylaxis: SCDs   Objective: Vitals:   02/06/20 1245 02/06/20 1250 02/06/20 1255 02/06/20 1326  BP: (!) 171/86 (!) 163/92 (!) 147/80 (!) 156/96  Pulse: 60 64 61 60  Resp: 10 11 12    Temp:    97.9 F (36.6 C)  TempSrc:    Oral  SpO2: 95% 96% 94% 94%  Weight:      Height:        Intake/Output Summary (Last 24 hours) at 02/06/2020 1731 Last data filed at 02/06/2020 1011 Gross per 24 hour  Intake 2113.69 ml  Output 525 ml  Net 1588.69 ml   Filed Weights   02/02/20 1146 02/02/20 2102 02/05/20 1019  Weight: 61.7 kg 55.8 kg 55.8 kg    Exam:  General: NAD   Cardiovascular: S1, S2 present  Respiratory: CTAB  Abdomen: Soft, +tender RUQ, nondistended,  bowel sounds present  Musculoskeletal: No bilateral pedal edema noted  Skin: Normal  Psychiatry: Normal mood   Data Reviewed: CBC: Recent Labs  Lab 02/02/20 1150 02/03/20 0325 02/04/20 0208 02/05/20 0201 02/06/20 0246  WBC 14.5* 16.3* 14.1* 12.0* 7.9  NEUTROABS  --  13.5* 11.9* 9.7* 6.5  HGB 15.2 13.2 12.4* 12.5* 12.3*  HCT 45.7 38.7* 36.3* 37.0* 36.2*  MCV 73.1* 71.0* 71.7* 71.6* 71.7*  PLT 222 165 136*  163 [id number]   Basic Metabolic Panel: Recent Labs  Lab 02/02/20 1150 02/02/20 1935 02/03/20 0325 02/04/20 0208 02/05/20 0201 02/06/20 0246  NA 133*  --  134* 133* 136 137  K 3.8  --  3.7 3.4* 3.3* 3.6  CL 99  --  102 103 103 105  CO2 21*  --  23 23 23 22   GLUCOSE 112*  --  96 108* 80 130*  BUN 10  --  10 6* 7* 14  CREATININE 0.89  --  1.03 0.84 0.83 0.77  CALCIUM 9.2  --  8.6* 8.3* 8.4* 8.3*  MG  --  1.8  --   --   --   --    GFR: Estimated Creatinine Clearance: 65.9 mL/min (by C-G formula based on SCr of 0.77 mg/dL). Liver Function Tests: Recent Labs  Lab 02/02/20 1150 02/03/20 0325 02/04/20 0208 02/05/20 0201 02/06/20 0246  AST 176* 64* 27 19 13*  ALT 152* 84* 52* 38 27  ALKPHOS 313* 213* 180* 162* 157*  BILITOT 9.7* 7.6* 3.7* 3.0* 1.7*  PROT 7.7 6.4* 6.2* 6.2* 5.9*  ALBUMIN 3.8 3.0* 2.7* 2.5* 2.5*   Recent Labs  Lab 02/02/20 1150 02/04/20 0208  LIPASE 21 18   No results for input(s): AMMONIA in the last 168 hours. Coagulation Profile: Recent Labs  Lab 02/02/20 2143 02/03/20 0325 02/04/20 0808  INR 1.2 1.3* 1.2   Cardiac Enzymes: No results for input(s): CKTOTAL, CKMB, CKMBINDEX, TROPONINI in the last 168 hours. BNP (last 3 results) No results for input(s): PROBNP in the last 8760 hours. HbA1C: No results for input(s): HGBA1C in the last 72 hours. CBG: Recent Labs  Lab 02/04/20 2342 02/05/20 0801 02/05/20 1647 02/05/20 2347 02/06/20 0818  GLUCAP 96 74 115* 210* 111*   Lipid Profile: No results for input(s): CHOL, HDL, LDLCALC, TRIG, CHOLHDL, LDLDIRECT in the last 72 hours. Thyroid Function Tests: No results for input(s): TSH, T4TOTAL, FREET4, T3FREE, THYROIDAB in the last 72 hours. Anemia Panel: No results for input(s): VITAMINB12, FOLATE, FERRITIN, TIBC, IRON, RETICCTPCT in the last 72 hours. Urine analysis:    Component Value Date/Time   COLORURINE AMBER (A) 02/02/2020 1830   APPEARANCEUR CLEAR 02/02/2020 1830   LABSPEC >1.046 (H)  02/02/2020 1830   PHURINE 6.0 02/02/2020 1830   GLUCOSEU NEGATIVE 02/02/2020 1830   HGBUR SMALL (A) 02/02/2020 1830   BILIRUBINUR SMALL (A) 02/02/2020 1830   KETONESUR NEGATIVE 02/02/2020 1830   PROTEINUR NEGATIVE 02/02/2020 1830   UROBILINOGEN 1.0 09/04/2015 2309   NITRITE NEGATIVE 02/02/2020 1830   LEUKOCYTESUR NEGATIVE 02/02/2020 1830   Sepsis Labs: @LABRCNTIP (procalcitonin:4,lacticidven:4)  ) Recent Results (from the past 240 hour(s))  Novel Coronavirus, NAA (Hosp order, Send-out to Ref Lab; TAT 18-24 hrs     Status: None   Collection Time: 02/02/20 11:04 AM   Specimen: Nasopharyngeal Swab; Respiratory  Result Value Ref Range Status   SARS-CoV-2, NAA NOT DETECTED NOT DETECTED Final    Comment: (NOTE) This nucleic acid amplification test was developed and its performance characteristics determined by  [employer]. Nucleic acid amplification tests include RT-PCR and TMA. This test has not been FDA cleared or approved. This test has been authorized by FDA under an Emergency Use Authorization (EUA). This test is only authorized for the duration of time the declaration that circumstances exist justifying the authorization of the emergency use of in vitro diagnostic tests for detection of SARS-CoV-2 virus and/or diagnosis of COVID-19 infection under section 564(b)(1) of the Act, 21 U.S.C. [id number]) (1), unless the authorization is terminated or revoked sooner. When diagnostic testing is negative, the possibility of a false negative result should be considered in the context of a patient's recent exposures and the presence of clinical signs and symptoms consistent with COVID-19. An individual without symptoms of COVID- 19 and who is not shedding SARS-CoV-2  virus would expect to have a negative (not detected) result in this assay. Performed At: [facility] [address] [place], [place] [id number] Rush Farmer MD [id number]    [place]  Final    Comment: Performed at [facility], [place] [address] [place], [place] [address]  Blood culture (routine x 2)     Status: None (Preliminary result)   Collection Time: 02/02/20  4:22 PM   Specimen: BLOOD  Result Value Ref Range Status   Specimen Description BLOOD LEFT ANTECUBITAL  Final   Special Requests   Final    BOTTLES DRAWN AEROBIC AND ANAEROBIC Blood Culture adequate volume   Culture   Final    NO GROWTH 4 DAYS Performed at [facility], [place] [address] [place], [place] [address]    Report Status PENDING  Incomplete  Blood culture (routine x 2)     Status: None (Preliminary result)   Collection Time: 02/02/20  4:22 PM   Specimen: BLOOD  Result Value Ref Range Status   Specimen Description BLOOD RIGHT ANTECUBITAL  Final   Special Requests   Final    BOTTLES DRAWN AEROBIC AND ANAEROBIC Blood Culture results may not be optimal due to an excessive volume of blood received in culture bottles   Culture   Final    NO GROWTH 4 DAYS Performed at [facility], [place] [address] [place], [place] [address]    Report Status PENDING  Incomplete  Respiratory Panel by RT PCR (Flu A&B, Covid) - Nasopharyngeal Swab     Status: None   Collection Time: 02/02/20  6:22 PM   Specimen: Nasopharyngeal Swab  Result Value Ref Range Status   SARS Coronavirus 2 by RT PCR NEGATIVE NEGATIVE Final    Comment: (NOTE) SARS-CoV-2 target nucleic acids are NOT DETECTED. The SARS-CoV-2 RNA is generally detectable in upper respiratoy specimens during the acute phase of infection. The lowest concentration of SARS-CoV-2 viral copies this assay can detect is 131 copies/mL. A negative result does not preclude SARS-Cov-2 infection and should not be used as the sole basis for treatment or other patient management decisions. A negative result may occur with  improper specimen collection/handling, submission of specimen other than nasopharyngeal swab, presence of viral  mutation(s) within the areas targeted by this assay, and inadequate number of viral copies (<131 copies/mL). A negative result must be combined with clinical observations, patient history, and epidemiological information. The expected result is Negative. Fact Sheet for Patients:  PinkCheek.be Fact Sheet for Healthcare Providers:  GravelBags.it This test is not yet ap proved or cleared by the [place] FDA and  has been authorized for detection and/or diagnosis of SARS-CoV-2 by  FDA under an Emergency Use Authorization (EUA). This EUA will remain  in effect (meaning this test can be used) for the duration of the COVID-19 declaration under Section 564(b)(1) of the Act, 21 U.S.C. section 360bbb-3(b)(1), unless the authorization is terminated or revoked sooner.    Influenza A by PCR NEGATIVE NEGATIVE Final   Influenza B by PCR NEGATIVE NEGATIVE Final    Comment: (NOTE) The Xpert Xpress SARS-CoV-2/FLU/RSV assay is intended as an aid in  the diagnosis of influenza from Nasopharyngeal swab specimens and  should not be used as a sole basis for treatment. Nasal washings and  aspirates are unacceptable for Xpert Xpress SARS-CoV-2/FLU/RSV  testing. Fact Sheet for Patients: PinkCheek.be Fact Sheet for Healthcare Providers: GravelBags.it This test is not yet approved or cleared by the [place] FDA and  has been authorized for detection and/or diagnosis of SARS-CoV-2 by  FDA under an Emergency Use Authorization (EUA). This EUA will remain  in effect (meaning this test can be used) for the duration of the  Covid-19 declaration under Section 564(b)(1) of the Act, 21  U.S.C. section 360bbb-3(b)(1), unless the authorization is  terminated or revoked. Performed at [facility], [place] [address] [place], [place] [id number]       Studies: IR INT EXT BILIARY DRAIN WITH  CHOLANGIOGRAM  Result Date: 02/06/2020 INDICATION: Biliary obstruction. MRCP demonstrates proximal CBD lesion. Failed ERCP. EXAM: PERCUTANEOUS TRANSHEPATIC CHOLANGIOGRAM PERCUTANEOUS INTERNAL/EXTERNAL BILIARY DRAIN CATHETER PLACEMENT MEDICATIONS: Patient was already receiving adequate prophylactic antibiotic coverage as an inpatient. ANESTHESIA/SEDATION: Intravenous Fentanyl 146mcg and Versed 2mg  were administered as conscious sedation during continuous monitoring of the patient's level of consciousness and physiological / cardiorespiratory status by the radiology RN, with a total moderate sedation time of 38 minutes. PROCEDURE: Informed written consent was obtained from the patient after a thorough discussion of the procedural risks, benefits and alternatives. All questions were addressed. Maximal Sterile Barrier Technique was utilized including caps, mask, sterile gowns, sterile gloves, sterile drape, hand hygiene and skin antiseptic. A timeout was performed prior to the initiation of the procedure. Dilated intrahepatic biliary tree was identified on ultrasound and a peripheral left lobe duct was selected for approach. Skin and subcutaneous tissues down to the liver capsule infiltrated with 1% lidocaine. Under real-time ultrasound guidance, a 21 gauge micropuncture needle was advanced into the duct. Small contrast injection under fluoroscopy confirmed appropriate positioning. A 018 guidewire would not advance. For this reason, percutaneous cholangiogram was performed through the needle. Proximal CBD high-grade stenosis was confirmed. A peripheral left bile duct was identified in from a more advantageous angle, 21 gauge trocar needle advanced into the duct. 018 guidewire advanced centrally. A 5 French micropuncture dilator was placed. Catheter exchanged over an angled Glidewire for a 5 [place] Kumpe catheter, advanced to the distal CBD for additional cholangiographic images. The Kumpe the was then advanced into the  distal duodenum and exchanged over an Amplatz wire for vascular dilator which facilitated placement of a 7 French long vascular sheath. Through this, brush biopsy x2 the proximal CBD lesion was obtained. Subsequently, the sheath was exchanged over Amplatz wire for 8.5 French internal/external biliary drain catheter, placed with sideholes spanning the CBD lesion, and holes in the distal duodenum. Confirmatory contrast injection under fluoroscopy performed. The catheter was then flushed with saline and secured externally with 0 Prolene suture and StatLock, placed to external drain bag to maximize biliary decompression. The patient tolerated the procedure well. FLUOROSCOPY TIME:   COMPLICATIONS: None immediate. FINDINGS: The percutaneous  transhepatic cholangiogram confirms mildly dilated intrahepatic biliary tree with a tapered somewhat irregular short-segment narrowing in the proximal common duct just distal to the confluence. Brush biopsies of the stenotic region were obtained x2. 8.5 French internal/external biliary drainage catheter placed as above. IMPRESSION: 1. Proximal CBD obstructing lesion with intrahepatic biliary ductal dilatation. 2. Brush biopsy of proximal CBD lesion. 3. Technically successful internal/external biliary drain catheter placement. Electronically Signed   By: Lucrezia Europe M.D.   On: 02/06/2020 13:49   IR ENDOLUMINAL BX OF BILIARY TREE  Result Date: 02/06/2020 INDICATION: Biliary obstruction. MRCP demonstrates proximal CBD lesion. Failed ERCP. EXAM: PERCUTANEOUS TRANSHEPATIC CHOLANGIOGRAM PERCUTANEOUS INTERNAL/EXTERNAL BILIARY DRAIN CATHETER PLACEMENT MEDICATIONS: Patient was already receiving adequate prophylactic antibiotic coverage as an inpatient. ANESTHESIA/SEDATION: Intravenous Fentanyl 145mcg and Versed 2mg  were administered as conscious sedation during continuous monitoring of the patient's level of consciousness and physiological / cardiorespiratory status by the radiology RN, with  a total moderate sedation time of 38 minutes. PROCEDURE: Informed written consent was obtained from the patient after a thorough discussion of the procedural risks, benefits and alternatives. All questions were addressed. Maximal Sterile Barrier Technique was utilized including caps, mask, sterile gowns, sterile gloves, sterile drape, hand hygiene and skin antiseptic. A timeout was performed prior to the initiation of the procedure. Dilated intrahepatic biliary tree was identified on ultrasound and a peripheral left lobe duct was selected for approach. Skin and subcutaneous tissues down to the liver capsule infiltrated with 1% lidocaine. Under real-time ultrasound guidance, a 21 gauge micropuncture needle was advanced into the duct. Small contrast injection under fluoroscopy confirmed appropriate positioning. A 018 guidewire would not advance. For this reason, percutaneous cholangiogram was performed through the needle. Proximal CBD high-grade stenosis was confirmed. A peripheral left bile duct was identified in from a more advantageous angle, 21 gauge trocar needle advanced into the duct. 018 guidewire advanced centrally. A 5 French micropuncture dilator was placed. Catheter exchanged over an angled Glidewire for a 5 [place] Kumpe catheter, advanced to the distal CBD for additional cholangiographic images. The Kumpe the was then advanced into the distal duodenum and exchanged over an Amplatz wire for vascular dilator which facilitated placement of a 7 French long vascular sheath. Through this, brush biopsy x2 the proximal CBD lesion was obtained. Subsequently, the sheath was exchanged over Amplatz wire for 8.5 French internal/external biliary drain catheter, placed with sideholes spanning the CBD lesion, and holes in the distal duodenum. Confirmatory contrast injection under fluoroscopy performed. The catheter was then flushed with saline and secured externally with 0 Prolene suture and StatLock, placed to external  drain bag to maximize biliary decompression. The patient tolerated the procedure well. FLUOROSCOPY TIME:  COMPLICATIONS: None immediate. FINDINGS: The percutaneous transhepatic cholangiogram confirms mildly dilated intrahepatic biliary tree with a tapered somewhat irregular short-segment narrowing in the proximal common duct just distal to the confluence. Brush biopsies of the stenotic region were obtained x2. 8.5 French internal/external biliary drainage catheter placed as above. IMPRESSION: 1. Proximal CBD obstructing lesion with intrahepatic biliary ductal dilatation. 2. Brush biopsy of proximal CBD lesion. 3. Technically successful internal/external biliary drain catheter placement. Electronically Signed   By: Lucrezia Europe M.D.   On: 02/06/2020 13:49    Scheduled Meds: . fentaNYL      . fentaNYL      . folic acid  1 mg Oral Daily  . levothyroxine  67.5 mcg Intravenous Daily  . lidocaine      . midazolam      . multivitamin  with minerals  1 tablet Oral Daily  . sodium chloride flush  3 mL Intravenous Once  . thiamine  100 mg Oral Daily   Or  . thiamine  100 mg Intravenous Daily    Continuous Infusions: . sodium chloride 100 mL/hr at 02/06/20 1520  . ampicillin-sulbactam (UNASYN) IV 1.5 g (02/06/20 1519)     LOS: 4 days     Alma Friendly, MD [facility]  If 7PM-7AM, please contact night-coverage [email or web address] 02/06/2020, 5:31 PM

## 2020-02-06 NOTE — Evaluation (Addendum)
Physical Therapy Evaluation Patient Details Name: Rodney Romero MRN: [id number] DOB: 07/28/1946 Today's Date: 02/06/2020   History of Present Illness  Pt is a 74 y/o with PMH of HTN, bipolar disorder, alcohol abuse presenting to ER due to abdominal pain x 2 days. S/P ERCP 02/05/20, workup for Acute cholecystitis versus cholangiocarcinoma versus Mirizzi syndrome.  Clinical Impression   Pt presents with generalized weakness, mild abdominal pain, mild unsteadiness in standing, and decreased activity tolerance. Pt to benefit from acute PT to address deficits. Pt ambulated good hallway distance with light steady assist from use of IV pole, pt accepted gait challenges well. Workup still pending, PT to continue to follow acutely throughout hospital stay. Presently, no follow up PT is warranted. PT to progress mobility as tolerated.     Follow Up Recommendations No PT follow up;Supervision for mobility/OOB    Equipment Recommendations  None recommended by PT    Recommendations for Other Services       Precautions / Restrictions Precautions Precautions: Fall Restrictions Weight Bearing Restrictions: No      Mobility  Bed Mobility Overal bed mobility: Modified Independent             General bed mobility comments: per OT note, pt up in chair upon PT arrival and requests stay in chair upon PT exit  Transfers Overall transfer level: Needs assistance Equipment used: None Transfers: Sit to/from Stand Sit to Stand: Supervision         General transfer comment: supervision for safety, pt reaching for IV pole to self-steady upon standing.  Ambulation/Gait Ambulation/Gait assistance: Supervision Gait Distance (Feet): 225 Feet Assistive device: IV Pole Gait Pattern/deviations: Step-through pattern;Decreased stride length Gait velocity: decr, suspect due to abd discomfort   General Gait Details: Supervision for safety, closer guard when challenging pt gait. Verbal cuing for IV  pole management, pt with slow and steady gait and tolerates challenges to gait (marching, backwards gait, fast/slow gait)  Stairs            Wheelchair Mobility    Modified Rankin (Stroke Patients Only)       Balance Overall balance assessment: Mild deficits observed, not formally tested(mild unsteadiness dynamically) Sitting-balance support: No upper extremity supported;Feet supported Sitting balance-Leahy Scale: Good     Standing balance support: Single extremity supported Standing balance-Leahy Scale: Fair Standing balance comment: can stand without UE support, requires use of IV pole to steady during gait                             Pertinent Vitals/Pain Pain Assessment: Faces Faces Pain Scale: Hurts a little bit Pain Location: stomach (R) Pain Descriptors / Indicators: Discomfort Pain Intervention(s): Limited activity within patient's tolerance;Monitored during session;Repositioned    Home Living Family/patient expects to be discharged to:: Private residence Living Arrangements: Spouse/significant other Available Help at Discharge: Family;Available 24 hours/day Type of Home: House Home Access: Stairs to enter Entrance Stairs-Rails: Can reach both Entrance Stairs-Number of Steps: 3 Home Layout: One level Home Equipment: Walker - 2 wheels;Cane - single point      Prior Function Level of Independence: Independent         Comments: driving, independent (takes baths in tub), 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   Dominant Hand: Right    Extremity/Trunk Assessment   Upper Extremity Assessment Upper Extremity Assessment: Defer to OT evaluation    Lower Extremity Assessment Lower Extremity Assessment:  Generalized weakness    Cervical / Trunk Assessment Cervical / Trunk Assessment: Normal  Communication   Communication: No difficulties(raspy voice)  Cognition Arousal/Alertness: Awake/alert Behavior During Therapy:  WFL for tasks assessed/performed Overall Cognitive Status: Within Functional Limits for tasks assessed                                 General Comments: pt softspoken, raspy voice      General Comments General comments (skin integrity, edema, etc.): vss, HR 65 bpm post-ambulation    Exercises     Assessment/Plan    PT Assessment Patient needs continued PT services  PT Problem List Decreased strength;Decreased mobility;Decreased activity tolerance;Decreased balance       PT Treatment Interventions Therapeutic activities;Gait training;Stair training;Functional mobility training;Therapeutic exercise;Patient/family education;Neuromuscular re-education    PT Goals (Current goals can be found in the Care Plan section)  Acute Rehab PT Goals Patient Stated Goal: less pain PT Goal Formulation: With patient Time For Goal Achievement: 02/20/20 Potential to Achieve Goals: Good    Frequency Min 3X/week   Barriers to discharge        Co-evaluation               AM-PAC PT "6 Clicks" Mobility  Outcome Measure Help needed turning from your back to your side while in a flat bed without using bedrails?: None Help needed moving from lying on your back to sitting on the side of a flat bed without using bedrails?: None Help needed moving to and from a bed to a chair (including a wheelchair)?: A Little Help needed standing up from a chair using your arms (e.g., wheelchair or bedside chair)?: None Help needed to walk in hospital room?: A Little Help needed climbing 3-5 steps with a railing? : A Little 6 Click Score: 21    End of Session Equipment Utilized During Treatment: Gait belt Activity Tolerance: Patient tolerated treatment well Patient left: in chair;with call bell/phone within reach(chair alarm pad placed, not activated due to posey box not present. RN notified, pt verbalizes he will not mobilize without assist) Nurse Communication: Mobility status PT Visit  Diagnosis: Other abnormalities of gait and mobility (R26.89);Muscle weakness (generalized) (M62.81)    Time: 1010-1015, [id number] PT Time Calculation (min) (ACUTE ONLY): 14 min   Charges:   PT Evaluation $PT Eval Low Complexity: 1 Low         Roddrick Sharron E, PT Acute Rehabilitation Services Pager [phone]  Office [phone]  Durant Scibilia D Elonda Husky 02/06/2020, 11:55 AM

## 2020-02-06 NOTE — Progress Notes (Signed)
[facility] Progress Note  1 Day Post-Op  Subjective: Patient reports some upper abdominal pain but denies nausea. +flatus. Plan for PTC today.   Review of Systems  Constitutional: Negative for chills and fever.  Respiratory: Negative for shortness of breath.   Cardiovascular: Negative for chest pain.  Gastrointestinal: Positive for abdominal pain. Negative for nausea and vomiting.  Genitourinary: Negative for dysuria, frequency and urgency.     Objective: Vital signs in last 24 hours: Temp:  [97.2 F (36.2 C)-98.7 F (37.1 C)] 98.2 F (36.8 C) (03/05 0432) Pulse Rate:  [47-75] 68 (03/05 1230) Resp:  [12-21] 13 (03/05 1230) BP: (105-180)/(49-94) 180/94 (03/05 1230) SpO2:  [95 %-100 %] 99 % (03/05 1230) Last BM Date: 02/05/20  Intake/Output from previous day: 03/04 0701 - 03/05 0700 In: 2413.7 [I.V.:1750.6; IV Piggyback:663.1] Out: 225 [Urine:225] Intake/Output this shift: Total I/O In: 200 [IV Piggyback:200] Out: 300 [Urine:300]  PE: General: pleasant, WD, WN male who is laying in bed in NAD HEENT:  Sclera are anicteric.  PERRL.  Ears and nose without any masses or lesions.  Mouth is pink and moist Heart: regular, rate, and rhythm.  Normal s1,s2. No obvious murmurs, gallops, or rubs noted.  Palpable radial and pedal pulses bilaterally Lungs: CTAB, no wheezes, rhonchi, or rales noted.  Respiratory effort nonlabored Abd: soft, mildly ttp in RUQ without peritonitis, mild voluntary guarding, ND, +BS MS: all 4 extremities are symmetrical with no cyanosis, clubbing, or edema. Skin: warm and dry with no masses, lesions, or rashes Neuro: Cranial nerves 2-12 grossly intact, sensation grossly intact throughout Psych: A&Ox3 with an appropriate affect.   Lab Results:  Recent Labs    02/05/20 0201 02/06/20 0246  WBC 12.0* 7.9  HGB 12.5* 12.3*  HCT 37.0* 36.2*  PLT 163 236   BMET Recent Labs    02/05/20 0201 02/06/20 0246  NA 136 137  K 3.3* 3.6  CL 103  105  CO2 23 22  GLUCOSE 80 130*  BUN 7* 14  CREATININE 0.83 0.77  CALCIUM 8.4* 8.3*   PT/INR Recent Labs    02/04/20 0808  LABPROT 14.6  INR 1.2   CMP     Component Value Date/Time   NA 137 02/06/2020 0246   K 3.6 02/06/2020 0246   CL 105 02/06/2020 0246   CO2 22 02/06/2020 0246   GLUCOSE 130 (H) 02/06/2020 0246   BUN 14 02/06/2020 0246   CREATININE 0.77 02/06/2020 0246   CALCIUM 8.3 (L) 02/06/2020 0246   PROT 5.9 (L) 02/06/2020 0246   ALBUMIN 2.5 (L) 02/06/2020 0246   AST 13 (L) 02/06/2020 0246   ALT 27 02/06/2020 0246   ALKPHOS 157 (H) 02/06/2020 0246   BILITOT 1.7 (H) 02/06/2020 0246   GFRNONAA >60 02/06/2020 0246   GFRAA >60 02/06/2020 0246   Lipase     Component Value Date/Time   LIPASE 18 02/04/2020 0208       Studies/Results: DG C-Arm 1-60 Min-No Report  Result Date: 02/05/2020 Fluoroscopy was utilized by the requesting physician.  No radiographic interpretation.    Anti-infectives: Anti-infectives (From admission, onward)   Start     Dose/Rate Route Frequency Ordered Stop   02/03/20 1500  ampicillin-sulbactam (UNASYN) 1.5 g in sodium chloride 0.9 % 100 mL IVPB     1.5 g 200 mL/hr over 30 Minutes Intravenous Every 6 hours 02/03/20 1409     02/02/20 2200  piperacillin-tazobactam (ZOSYN) IVPB 3.375 g  Status:  Discontinued     3.375  g 12.5 mL/hr over 240 Minutes Intravenous Every 8 hours 02/02/20 2134 02/03/20 1415   02/02/20 1645  piperacillin-tazobactam (ZOSYN) IVPB 3.375 g     3.375 g 100 mL/hr over 30 Minutes Intravenous  Once 02/02/20 1635 02/02/20 1817       Assessment/Plan HTN Hypothyroidism PTSD Bipolar Disorder  Transaminitis with hyperbilirubinemia  Possible cholecystitis 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 MRCP with extrahepatic CHD interruption, possibly external compression from gallbladder vs malignancy (cholangiocarcinoma).Despite  downtrending of Tbili to 1.7 today, with elevated tumor markers ofCa 19-9(46) andCEA (11.3) in the setting of recent 5-10 pound weight loss, suspicion for malignancy remains high. - If this is a cholangiocarcinoma, surgical management based on the location on MRCP would be an en bloc extrahepatic bile duct resection woth regional lymphadenectomy and roux-en-y hepaticojejunostomy. If malignancy can be reasonably ruled out with PET scan and staging laparoscopy if endoscopic brushings and IDUS are negative, would recommend cholecystectomy - given amount of inflammation, he is at higher risk for conversion to open cholecystectomy  - s/p ERCP 3/4 with biliary sphincterotomy - IR consulted for PTC which is planned for today - still trying to rule out malignancy, per GI possibly repeat MRCP over the weekend   FEN: NPO, IVF VTE: SCDs ID: Zosyn 3/1>3/2; Unasyn 3/2>>  LOS: 4 days    Brigid Re , [facility] 02/06/2020, 12:31 PM Please see Amion for pager number during day hours 7:00am-4:30pm

## 2020-02-06 NOTE — Progress Notes (Signed)
(  No charge--patient not seen)  Patient was off the floor for his PTC when I came by earlier today.  Cholangiogram findings noted.  We will plan to follow the patient at a distance.  Please call us at any time if input from [place] would be helpful.  For example, the patient may be a candidate for a combined procedure, for placement of a biliary endoprosthesis, at the discretion of interventional radiology.  Cleotis Nipper, M.D. Pager [phone] If no answer or after 5 PM call [phone]

## 2020-02-06 NOTE — Progress Notes (Signed)
Patient to be seen by Dr. Jacinto Reap.  and PA later today however labs are improved and case discussed with surgery team and IR consult noted and I talked to Dr. Earleen Newport from IR and I do not think a PTC is warranted at this time and I do think an ERCP could be done successfully with a second try and we did have biliary drainage from our sphincterotomy but were just not able to cannulate the CBD yesterday but do believe it is highly likely to be successful on a retry if needed but since he continues to improve on antibiotics I think repeating MRCP and MRI on Sunday and if bile duct lesion resolved probably a lap chole at that point with hopefully Intra-Op cholangiogram may solve his problem

## 2020-02-06 NOTE — Procedures (Signed)
  Procedure: Perc transhepatic cholangiogram   Perc brush bx and biliary drainage 8.27F EBL:   minimal Complications:  none immediate  See full dictation in [facility].  Dillard Cannon MD Main # [phone] Pager  [phone]

## 2020-02-06 NOTE — Progress Notes (Signed)
Chief Complaint: Patient was seen in consultation today for biliary obstruction  Referring Physician(s): Dr. Reather Laurence  Supervising Physician: Arne Cleveland  Patient Status: [facility] - Out-pt  History of Present Illness: Rodney Romero is a 74 y.o. male with past medical history of HTN, bipolar disorder, alcohol use who presents to [facility] ED with abdominal pain, dark urine.  Initial work-up included evaluation for acalculous cholecystitis, cholangitis, possible cholangiocarcinoma.  He does have elevated Ca 19-9 46 and CEA 11.3. He did undergo ERCP yesterday with Dr. Watt Climes however the ampulla could not be cannulated.  Surgery also following for consideration of cholecystectomy, however due to concern for possible cholangiocarcinoma surgery deferred in favor of less invasive procedure.  IR consulted for internal/external biliary drain placement.  Case reviewed and approved by Dr. Damita Dunnings.   Patient assessed at bedside today.  He reports he is feeling better.  His abdominal pain has improved, but he is still tender.  Not really moving bowels well.  Tbili improved to 1.7 today.  He is agreeable to drain placement.    Past Medical History:  Diagnosis Date  . Bipolar 1 disorder ([place])   . Hypertension   . Hypothyroidism   . PTSD (post-traumatic stress disorder)     Past Surgical History:  Procedure Laterality Date  . ERCP N/A 02/05/2020   Procedure: ENDOSCOPIC RETROGRADE CHOLANGIOPANCREATOGRAPHY (ERCP);  Surgeon: Clarene Essex, MD;  Location: [place];  Service: Endoscopy;  Laterality: N/A;  . GALLBLADDER SURGERY    . SPHINCTEROTOMY  02/05/2020   Procedure: SPHINCTEROTOMY;  Surgeon: Clarene Essex, MD;  Location: [facility] ENDOSCOPY;  Service: Endoscopy;;  . TIBIA IM NAIL INSERTION Left 06/26/2018   Procedure: left tibia intramedullary nail;  Surgeon: Gaynelle Arabian, MD;  Location: [facility] ORS;  Service: Orthopedics;  Laterality: Left;    Allergies: Patient has no known  allergies.  Medications: Prior to Admission medications   Medication Sig Start Date End Date Taking? Authorizing Provider  amLODipine (NORVASC) 10 MG tablet Take 10 mg by mouth daily.   Yes [provider]  aspirin EC 81 MG tablet Take 81 mg by mouth daily.   Yes [provider]  fluticasone (FLONASE) 50 MCG/ACT nasal spray Place 1 spray into both nostrils daily.   Yes [provider]  galantamine (RAZADYNE ER) 8 MG 24 hr capsule Take 8 mg by mouth daily with breakfast.   Yes [provider]  levothyroxine (SYNTHROID, LEVOTHROID) 137 MCG tablet Take 137 mcg by mouth daily before breakfast.   Yes [provider]  loratadine (CLARITIN) 10 MG tablet Take 10 mg by mouth daily.   Yes [provider]  omeprazole (PRILOSEC) 20 MG capsule Take 20 mg by mouth daily.   Yes [provider]  enoxaparin (LOVENOX) 40 MG/0.4ML injection Inject 0.4 mLs (40 mg total) into the skin daily. Inject one syringe of Lovenox once a day for 10 days following surgery. Then resume one baby aspirin once a day. Patient not taking: Reported on 02/03/2020 06/28/18   Edmisten, Ok Anis, PA  methocarbamol (ROBAXIN) 500 MG tablet Take 1 tablet (500 mg total) by mouth every 6 (six) hours as needed for muscle spasms. Patient not taking: Reported on 02/03/2020 06/28/18   Edmisten, Drue Dun L, PA  mirtazapine (REMERON) 15 MG tablet Take 15 mg by mouth at bedtime.    [provider]  oxyCODONE (OXY IR/ROXICODONE) 5 MG immediate release tablet Take 1-2 tablets (5-10 mg total) by mouth every 6 (six) hours as needed for  moderate pain or severe pain. Patient not taking: Reported on 02/03/2020 06/28/18   Derl Barrow, PA  traZODone (DESYREL) 50 MG tablet Take 50 mg by mouth at bedtime as needed for sleep.    [provider]     Family History  Problem Relation Age of Onset  . Hypertension Mother   . Diabetes Mother   . Seizures Father     Social History    Socioeconomic History  . Marital status: Married    Spouse name: Not on file  . Number of children: Not on file  . Years of education: Not on file  . Highest education level: Not on file  Occupational History  . Not on file  Tobacco Use  . Smoking status: Current Every Day Smoker    Packs/day: 0.50    Types: Cigarettes    Last attempt to quit: 09/03/2014    Years since quitting: 5.4  . Smokeless tobacco: Never Used  Substance and Sexual Activity  . Alcohol use: Yes    Comment: Everyother day   . Drug use: No  . Sexual activity: Not Currently    Birth control/protection: None  Other Topics Concern  . Not on file  Social History Narrative  . Not on file   Social Determinants of Health   Financial Resource Strain:   . Difficulty of Paying Living Expenses: Not on file  Food Insecurity:   . Worried About Charity fundraiser in the Last Year: Not on file  . Ran Out of Food in the Last Year: Not on file  Transportation Needs:   . Lack of Transportation (Medical): Not on file  . Lack of Transportation (Non-Medical): Not on file  Physical Activity:   . Days of Exercise per Week: Not on file  . Minutes of Exercise per Session: Not on file  Stress:   . Feeling of Stress : Not on file  Social Connections:   . Frequency of Communication with Friends and Family: Not on file  . Frequency of Social Gatherings with Friends and Family: Not on file  . Attends Religious Services: Not on file  . Active Member of Clubs or Organizations: Not on file  . Attends Archivist Meetings: Not on file  . Marital Status: Not on file     Review of Systems: A 12 point ROS discussed and pertinent positives are indicated in the HPI above.  All other systems are negative.  Review of Systems  Constitutional: Negative for fatigue and fever.  Respiratory: Negative for cough and shortness of breath.   Cardiovascular: Negative for chest pain.  Gastrointestinal: Positive for abdominal pain  and nausea. Negative for vomiting.  Genitourinary: Negative for dysuria.  Musculoskeletal: Negative for back pain.  Neurological: Negative for dizziness and headaches.  Psychiatric/Behavioral: Negative for behavioral problems and confusion.    Vital Signs: BP 136/76 (BP Location: Left Arm)   Pulse (!) 57   Temp 98.2 F (36.8 C) (Oral)   Resp 15   Ht 5' 7"  (1.702 m)   Wt 123 lb 0.3 oz (55.8 kg)   SpO2 97%   BMI 19.27 kg/m   Physical Exam Vitals and nursing note reviewed.  Constitutional:      General: He is not in acute distress.    Appearance: He is well-developed. He is not ill-appearing.  Cardiovascular:     Rate and Rhythm: Normal rate and regular rhythm.     Heart sounds: No murmur.  Pulmonary:  Effort: Pulmonary effort is normal. No respiratory distress.     Breath sounds: Normal breath sounds.  Abdominal:     General: There is no distension.     Palpations: Abdomen is soft.     Tenderness: There is abdominal tenderness.  Skin:    General: Skin is warm and dry.  Neurological:     General: No focal deficit present.     Mental Status: He is alert.  Psychiatric:        Mood and Affect: Mood normal.        Behavior: Behavior normal.      MD Evaluation Airway: WNL Heart: WNL Abdomen: WNL Chest/ Lungs: WNL ASA  Classification: 3 Mallampati/Airway Score: One   Imaging: CT ABDOMEN PELVIS W CONTRAST  Result Date: 02/02/2020 CLINICAL DATA:  Clinical suspicion for abdominal abscess or infection. Possible cholangitis or liver infection or gallbladder disease. Patient reports upper abdominal pain since yesterday with 1 episode of vomiting. EXAM: CT ABDOMEN AND PELVIS WITH CONTRAST TECHNIQUE: Multidetector CT imaging of the abdomen and pelvis was performed using the standard protocol following bolus administration of intravenous contrast. Automatic exposure control utilized. CONTRAST:  178m OMNIPAQUE IOHEXOL 300 MG/ML  SOLN COMPARISON:  December 18, 2013.  Ultrasonography performed earlier on the same date. FINDINGS: Lower chest: Normal heart size. Mild coronary and thoracic aorta atherosclerotic calcification. Minimal subpleural atelectasis. Hepatobiliary: Abnormal 4.3 cm gallbladder dilatation with abnormal 9 mm gallbladder wall thickening and moderate amount of paracholecystic free fluid that extends into the right paracolic gutter and pelvis. Mild intrahepatic biliary duct dilatation. Normal 5 mm caliber of the common bile duct with distal tapering. No calcified gallstone. Upper limits of normal hepatic size with normal smooth hepatic contours. A tiny 3 mm hypoattenuation in the medial left hemiliver, likely a benign cyst or hemangioma based on size criteria in a patient without known malignancy; no further imaging is necessary. Pancreas: Normal pancreatic parenchyma and main pancreatic duct. Possible pancreatic divisum, series 3 images 35 and 36. No evidence for acute or chronic pancreatitis. Spleen: Normal splenic enhancement and contours. Small spleen measuring 4.4 x 7.5 by 7.0 cm. Adrenals/Urinary Tract: Normal bilateral adrenal glands and ureters. Focal wedge-shaped scarring with hypoenhancement in both renal upper poles. A multi loculated right renal lower pole cyst with multiple septations and course mural calcification measuring 3.9 by 3.3 by 2.7 cm; increased in size from 2.0 by 2.9 by 2.3 cm in 2015. Stomach/Bowel: Nonobstructed small bowel that is mostly decompressed. Mild fluid-distension and mild mucosal thickening of the duodenal bulb and C-loop, likely a focal ileus and secondary inflammatory change from the adjacent gallbladder fossa. Normal appendix, coronal 32. Paucity of stool. No apparent colorectal wall thickening. Vascular/Lymphatic: Aorto bi-iliac calcified atherosclerosis without and abdominal aortic aneurysm. Reproductive: Prostatomegaly with mass effect on the posterior urinary bladder wall. Focal 7 mm fluid distension of the prostatic  urethra, likely a small urethrocele. Other: Small left direct inguinal herniation of fat without strangulation or incarceration. Musculoskeletal: Mild skeletal degenerative change. IMPRESSION: Redemonstration of an acute cholecystitis with abnormal gallbladder dilatation, wall thickening, and free fluid in the abdomen and pelvis. Ileus pattern in the proximal duodenum and likely secondary mild duodenitis. Mild intrahepatic biliary duct dilatation. Normal 5 mm caliber of the common bile duct with distal tapering. No calcified gallstones or calcified choledocholithiasis. Given the noncalcified stones or sludge on recent ultrasonography, further evaluation with MRCP or ERCP could be considered. Possible pancreatic divisum. No acute pancreatitis. A Bosniak IIF right renal lower pole cyst  increased in size since 2015 measuring up to 3.9 cm. Surveillance ultrasonography recommended in 6-9 months. Thoracoabdominal aortic calcified atherosclerosis. Mild coronary calcification. Prostatomegaly with small urethrocele. Electronically Signed   By: Revonda Humphrey   On: 02/02/2020 18:33   MR ABDOMEN MRCP WO CONTRAST  Result Date: 02/03/2020 CLINICAL DATA:  Cholelithiasis. EXAM: MRI ABDOMEN WITHOUT CONTRAST  (INCLUDING MRCP) TECHNIQUE: Multiplanar multisequence MR imaging of the abdomen was performed. Heavily T2-weighted images of the biliary and pancreatic ducts were obtained, and three-dimensional MRCP images were rendered by post processing. COMPARISON:  CT 02/02/2020 FINDINGS: Lower chest:  Lung bases are clear. Hepatobiliary: Mild intrahepatic biliary duct dilatation. The common hepatic duct and common bile duct are normal caliber. There is a gap between the extrahepatic bile ducts and the mid common hepatic duct measuring 8 mm as seen on MRCP image 08/1029. Also appreciated on image 37/26. There is fullness in the neck of the gallbladder (image 9/21). No filling defect within the common bile duct to suggest  choledocholithiasis. Common bile duct measures upper limits of normal at 6 mm. There is marked gallbladder wall thickening and pericholecystic fluid. Gallbladder wall measures up to 8 mm. No clear stones within the gallbladder. Extensive inflammation fluid surrounding the gallbladder fossa. Fluid extends into Morrison's pouch and along the RIGHT pericolic gutter. Pancreas: Normal pancreatic parenchymal intensity. No ductal dilatation or inflammation. No variant ductal anatomy. Spleen: Normal spleen. Adrenals/urinary tract: Adrenal glands normal. Multilobulated cystic complex in the lower pole RIGHT kidney measures 3.6 cm. Intervening cyst walls are thin. No complexity. Stomach/Bowel: Stomach and limited of the small bowel is unremarkable Vascular/Lymphatic: Abdominal aortic normal caliber. No retroperitoneal periportal lymphadenopathy. Musculoskeletal: No aggressive osseous lesion IMPRESSION: 1. No choledocholithiasis. 2. Severe inflammation surrounding gallbladder fossa with gallbladder wall thickening suggests acute cholecystitis. 3. Interruption of the common hepatic duct at the level of the extrahepatic bile ducts. This may relate to edema within the neck of the gallbladder affecting the common hepatic duct. Conversely findings could represent cholangitis or cholangiocarcinoma secondarily obstructing the neck of the gallbladder. No gallstones present to explain cholecystitis. Minimal intrahepatic duct dilatation currently. ERCP may be warranted to evaluate the common hepatic duct. 4. Mild intrahepatic duct dilatation. These results will be called to the ordering clinician or representative by the Radiologist Assistant, and communication documented in the PACS or zVision Dashboard. Electronically Signed   By: Suzy Bouchard M.D.   On: 02/03/2020 13:38   MR 3D Recon At Scanner  Result Date: 02/03/2020 CLINICAL DATA:  Cholelithiasis. EXAM: MRI ABDOMEN WITHOUT CONTRAST  (INCLUDING MRCP) TECHNIQUE: Multiplanar  multisequence MR imaging of the abdomen was performed. Heavily T2-weighted images of the biliary and pancreatic ducts were obtained, and three-dimensional MRCP images were rendered by post processing. COMPARISON:  CT 02/02/2020 FINDINGS: Lower chest:  Lung bases are clear. Hepatobiliary: Mild intrahepatic biliary duct dilatation. The common hepatic duct and common bile duct are normal caliber. There is a gap between the extrahepatic bile ducts and the mid common hepatic duct measuring 8 mm as seen on MRCP image 08/1029. Also appreciated on image 37/26. There is fullness in the neck of the gallbladder (image 9/21). No filling defect within the common bile duct to suggest choledocholithiasis. Common bile duct measures upper limits of normal at 6 mm. There is marked gallbladder wall thickening and pericholecystic fluid. Gallbladder wall measures up to 8 mm. No clear stones within the gallbladder. Extensive inflammation fluid surrounding the gallbladder fossa. Fluid extends into Morrison's pouch and along the RIGHT pericolic  gutter. Pancreas: Normal pancreatic parenchymal intensity. No ductal dilatation or inflammation. No variant ductal anatomy. Spleen: Normal spleen. Adrenals/urinary tract: Adrenal glands normal. Multilobulated cystic complex in the lower pole RIGHT kidney measures 3.6 cm. Intervening cyst walls are thin. No complexity. Stomach/Bowel: Stomach and limited of the small bowel is unremarkable Vascular/Lymphatic: Abdominal aortic normal caliber. No retroperitoneal periportal lymphadenopathy. Musculoskeletal: No aggressive osseous lesion IMPRESSION: 1. No choledocholithiasis. 2. Severe inflammation surrounding gallbladder fossa with gallbladder wall thickening suggests acute cholecystitis. 3. Interruption of the common hepatic duct at the level of the extrahepatic bile ducts. This may relate to edema within the neck of the gallbladder affecting the common hepatic duct. Conversely findings could represent  cholangitis or cholangiocarcinoma secondarily obstructing the neck of the gallbladder. No gallstones present to explain cholecystitis. Minimal intrahepatic duct dilatation currently. ERCP may be warranted to evaluate the common hepatic duct. 4. Mild intrahepatic duct dilatation. These results will be called to the ordering clinician or representative by the Radiologist Assistant, and communication documented in the PACS or zVision Dashboard. Electronically Signed   By: Suzy Bouchard M.D.   On: 02/03/2020 13:38   US Abdomen Limited  Result Date: 02/02/2020 CLINICAL DATA:  Right upper quadrant pain EXAM: ULTRASOUND ABDOMEN LIMITED RIGHT UPPER QUADRANT COMPARISON:  None. FINDINGS: Gallbladder: Sludge and probable small calculi. Wall thickening measuring 14 mm. There is small volume pericholecystic fluid. Sonographic Percell Miller sign could not be evaluated due to presence of pain medication. Common bile duct: Diameter: 3.1 mm, normal. Liver: No focal lesion identified. Within normal limits in parenchymal echogenicity. Portal vein is patent on color Doppler imaging with normal direction of blood flow towards the liver. Other: None. IMPRESSION: Sonographic evidence of acute cholecystitis. These results were called by telephone at the time of interpretation on 02/02/2020 at 4:27 pm to provider MATTHEW TRIFAN , who verbally acknowledged these results. Electronically Signed   By: Macy Mis M.D.   On: 02/02/2020 16:29   DG Chest Portable 1 View  Result Date: 02/02/2020 CLINICAL DATA:  74 year old male with fever and tachycardia. EXAM: PORTABLE CHEST 1 VIEW COMPARISON:  Chest radiograph dated 06/25/2018. FINDINGS: No focal consolidation, pleural effusion, pneumothorax. The cardiac silhouette is within limits. Atherosclerotic calcification of the aorta. No acute osseous pathology. IMPRESSION: No acute cardiopulmonary process. Electronically Signed   By: Anner Crete M.D.   On: 02/02/2020 19:30   DG C-Arm 1-60  Min-No Report  Result Date: 02/05/2020 Fluoroscopy was utilized by the requesting physician.  No radiographic interpretation.    Labs:  CBC: Recent Labs    02/03/20 0325 02/04/20 0208 02/05/20 0201 02/06/20 0246  WBC 16.3* 14.1* 12.0* 7.9  HGB 13.2 12.4* 12.5* 12.3*  HCT 38.7* 36.3* 37.0* 36.2*  PLT 165 136* 163 236    COAGS: Recent Labs    02/02/20 2143 02/03/20 0325 02/04/20 0808  INR 1.2 1.3* 1.2    BMP: Recent Labs    02/03/20 0325 02/04/20 0208 02/05/20 0201 02/06/20 0246  NA 134* 133* 136 137  K 3.7 3.4* 3.3* 3.6  CL 102 103 103 105  CO2 23 23 23 22   GLUCOSE 96 108* 80 130*  BUN 10 6* 7* 14  CALCIUM 8.6* 8.3* 8.4* 8.3*  CREATININE 1.03 0.84 0.83 0.77  GFRNONAA >60 >60 >60 >60  GFRAA >60 >60 >60 >60    LIVER FUNCTION TESTS: Recent Labs    02/03/20 0325 02/04/20 0208 02/05/20 0201 02/06/20 0246  BILITOT 7.6* 3.7* 3.0* 1.7*  AST 64* 27 19 13*  ALT 84* 52* 38 27  ALKPHOS 213* 180* 162* 157*  PROT 6.4* 6.2* 6.2* 5.9*  ALBUMIN 3.0* 2.7* 2.5* 2.5*    TUMOR MARKERS: No results for input(s): AFPTM, CEA, CA199, CHROMGRNA in the last 8760 hours.  Assessment and Plan: Acute cholecystitis vs. Cholangiocarcinoma vs. Mirizzi syndrom Patient followed by surgery and GI for management of his possible acute cholecystitis and elevated CA 19-9, CEA, GGT.  He underwent ERCP yesterday which was unsuccessful.  IR consulted for PTC with drainage, possible brush biopsies.  Case reviewed and approved by Dr. Earleen Newport.  Patient evaluated at bedside. He reports some improvement in abdominal pain today. His Tbili has improved to 1.7 INR 1.2 3/3 Discussed with surgery who agrees to move forward today.  Discussed with patient who is agreeable to procedure today.  On Unasyn.   Risks and benefits discussed with the patient including, but not limited to bleeding, infection which may lead to sepsis or even death and damage to adjacent structures.  This interventional  procedure involves the use of X-rays and because of the nature of the planned procedure, it is possible that we will have prolonged use of X-ray fluoroscopy.  Potential radiation risks to you include (but are not limited to) the following: - A slightly elevated risk for cancer  several years later in life. This risk is typically less than 0.5% percent. This risk is low in comparison to the normal incidence of human cancer, which is 33% for women and 50% for men according to the [place]. - Radiation induced injury can include skin redness, resembling a rash, tissue breakdown / ulcers and hair loss (which can be temporary or permanent).   The likelihood of either of these occurring depends on the difficulty of the procedure and whether you are sensitive to radiation due to previous procedures, disease, or genetic conditions.   IF your procedure requires a prolonged use of radiation, you will be notified and given written instructions for further action.  It is your responsibility to monitor the irradiated area for the 2 weeks following the procedure and to notify your physician if you are concerned that you have suffered a radiation induced injury.    All of the patient's questions were answered, patient is agreeable to proceed.  Consent signed and in chart.  Thank you for this interesting consult.  I greatly enjoyed meeting Rodney Romero and look forward to participating in their care.  A copy of this report was sent to the requesting provider on this date.  Electronically Signed: Docia Barrier, PA 02/06/2020, 11:10 AM   I spent a total of 40 Minutes    in face to face in clinical consultation, greater than 50% of which was counseling/coordinating care for hyperbilirubinemia, biliary obstruction.

## 2020-02-06 NOTE — Evaluation (Signed)
Occupational Therapy Evaluation Patient Details Name: Rodney Romero MRN: [id number] DOB: May 19, 1946 Today's Date: 02/06/2020    History of Present Illness Pt is a 74 y/o with PMH of HTN, bipolar disorder, alcohol abuse presenting to ER due to abdominal pain x 2 days.  Admitted for further management of acute cholecystitis. S/P ERCP 02/05/20.    Clinical Impression   PTA patient independent and driving. Admitted for above and limited by problem list below, including decreased activity tolerance and mild unsteadiness dynamically.  Patient currently requires supervision for sit to stand transfers, min guard for dynamic balance during LB ADLs and in room mobility.  VSS during session.  He will benefit from continued OT services while admitted to optimize independence and safety with ADLs/mobility, but anticipate no further needs after dc home.      Follow Up Recommendations  No OT follow up;Supervision - Intermittent    Equipment Recommendations  None recommended by OT    Recommendations for Other Services       Precautions / Restrictions Restrictions Weight Bearing Restrictions: No      Mobility Bed Mobility Overal bed mobility: Modified Independent             General bed mobility comments: use of bed rails, increased time but no phyiscal assist required  Transfers Overall transfer level: Needs assistance   Transfers: Sit to/from Stand Sit to Stand: Supervision         General transfer comment: simple sit to stand with close supervision     Balance Overall balance assessment: Mild deficits observed, not formally tested(mild unsteadiness dynamically)                                         ADL either performed or assessed with clinical judgement   ADL Overall ADL's : Needs assistance/impaired     Grooming: Standing;Supervision/safety   Upper Body Bathing: Set up;Sitting   Lower Body Bathing: Min guard;Sit to/from stand   Upper Body  Dressing : Set up;Sitting   Lower Body Dressing: Min guard;Sit to/from stand   Toilet Transfer: Min guard;Ambulation           Functional mobility during ADLs: Min guard General ADL Comments: pt limited by decreased activity tolerance and mild unsteadiness     Vision         Perception     Praxis      Pertinent Vitals/Pain Pain Assessment: Faces Faces Pain Scale: Hurts even more Pain Location: stomach (R) Pain Descriptors / Indicators: Discomfort;Guarding;Grimacing Pain Intervention(s): Monitored during session;Repositioned;Patient requesting pain meds-RN notified     Hand Dominance Right   Extremity/Trunk Assessment Upper Extremity Assessment Upper Extremity Assessment: Overall WFL for tasks assessed(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)   Lower Extremity Assessment Lower Extremity Assessment: Defer to PT evaluation       Communication Communication Communication: No difficulties(raspy voice)   Cognition Arousal/Alertness: Awake/alert Behavior During Therapy: WFL for tasks assessed/performed Overall Cognitive Status: Within Functional Limits for tasks assessed                                     General Comments  VSS    Exercises     Shoulder Instructions      Home Living Family/patient expects to be discharged to:: Private residence Living Arrangements: Spouse/significant other Available  Help at Discharge: Family;Available 24 hours/day Type of Home: House Home Access: Stairs to enter [employer] of Steps: 3 Entrance Stairs-Rails: Can reach both Home Layout: One level     Bathroom Shower/Tub: Teacher, early years/pre: Handicapped height     Home Equipment: Environmental consultant - 2 wheels;Cane - single point          Prior Functioning/Environment Level of Independence: Independent        Comments: driving, independent (takes baths in tub)        OT Problem List: Decreased activity tolerance;Impaired balance  (sitting and/or standing);Pain;Decreased knowledge of precautions;Decreased knowledge of use of DME or AE      OT Treatment/Interventions: Self-care/ADL training;DME and/or AE instruction;Energy conservation;Therapeutic activities;Balance training;Patient/family education    OT Goals(Current goals can be found in the care plan section) Acute Rehab OT Goals Patient Stated Goal: less pain OT Goal Formulation: With patient Time For Goal Achievement: 02/20/20 Potential to Achieve Goals: Good  OT Frequency: Min 2X/week   Barriers to D/C:            Co-evaluation              AM-PAC OT "6 Clicks" Daily Activity     Outcome Measure Help from another person eating meals?: None Help from another person taking care of personal grooming?: A Little Help from another person toileting, which includes using toliet, bedpan, or urinal?: A Little Help from another person bathing (including washing, rinsing, drying)?: A Little Help from another person to put on and taking off regular upper body clothing?: None Help from another person to put on and taking off regular lower body clothing?: A Little 6 Click Score: 20   End of Session Nurse Communication: Mobility status  Activity Tolerance: Patient tolerated treatment well Patient left: in chair;with call bell/phone within reach  OT Visit Diagnosis: Other abnormalities of gait and mobility (R26.89);Pain Pain - Right/Left: Right Pain - part of body: (stomach)                Time: [id number] OT Time Calculation (min): 25 min Charges:  OT General Charges $OT Visit: 1 Visit OT Evaluation $OT Eval Low Complexity: 1 Low OT Treatments $Self Care/Home Management : 8-22 mins  Jolaine Artist, OT Acute Rehabilitation Services Pager [phone] Office [phone]    Delight Stare 02/06/2020, 10:21 AM

## 2020-02-06 NOTE — Progress Notes (Signed)
Pt transfer to radiology for biliary drain placement, NPO midnight.

## 2020-02-06 NOTE — Progress Notes (Signed)
Pt came back from radiology s/p biliary drain placement RUQ no output noted at this time, alert and oriented, no complain of pain at this time.

## 2020-02-07 ENCOUNTER — Inpatient Hospital Stay (HOSPITAL_COMMUNITY): Payer: Medicare Other

## 2020-02-07 DIAGNOSIS — I340 Nonrheumatic mitral (valve) insufficiency: Secondary | ICD-10-CM

## 2020-02-07 DIAGNOSIS — R9431 Abnormal electrocardiogram [ECG] [EKG]: Secondary | ICD-10-CM

## 2020-02-07 LAB — CBC WITH DIFFERENTIAL/PLATELET
Abs Immature Granulocytes: 0.04 10*3/uL (ref 0.00–0.07)
Basophils Absolute: 0 10*3/uL (ref 0.0–0.1)
Basophils Relative: 0 %
Eosinophils Absolute: 0 10*3/uL (ref 0.0–0.5)
Eosinophils Relative: 0 %
HCT: 35.6 % — ABNORMAL LOW (ref 39.0–52.0)
Hemoglobin: 11.8 g/dL — ABNORMAL LOW (ref 13.0–17.0)
Immature Granulocytes: 0 %
Lymphocytes Relative: 19 %
Lymphs Abs: 1.8 10*3/uL (ref 0.7–4.0)
MCH: 23.9 pg — ABNORMAL LOW (ref 26.0–34.0)
MCHC: 33.1 g/dL (ref 30.0–36.0)
MCV: 72.2 fL — ABNORMAL LOW (ref 80.0–100.0)
Monocytes Absolute: 0.5 10*3/uL (ref 0.1–1.0)
Monocytes Relative: 5 %
Neutro Abs: 7 10*3/uL (ref 1.7–7.7)
Neutrophils Relative %: 76 %
Platelets: 263 10*3/uL (ref 150–400)
RBC: 4.93 MIL/uL (ref 4.22–5.81)
RDW: 14 % (ref 11.5–15.5)
WBC: 9.4 10*3/uL (ref 4.0–10.5)
nRBC: 0 % (ref 0.0–0.2)

## 2020-02-07 LAB — COMPREHENSIVE METABOLIC PANEL
ALT: 33 U/L (ref 0–44)
AST: 29 U/L (ref 15–41)
Albumin: 2.7 g/dL — ABNORMAL LOW (ref 3.5–5.0)
Alkaline Phosphatase: 146 U/L — ABNORMAL HIGH (ref 38–126)
Anion gap: 8 (ref 5–15)
BUN: 8 mg/dL (ref 8–23)
CO2: 23 mmol/L (ref 22–32)
Calcium: 8.2 mg/dL — ABNORMAL LOW (ref 8.9–10.3)
Chloride: 107 mmol/L (ref 98–111)
Creatinine, Ser: 0.71 mg/dL (ref 0.61–1.24)
GFR calc Af Amer: >60 mL/min (ref >60)
GFR calc non Af Amer: >60 mL/min (ref >60)
Glucose, Bld: 98 mg/dL (ref 70–99)
Potassium: 3.2 mmol/L — ABNORMAL LOW (ref 3.5–5.1)
Sodium: 138 mmol/L (ref 135–145)
Total Bilirubin: 1.4 mg/dL — ABNORMAL HIGH (ref 0.3–1.2)
Total Protein: 6 g/dL — ABNORMAL LOW (ref 6.5–8.1)

## 2020-02-07 LAB — CULTURE, BLOOD (ROUTINE X 2)
Culture: NO GROWTH
Culture: NO GROWTH
Special Requests: ADEQUATE

## 2020-02-07 LAB — ECHOCARDIOGRAM COMPLETE
Height: 67 in
Weight: 1968.27 oz

## 2020-02-07 LAB — GLUCOSE, CAPILLARY
Glucose-Capillary: 92 mg/dL (ref 70–99)
Glucose-Capillary: 97 mg/dL (ref 70–99)
Glucose-Capillary: 89 mg/dL (ref 70–99)

## 2020-02-07 LAB — TSH: TSH: 1.255 u[IU]/mL (ref 0.350–4.500)

## 2020-02-07 LAB — T4, FREE: Free T4: 1.21 ng/dL — ABNORMAL HIGH (ref 0.61–1.12)

## 2020-02-07 MED ORDER — ENOXAPARIN SODIUM 40 MG/0.4ML ~~LOC~~ SOLN
40.0000 mg | SUBCUTANEOUS | Status: DC
  Administered 2020-02-07 – 2020-02-09 (×3): 40 mg via SUBCUTANEOUS
  Filled 2020-02-07 (×3): qty 0.4

## 2020-02-07 MED ORDER — POTASSIUM CHLORIDE CRYS ER 20 MEQ PO TBCR
40.0000 meq | EXTENDED_RELEASE_TABLET | Freq: Once | ORAL | Status: AC
  Administered 2020-02-07: 14:00:00 40 meq via ORAL
  Filled 2020-02-07: qty 2

## 2020-02-07 NOTE — Progress Notes (Signed)
  Echocardiogram 2D Echocardiogram has been performed.  Rodney Romero 02/07/2020, 2:17 PM

## 2020-02-07 NOTE — Progress Notes (Signed)
PROGRESS NOTE  Rodney Romero [id number] DOB: 01-20-1946 DOA: 02/02/2020 PCP: [facility]  HPI/Recap of past 24 hours: [name] is a 74 y.o. male with history of hypertension hypothyroidism bipolar disorder, alcohol abuse presents to the ER because of abdominal pain.  Patient states he has been abdominal pain mostly in the right upper quadrant for the last 2 days.  Has some subjective feeling of fever chills and diaphoresis.  Denies chest pain shortness of breath.  Denies any vomiting or diarrhea. In the ED, RUQ showed features concerning for cholecystitis. Labs showed AST 176 ALT 152 creatinine 1.8 alkaline phosphatase 313 total bilirubin 9.7.  CBC shows WBC of 14.5 high sensitive troponin was 5 and 4 EKG shows normal sinus rhythm.  General surgery, GI consulted.  Patient admitted for further management.     Today, pt denies any new complains, reports pain around drain. Not hungry. Denies any N/V, fever/chills, chest pain, SOB, palpitations, dizziness.    Assessment/Plan: Principal Problem:   Abdominal pain Active Problems:   Elevated LFTs   Hypertension   Cholecystitis, acute   Acute cholecystitis versus cholangiocarcinoma s/p IR biliary drain placement with cholangiogram on 02/06/20 Currently afebrile, with resolved leukocytosis BC x2, NGTD CA 19-9 46, CEA 11.3, GGT 199 Right upper quadrant ultrasound with possible acute cholecystitis CT abdomen pelvis showed acute cholecystitis GI on board, s/p ERCP on 02/05/2020, unfortunately scope position was very difficult despite multiple maneuvers, not able to cannulate CBD. General surgery on board, recommend percutaneous cholecystostomy by IR S/P IR biliary drain placement with cholangiogram on 02/06/20 which showed proximal CBD obstructing lesion with intrahepatic biliary ductal dilatation. Brush biopsy of proximal CBD lesion pending result Advance to CLD Continue IV Unasyn Monitor  closely  Hyperbilirubinemia T bili currently trended down Continue to trend  Sinus bradycardia Fluctuating Telemetry reported HR in 40s on 02/05/20, checked manually at bedside, HR in 50s, EKG showed sinus brady, HR 50s. Asymptomatic. Bedside continuous monitoring ECHO pending Continue telemetry  Hypokalemia Replace prn  Hypertension Continue IV hydralazine as needed  Hypothyroidism Free T4 1.21,  TSH 1.255 Continue IV Synthroid  Alcohol abuse Counseled against abuse CIWA protocol        Malnutrition Type:      Malnutrition Characteristics:      Nutrition Interventions:       Estimated body mass index is 19.27 kg/m as calculated from the following:   Height as of this encounter: 5\' 7"  (1.702 m).   Weight as of this encounter: 55.8 kg.     Code Status: Full  Family Communication: Discussed with wife at bedside on 02/05/20  Disposition Plan: Possibly Home likely after sign off from IR, surgery   Consultants:  GI  General surgery  IR  Procedures:  ERCP on 02/05/20  IR biliary drain placement on 02/06/20  Antimicrobials:  Unasyn  DVT prophylaxis: Lovenox   Objective: Vitals:   02/06/20 2119 02/07/20 0501 02/07/20 0503 02/07/20 1329  BP: (!) 146/76 129/77  (!) 148/73  Pulse: (!) 51 (!) 59 68 (!) 49  Resp: 16 16  18   Temp: 98.4 F (36.9 C) 97.8 F (36.6 C)  98.4 F (36.9 C)  TempSrc: Oral Oral  Oral  SpO2: 97% 97% 97% 98%  Weight:      Height:        Intake/Output Summary (Last 24 hours) at 02/07/2020 1413 Last data filed at 02/07/2020 1045 Gross per 24 hour  Intake 1742.17 ml  Output  2125 ml  Net -382.83 ml   Filed Weights   02/02/20 1146 02/02/20 2102 02/05/20 1019  Weight: 61.7 kg 55.8 kg 55.8 kg    Exam:  General: NAD   Cardiovascular: S1, S2 present  Respiratory: CTAB  Abdomen: Soft, +tender around biliary drain, nondistended, bowel sounds present  Musculoskeletal: No bilateral pedal edema noted  Skin:  Normal  Psychiatry: Normal mood   Data Reviewed: CBC: Recent Labs  Lab 02/03/20 0325 02/04/20 0208 02/05/20 0201 02/06/20 0246 02/07/20 0327  WBC 16.3* 14.1* 12.0* 7.9 9.4  NEUTROABS 13.5* 11.9* 9.7* 6.5 7.0  HGB 13.2 12.4* 12.5* 12.3* 11.8*  HCT 38.7* 36.3* 37.0* 36.2* 35.6*  MCV 71.0* 71.7* 71.6* 71.7* 72.2*  PLT 165 136* 163 236 [id number]   Basic Metabolic Panel: Recent Labs  Lab 02/02/20 1150 02/02/20 1935 02/03/20 0325 02/04/20 0208 02/05/20 0201 02/06/20 0246 02/07/20 0327  NA   < >  --  134* 133* 136 137 138  K   < >  --  3.7 3.4* 3.3* 3.6 3.2*  CL   < >  --  102 103 103 105 107  CO2   < >  --  23 23 23 22 23   GLUCOSE   < >  --  96 108* 80 130* 98  BUN   < >  --  10 6* 7* 14 8  CREATININE   < >  --  1.03 0.84 0.83 0.77 0.71  CALCIUM   < >  --  8.6* 8.3* 8.4* 8.3* 8.2*  MG  --  1.8  --   --   --   --   --    < > = values in this interval not displayed.   GFR: Estimated Creatinine Clearance: 65.9 mL/min (by C-G formula based on SCr of 0.71 mg/dL). Liver Function Tests: Recent Labs  Lab 02/03/20 0325 02/04/20 0208 02/05/20 0201 02/06/20 0246 02/07/20 0327  AST 64* 27 19 13* 29  ALT 84* 52* 38 27 33  ALKPHOS 213* 180* 162* 157* 146*  BILITOT 7.6* 3.7* 3.0* 1.7* 1.4*  PROT 6.4* 6.2* 6.2* 5.9* 6.0*  ALBUMIN 3.0* 2.7* 2.5* 2.5* 2.7*   Recent Labs  Lab 02/02/20 1150 02/04/20 0208  LIPASE 21 18   No results for input(s): AMMONIA in the last 168 hours. Coagulation Profile: Recent Labs  Lab 02/02/20 2143 02/03/20 0325 02/04/20 0808  INR 1.2 1.3* 1.2   Cardiac Enzymes: No results for input(s): CKTOTAL, CKMB, CKMBINDEX, TROPONINI in the last 168 hours. BNP (last 3 results) No results for input(s): PROBNP in the last 8760 hours. HbA1C: No results for input(s): HGBA1C in the last 72 hours. CBG: Recent Labs  Lab 02/05/20 2347 02/06/20 0818 02/06/20 1734 02/06/20 2357 02/07/20 0715  GLUCAP 210* 111* 110* 104* 92   Lipid Profile: No results  for input(s): CHOL, HDL, LDLCALC, TRIG, CHOLHDL, LDLDIRECT in the last 72 hours. Thyroid Function Tests: Recent Labs    02/07/20 0327  TSH 1.255  FREET4 1.21*   Anemia Panel: No results for input(s): VITAMINB12, FOLATE, FERRITIN, TIBC, IRON, RETICCTPCT in the last 72 hours. Urine analysis:    Component Value Date/Time   COLORURINE AMBER (A) 02/02/2020 1830   APPEARANCEUR CLEAR 02/02/2020 1830   LABSPEC >1.046 (H) 02/02/2020 1830   PHURINE 6.0 02/02/2020 1830   GLUCOSEU NEGATIVE 02/02/2020 1830   HGBUR SMALL (A) 02/02/2020 1830   BILIRUBINUR SMALL (A) 02/02/2020 [place] 02/02/2020 [place] NEGATIVE 02/02/2020 1830  UROBILINOGEN 1.0 09/04/2015 2309   NITRITE NEGATIVE 02/02/2020 1830   LEUKOCYTESUR NEGATIVE 02/02/2020 1830   Sepsis Labs: @LABRCNTIP (procalcitonin:4,lacticidven:4)  ) Recent Results (from the past 240 hour(s))  Novel Coronavirus, NAA (Hosp order, Send-out to Ref Lab; TAT 18-24 hrs     Status: None   Collection Time: 02/02/20 11:04 AM   Specimen: Nasopharyngeal Swab; Respiratory  Result Value Ref Range Status   SARS-CoV-2, NAA NOT DETECTED NOT DETECTED Final    Comment: (NOTE) This nucleic acid amplification test was developed and its performance characteristics determined by [employer]. Nucleic acid amplification tests include RT-PCR and TMA. This test has not been FDA cleared or approved. This test has been authorized by FDA under an Emergency Use Authorization (EUA). This test is only authorized for the duration of time the declaration that circumstances exist justifying the authorization of the emergency use of in vitro diagnostic tests for detection of SARS-CoV-2 virus and/or diagnosis of COVID-19 infection under section 564(b)(1) of the Act, 21 U.S.C. [id number]) (1), unless the authorization is terminated or revoked sooner. When diagnostic testing is negative, the possibility of a false negative result should be  considered in the context of a patient's recent exposures and the presence of clinical signs and symptoms consistent with COVID-19. An individual without symptoms of COVID- 19 and who is not shedding SARS-CoV-2  virus would expect to have a negative (not detected) result in this assay. Performed At: [facility] [address] [place], [place] [id number] Rush Farmer MD [id number]    [place]  Final    Comment: Performed at [facility], [place] [address], [place], [place] [address]  Blood culture (routine x 2)     Status: None   Collection Time: 02/02/20  4:22 PM   Specimen: BLOOD  Result Value Ref Range Status   Specimen Description BLOOD LEFT ANTECUBITAL  Final   Special Requests   Final    BOTTLES DRAWN AEROBIC AND ANAEROBIC Blood Culture adequate volume   Culture   Final    NO GROWTH 5 DAYS Performed at [facility], [place] [address] [place], [place] [address]    Report Status 02/07/2020 FINAL  Final  Blood culture (routine x 2)     Status: None   Collection Time: 02/02/20  4:22 PM   Specimen: BLOOD  Result Value Ref Range Status   Specimen Description BLOOD RIGHT ANTECUBITAL  Final   Special Requests   Final    BOTTLES DRAWN AEROBIC AND ANAEROBIC Blood Culture results may not be optimal due to an excessive volume of blood received in culture bottles   Culture   Final    NO GROWTH 5 DAYS Performed at [facility], [place] [address], [place], [place] [address]    Report Status 02/07/2020 FINAL  Final  Respiratory Panel by RT PCR (Flu A&B, Covid) - Nasopharyngeal Swab     Status: None   Collection Time: 02/02/20  6:22 PM   Specimen: Nasopharyngeal Swab  Result Value Ref Range Status   SARS Coronavirus 2 by RT PCR NEGATIVE NEGATIVE Final    Comment: (NOTE) SARS-CoV-2 target nucleic acids are NOT DETECTED. The SARS-CoV-2 RNA is generally detectable in upper respiratoy specimens during the acute phase of infection. The  lowest concentration of SARS-CoV-2 viral copies this assay can detect is 131 copies/mL. A negative result does not preclude SARS-Cov-2 infection and should not be used as the sole basis for treatment or other patient management decisions. A  negative result may occur with  improper specimen collection/handling, submission of specimen other than nasopharyngeal swab, presence of viral mutation(s) within the areas targeted by this assay, and inadequate number of viral copies (<131 copies/mL). A negative result must be combined with clinical observations, patient history, and epidemiological information. The expected result is Negative. Fact Sheet for Patients:  PinkCheek.be Fact Sheet for Healthcare Providers:  GravelBags.it This test is not yet ap proved or cleared by the [place] FDA and  has been authorized for detection and/or diagnosis of SARS-CoV-2 by FDA under an Emergency Use Authorization (EUA). This EUA will remain  in effect (meaning this test can be used) for the duration of the COVID-19 declaration under Section 564(b)(1) of the Act, 21 U.S.C. section 360bbb-3(b)(1), unless the authorization is terminated or revoked sooner.    Influenza A by PCR NEGATIVE NEGATIVE Final   Influenza B by PCR NEGATIVE NEGATIVE Final    Comment: (NOTE) The Xpert Xpress SARS-CoV-2/FLU/RSV assay is intended as an aid in  the diagnosis of influenza from Nasopharyngeal swab specimens and  should not be used as a sole basis for treatment. Nasal washings and  aspirates are unacceptable for Xpert Xpress SARS-CoV-2/FLU/RSV  testing. Fact Sheet for Patients: PinkCheek.be Fact Sheet for Healthcare Providers: GravelBags.it This test is not yet approved or cleared by the [place] FDA and  has been authorized for detection and/or diagnosis of SARS-CoV-2 by  FDA under an Emergency  Use Authorization (EUA). This EUA will remain  in effect (meaning this test can be used) for the duration of the  Covid-19 declaration under Section 564(b)(1) of the Act, 21  U.S.C. section 360bbb-3(b)(1), unless the authorization is  terminated or revoked. Performed at [facility], [place] [address] [place], [place] [address]       Studies: IR INT EXT BILIARY DRAIN WITH CHOLANGIOGRAM  Result Date: 02/06/2020 INDICATION: Biliary obstruction. MRCP demonstrates proximal CBD lesion. Failed ERCP. EXAM: PERCUTANEOUS TRANSHEPATIC CHOLANGIOGRAM PERCUTANEOUS INTERNAL/EXTERNAL BILIARY DRAIN CATHETER PLACEMENT MEDICATIONS: Patient was already receiving adequate prophylactic antibiotic coverage as an inpatient. ANESTHESIA/SEDATION: Intravenous Fentanyl 12mcg and Versed 2mg  were administered as conscious sedation during continuous monitoring of the patient's level of consciousness and physiological / cardiorespiratory status by the radiology RN, with a total moderate sedation time of 38 minutes. PROCEDURE: Informed written consent was obtained from the patient after a thorough discussion of the procedural risks, benefits and alternatives. All questions were addressed. Maximal Sterile Barrier Technique was utilized including caps, mask, sterile gowns, sterile gloves, sterile drape, hand hygiene and skin antiseptic. A timeout was performed prior to the initiation of the procedure. Dilated intrahepatic biliary tree was identified on ultrasound and a peripheral left lobe duct was selected for approach. Skin and subcutaneous tissues down to the liver capsule infiltrated with 1% lidocaine. Under real-time ultrasound guidance, a 21 gauge micropuncture needle was advanced into the duct. Small contrast injection under fluoroscopy confirmed appropriate positioning. A 018 guidewire would not advance. For this reason, percutaneous cholangiogram was performed through the needle. Proximal CBD high-grade stenosis was  confirmed. A peripheral left bile duct was identified in from a more advantageous angle, 21 gauge trocar needle advanced into the duct. 018 guidewire advanced centrally. A 5 French micropuncture dilator was placed. Catheter exchanged over an angled Glidewire for a 5 [place] Kumpe catheter, advanced to the distal CBD for additional cholangiographic images. The Kumpe the was then advanced into the distal duodenum and exchanged over an Amplatz wire for vascular dilator which facilitated placement  of a 7 [place] long vascular sheath. Through this, brush biopsy x2 the proximal CBD lesion was obtained. Subsequently, the sheath was exchanged over Amplatz wire for 8.5 French internal/external biliary drain catheter, placed with sideholes spanning the CBD lesion, and holes in the distal duodenum. Confirmatory contrast injection under fluoroscopy performed. The catheter was then flushed with saline and secured externally with 0 Prolene suture and StatLock, placed to external drain bag to maximize biliary decompression. The patient tolerated the procedure well. FLUOROSCOPY TIME:   COMPLICATIONS: None immediate. FINDINGS: The percutaneous transhepatic cholangiogram confirms mildly dilated intrahepatic biliary tree with a tapered somewhat irregular short-segment narrowing in the proximal common duct just distal to the confluence. Brush biopsies of the stenotic region were obtained x2. 8.5 French internal/external biliary drainage catheter placed as above. IMPRESSION: 1. Proximal CBD obstructing lesion with intrahepatic biliary ductal dilatation. 2. Brush biopsy of proximal CBD lesion. 3. Technically successful internal/external biliary drain catheter placement. Electronically Signed   By: Lucrezia Europe M.D.   On: 02/06/2020 13:49   IR ENDOLUMINAL BX OF BILIARY TREE  Result Date: 02/06/2020 INDICATION: Biliary obstruction. MRCP demonstrates proximal CBD lesion. Failed ERCP. EXAM: PERCUTANEOUS TRANSHEPATIC CHOLANGIOGRAM PERCUTANEOUS  INTERNAL/EXTERNAL BILIARY DRAIN CATHETER PLACEMENT MEDICATIONS: Patient was already receiving adequate prophylactic antibiotic coverage as an inpatient. ANESTHESIA/SEDATION: Intravenous Fentanyl 15mcg and Versed 2mg  were administered as conscious sedation during continuous monitoring of the patient's level of consciousness and physiological / cardiorespiratory status by the radiology RN, with a total moderate sedation time of 38 minutes. PROCEDURE: Informed written consent was obtained from the patient after a thorough discussion of the procedural risks, benefits and alternatives. All questions were addressed. Maximal Sterile Barrier Technique was utilized including caps, mask, sterile gowns, sterile gloves, sterile drape, hand hygiene and skin antiseptic. A timeout was performed prior to the initiation of the procedure. Dilated intrahepatic biliary tree was identified on ultrasound and a peripheral left lobe duct was selected for approach. Skin and subcutaneous tissues down to the liver capsule infiltrated with 1% lidocaine. Under real-time ultrasound guidance, a 21 gauge micropuncture needle was advanced into the duct. Small contrast injection under fluoroscopy confirmed appropriate positioning. A 018 guidewire would not advance. For this reason, percutaneous cholangiogram was performed through the needle. Proximal CBD high-grade stenosis was confirmed. A peripheral left bile duct was identified in from a more advantageous angle, 21 gauge trocar needle advanced into the duct. 018 guidewire advanced centrally. A 5 French micropuncture dilator was placed. Catheter exchanged over an angled Glidewire for a 5 [place] Kumpe catheter, advanced to the distal CBD for additional cholangiographic images. The Kumpe the was then advanced into the distal duodenum and exchanged over an Amplatz wire for vascular dilator which facilitated placement of a 7 French long vascular sheath. Through this, brush biopsy x2 the proximal CBD  lesion was obtained. Subsequently, the sheath was exchanged over Amplatz wire for 8.5 French internal/external biliary drain catheter, placed with sideholes spanning the CBD lesion, and holes in the distal duodenum. Confirmatory contrast injection under fluoroscopy performed. The catheter was then flushed with saline and secured externally with 0 Prolene suture and StatLock, placed to external drain bag to maximize biliary decompression. The patient tolerated the procedure well. FLUOROSCOPY TIME:  COMPLICATIONS: None immediate. FINDINGS: The percutaneous transhepatic cholangiogram confirms mildly dilated intrahepatic biliary tree with a tapered somewhat irregular short-segment narrowing in the proximal common duct just distal to the confluence. Brush biopsies of the stenotic region were obtained x2. 8.5 French internal/external biliary drainage catheter placed as  above. IMPRESSION: 1. Proximal CBD obstructing lesion with intrahepatic biliary ductal dilatation. 2. Brush biopsy of proximal CBD lesion. 3. Technically successful internal/external biliary drain catheter placement. Electronically Signed   By: Lucrezia Europe M.D.   On: 02/06/2020 13:49    Scheduled Meds: . folic acid  1 mg Oral Daily  . levothyroxine  67.5 mcg Intravenous Daily  . multivitamin with minerals  1 tablet Oral Daily  . potassium chloride  40 mEq Oral Once  . sodium chloride flush  3 mL Intravenous Once  . thiamine  100 mg Oral Daily   Or  . thiamine  100 mg Intravenous Daily    Continuous Infusions: . sodium chloride 100 mL/hr at 02/07/20 0136  . ampicillin-sulbactam (UNASYN) IV 1.5 g (02/07/20 1336)     LOS: 5 days     Alma Friendly, MD [facility]  If 7PM-7AM, please contact night-coverage [email or web address] 02/07/2020, 2:13 PM

## 2020-02-08 LAB — CBC WITH DIFFERENTIAL/PLATELET
Abs Immature Granulocytes: 0.04 10*3/uL (ref 0.00–0.07)
Basophils Absolute: 0 10*3/uL (ref 0.0–0.1)
Basophils Relative: 0 %
Eosinophils Absolute: 0.1 10*3/uL (ref 0.0–0.5)
Eosinophils Relative: 1 %
HCT: 39.8 % (ref 39.0–52.0)
Hemoglobin: 13.3 g/dL (ref 13.0–17.0)
Immature Granulocytes: 0 %
Lymphocytes Relative: 18 %
Lymphs Abs: 1.7 10*3/uL (ref 0.7–4.0)
MCH: 24.1 pg — ABNORMAL LOW (ref 26.0–34.0)
MCHC: 33.4 g/dL (ref 30.0–36.0)
MCV: 72 fL — ABNORMAL LOW (ref 80.0–100.0)
Monocytes Absolute: 0.6 10*3/uL (ref 0.1–1.0)
Monocytes Relative: 7 %
Neutro Abs: 6.8 10*3/uL (ref 1.7–7.7)
Neutrophils Relative %: 74 %
Platelets: 303 10*3/uL (ref 150–400)
RBC: 5.53 MIL/uL (ref 4.22–5.81)
RDW: 13.8 % (ref 11.5–15.5)
WBC: 9.2 10*3/uL (ref 4.0–10.5)
nRBC: 0 % (ref 0.0–0.2)

## 2020-02-08 LAB — COMPREHENSIVE METABOLIC PANEL
ALT: 28 U/L (ref 0–44)
AST: 21 U/L (ref 15–41)
Albumin: 2.8 g/dL — ABNORMAL LOW (ref 3.5–5.0)
Alkaline Phosphatase: 155 U/L — ABNORMAL HIGH (ref 38–126)
Anion gap: 8 (ref 5–15)
BUN: 6 mg/dL — ABNORMAL LOW (ref 8–23)
CO2: 23 mmol/L (ref 22–32)
Calcium: 8.6 mg/dL — ABNORMAL LOW (ref 8.9–10.3)
Chloride: 105 mmol/L (ref 98–111)
Creatinine, Ser: 0.71 mg/dL (ref 0.61–1.24)
GFR calc Af Amer: >60 mL/min (ref >60)
GFR calc non Af Amer: >60 mL/min (ref >60)
Glucose, Bld: 90 mg/dL (ref 70–99)
Potassium: 3.7 mmol/L (ref 3.5–5.1)
Sodium: 136 mmol/L (ref 135–145)
Total Bilirubin: 1.3 mg/dL — ABNORMAL HIGH (ref 0.3–1.2)
Total Protein: 6.5 g/dL (ref 6.5–8.1)

## 2020-02-08 LAB — GLUCOSE, CAPILLARY
Glucose-Capillary: 87 mg/dL (ref 70–99)
Glucose-Capillary: 143 mg/dL — ABNORMAL HIGH (ref 70–99)
Glucose-Capillary: 93 mg/dL (ref 70–99)

## 2020-02-08 MED ORDER — ASPIRIN EC 81 MG PO TBEC: 81.0000 mg | DELAYED_RELEASE_TABLET | Freq: Every day | ORAL | Status: DC

## 2020-02-08 MED ORDER — FLUTICASONE PROPIONATE 50 MCG/ACT NA SUSP
1.0000 | Freq: Every day | NASAL | Status: DC
  Administered 2020-02-09 – 2020-02-10 (×2): 1 via NASAL
  Filled 2020-02-08: qty 16

## 2020-02-08 MED ORDER — LORATADINE 10 MG PO TABS
10.0000 mg | ORAL_TABLET | Freq: Every day | ORAL | Status: DC
  Administered 2020-02-08 – 2020-02-10 (×3): 10 mg via ORAL
  Filled 2020-02-08 (×3): qty 1

## 2020-02-08 MED ORDER — PANTOPRAZOLE SODIUM 40 MG PO TBEC
40.0000 mg | DELAYED_RELEASE_TABLET | Freq: Every day | ORAL | Status: DC
  Administered 2020-02-08 – 2020-02-10 (×3): 40 mg via ORAL
  Filled 2020-02-08 (×3): qty 1

## 2020-02-08 MED ORDER — LEVOTHYROXINE SODIUM 25 MCG PO TABS
137.0000 ug | ORAL_TABLET | Freq: Every day | ORAL | Status: DC
  Administered 2020-02-09 – 2020-02-10 (×2): 137 ug via ORAL
  Filled 2020-02-08 (×2): qty 1

## 2020-02-08 MED ORDER — MIRTAZAPINE 15 MG PO TABS
15.0000 mg | ORAL_TABLET | Freq: Every day | ORAL | Status: DC
  Administered 2020-02-08 – 2020-02-09 (×2): 15 mg via ORAL
  Filled 2020-02-08 (×2): qty 1

## 2020-02-08 MED ORDER — SODIUM CHLORIDE 0.9% FLUSH
5.0000 mL | Freq: Three times a day (TID) | INTRAVENOUS | Status: DC
  Administered 2020-02-09 (×2): 5 mL

## 2020-02-08 MED ORDER — HYDROCODONE-ACETAMINOPHEN 5-325 MG PO TABS: 1.0000 | ORAL_TABLET | ORAL | Status: DC | PRN

## 2020-02-08 MED ORDER — AMLODIPINE BESYLATE 10 MG PO TABS
10.0000 mg | ORAL_TABLET | Freq: Every day | ORAL | Status: DC
  Administered 2020-02-08 – 2020-02-10 (×3): 10 mg via ORAL
  Filled 2020-02-08 (×3): qty 1

## 2020-02-08 NOTE — Progress Notes (Signed)
PROGRESS NOTE  Rodney Romero [id number] DOB: 14-Apr-1946 DOA: 02/02/2020 PCP: [facility]  HPI/Recap of past 24 hours: [name] is a 74 y.o. male with history of hypertension hypothyroidism bipolar disorder, alcohol abuse presents to the ER because of abdominal pain.  Patient states he has been abdominal pain mostly in the right upper quadrant for the last 2 days.  Has some subjective feeling of fever chills and diaphoresis.  Denies chest pain shortness of breath.  Denies any vomiting or diarrhea. In the ED, RUQ showed features concerning for cholecystitis. Labs showed AST 176 ALT 152 creatinine 1.8 alkaline phosphatase 313 total bilirubin 9.7.  CBC shows WBC of 14.5 high sensitive troponin was 5 and 4 EKG shows normal sinus rhythm.  General surgery, GI consulted.  Patient admitted for further management.     Today, pt reports abdominal pain around drain, denies N/V, no fever/chills noted. Noted to have non sustained brady episodes while sleeping, HR around 40s. Bedside monitor currently shows HR in the 60s.. Denies any chest pain, SOB, dizziness.    Assessment/Plan: Principal Problem:   Abdominal pain Active Problems:   Elevated LFTs   Hypertension   Cholecystitis, acute   Acute cholecystitis versus cholangiocarcinoma s/p IR biliary drain placement with cholangiogram on 02/06/20 Currently afebrile, with resolved leukocytosis BC x2, NGTD CA 19-9 46, CEA 11.3, GGT 199 Right upper quadrant ultrasound with possible acute cholecystitis CT abdomen pelvis showed acute cholecystitis GI on board, s/p ERCP on 02/05/2020, unfortunately scope position was very difficult despite multiple maneuvers, not able to cannulate CBD. General surgery on board, recommend percutaneous cholecystostomy by IR S/P IR biliary drain placement with cholangiogram on 02/06/20 which showed proximal CBD obstructing lesion with intrahepatic biliary ductal dilatation. Brush biopsy  of proximal CBD lesion pending result Advance to FLD Continue IV Unasyn Monitor closely  Hyperbilirubinemia T bili currently trended down Continue to trend  Sinus bradycardia Fluctuating Telemetry reported HR in 40s on 02/05/20, checked manually at bedside, HR in 50s, EKG showed sinus brady, HR 50s. Asymptomatic. Bedside continuous monitoring ECHO with EF 55-60%, no RWMA Continue telemetry  Hypokalemia Replace prn  Hypertension Continue IV hydralazine as needed  Hypothyroidism Free T4 1.21,  TSH 1.255 Continue IV Synthroid  Alcohol abuse Counseled against abuse CIWA protocol        Malnutrition Type:      Malnutrition Characteristics:      Nutrition Interventions:       Estimated body mass index is 19.27 kg/m as calculated from the following:   Height as of this encounter: 5\' 7"  (1.702 m).   Weight as of this encounter: 55.8 kg.     Code Status: Full  Family Communication: Discussed with wife at bedside on 02/07/20  Disposition Plan: Possibly Home likely after sign off from IR, surgery   Consultants:  GI  General surgery  IR  Procedures:  ERCP on 02/05/20  IR biliary drain placement on 02/06/20  Antimicrobials:  Unasyn  DVT prophylaxis: Lovenox   Objective: Vitals:   02/07/20 1329 02/07/20 2113 02/07/20 2114 02/08/20 0402  BP: (!) 148/73 (!) 160/74 (!) 161/87 (!) 164/90  Pulse: (!) 49 (!) 52 (!) 49 (!) 48  Resp: 18  20 20   Temp: 98.4 F (36.9 C) 98.5 F (36.9 C) 98.5 F (36.9 C) 98.8 F (37.1 C)  TempSrc: Oral Oral Oral Oral  SpO2: 98% 99% 99% 100%  Weight:      Height:  Intake/Output Summary (Last 24 hours) at 02/08/2020 1114 Last data filed at 02/08/2020 0921 Gross per 24 hour  Intake 1063.75 ml  Output 2125 ml  Net -1061.25 ml   Filed Weights   02/02/20 1146 02/02/20 2102 02/05/20 1019  Weight: 61.7 kg 55.8 kg 55.8 kg    Exam:  General: NAD   Cardiovascular: S1, S2 present  Respiratory:  CTAB  Abdomen: Soft, +tender around biliary drain, nondistended, bowel sounds present  Musculoskeletal: No bilateral pedal edema noted  Skin: Normal  Psychiatry: Normal mood   Data Reviewed: CBC: Recent Labs  Lab 02/04/20 0208 02/05/20 0201 02/06/20 0246 02/07/20 0327 02/08/20 0232  WBC 14.1* 12.0* 7.9 9.4 9.2  NEUTROABS 11.9* 9.7* 6.5 7.0 6.8  HGB 12.4* 12.5* 12.3* 11.8* 13.3  HCT 36.3* 37.0* 36.2* 35.6* 39.8  MCV 71.7* 71.6* 71.7* 72.2* 72.0*  PLT 136* 163 236 263 [id number]   Basic Metabolic Panel: Recent Labs  Lab 02/02/20 1935 02/03/20 0325 02/04/20 0208 02/05/20 0201 02/06/20 0246 02/07/20 0327 02/08/20 0232  NA  --    < > 133* 136 137 138 136  K  --    < > 3.4* 3.3* 3.6 3.2* 3.7  CL  --    < > 103 103 105 107 105  CO2  --    < > 23 23 22 23 23   GLUCOSE  --    < > 108* 80 130* 98 90  BUN  --    < > 6* 7* 14 8 6*  CREATININE  --    < > 0.84 0.83 0.77 0.71 0.71  CALCIUM  --    < > 8.3* 8.4* 8.3* 8.2* 8.6*  MG 1.8  --   --   --   --   --   --    < > = values in this interval not displayed.   GFR: Estimated Creatinine Clearance: 65.9 mL/min (by C-G formula based on SCr of 0.71 mg/dL). Liver Function Tests: Recent Labs  Lab 02/04/20 0208 02/05/20 0201 02/06/20 0246 02/07/20 0327 02/08/20 0232  AST 27 19 13* 29 21  ALT 52* 38 27 33 28  ALKPHOS 180* 162* 157* 146* 155*  BILITOT 3.7* 3.0* 1.7* 1.4* 1.3*  PROT 6.2* 6.2* 5.9* 6.0* 6.5  ALBUMIN 2.7* 2.5* 2.5* 2.7* 2.8*   Recent Labs  Lab 02/02/20 1150 02/04/20 0208  LIPASE 21 18   No results for input(s): AMMONIA in the last 168 hours. Coagulation Profile: Recent Labs  Lab 02/02/20 2143 02/03/20 0325 02/04/20 0808  INR 1.2 1.3* 1.2   Cardiac Enzymes: No results for input(s): CKTOTAL, CKMB, CKMBINDEX, TROPONINI in the last 168 hours. BNP (last 3 results) No results for input(s): PROBNP in the last 8760 hours. HbA1C: No results for input(s): HGBA1C in the last 72 hours. CBG: Recent Labs  Lab  02/06/20 2357 02/07/20 0715 02/07/20 1551 02/07/20 2115 02/08/20 0750  GLUCAP 104* 92 97 89 87   Lipid Profile: No results for input(s): CHOL, HDL, LDLCALC, TRIG, CHOLHDL, LDLDIRECT in the last 72 hours. Thyroid Function Tests: Recent Labs    02/07/20 0327  TSH 1.255  FREET4 1.21*   Anemia Panel: No results for input(s): VITAMINB12, FOLATE, FERRITIN, TIBC, IRON, RETICCTPCT in the last 72 hours. Urine analysis:    Component Value Date/Time   COLORURINE AMBER (A) 02/02/2020 1830   APPEARANCEUR CLEAR 02/02/2020 1830   LABSPEC >1.046 (H) 02/02/2020 1830   PHURINE 6.0 02/02/2020 [place] 02/02/2020 1830  HGBUR SMALL (A) 02/02/2020 1830   BILIRUBINUR SMALL (A) 02/02/2020 1830   KETONESUR NEGATIVE 02/02/2020 1830   PROTEINUR NEGATIVE 02/02/2020 1830   UROBILINOGEN 1.0 09/04/2015 2309   NITRITE NEGATIVE 02/02/2020 1830   LEUKOCYTESUR NEGATIVE 02/02/2020 1830   Sepsis Labs: @LABRCNTIP (procalcitonin:4,lacticidven:4)  ) Recent Results (from the past 240 hour(s))  Novel Coronavirus, NAA (Hosp order, Send-out to Ref Lab; TAT 18-24 hrs     Status: None   Collection Time: 02/02/20 11:04 AM   Specimen: Nasopharyngeal Swab; Respiratory  Result Value Ref Range Status   SARS-CoV-2, NAA NOT DETECTED NOT DETECTED Final    Comment: (NOTE) This nucleic acid amplification test was developed and its performance characteristics determined by [employer]. Nucleic acid amplification tests include RT-PCR and TMA. This test has not been FDA cleared or approved. This test has been authorized by FDA under an Emergency Use Authorization (EUA). This test is only authorized for the duration of time the declaration that circumstances exist justifying the authorization of the emergency use of in vitro diagnostic tests for detection of SARS-CoV-2 virus and/or diagnosis of COVID-19 infection under section 564(b)(1) of the Act, 21 U.S.C. [id number]) (1), unless the  authorization is terminated or revoked sooner. When diagnostic testing is negative, the possibility of a false negative result should be considered in the context of a patient's recent exposures and the presence of clinical signs and symptoms consistent with COVID-19. An individual without symptoms of COVID- 19 and who is not shedding SARS-CoV-2  virus would expect to have a negative (not detected) result in this assay. Performed At: [facility] [address] [place], [place] [id number] Rush Farmer MD [id number]    [place]  Final    Comment: Performed at [facility], [place] [address] [place], [place] [address]  Blood culture (routine x 2)     Status: None   Collection Time: 02/02/20  4:22 PM   Specimen: BLOOD  Result Value Ref Range Status   Specimen Description BLOOD LEFT ANTECUBITAL  Final   Special Requests   Final    BOTTLES DRAWN AEROBIC AND ANAEROBIC Blood Culture adequate volume   Culture   Final    NO GROWTH 5 DAYS Performed at [facility], [place] [address] [place], [place] [address]    Report Status 02/07/2020 FINAL  Final  Blood culture (routine x 2)     Status: None   Collection Time: 02/02/20  4:22 PM   Specimen: BLOOD  Result Value Ref Range Status   Specimen Description BLOOD RIGHT ANTECUBITAL  Final   Special Requests   Final    BOTTLES DRAWN AEROBIC AND ANAEROBIC Blood Culture results may not be optimal due to an excessive volume of blood received in culture bottles   Culture   Final    NO GROWTH 5 DAYS Performed at [facility], [place] [address] [place],  [address]    Report Status 02/07/2020 FINAL  Final  Respiratory Panel by RT PCR (Flu A&B, Covid) - Nasopharyngeal Swab     Status: None   Collection Time: 02/02/20  6:22 PM   Specimen: Nasopharyngeal Swab  Result Value Ref Range Status   SARS Coronavirus 2 by RT PCR NEGATIVE NEGATIVE Final    Comment: (NOTE) SARS-CoV-2 target nucleic acids  are NOT DETECTED. The SARS-CoV-2 RNA is generally detectable in upper respiratoy specimens during the acute phase of infection. The lowest concentration of SARS-CoV-2 viral copies this assay can detect is 131  copies/mL. A negative result does not preclude SARS-Cov-2 infection and should not be used as the sole basis for treatment or other patient management decisions. A negative result may occur with  improper specimen collection/handling, submission of specimen other than nasopharyngeal swab, presence of viral mutation(s) within the areas targeted by this assay, and inadequate number of viral copies (<131 copies/mL). A negative result must be combined with clinical observations, patient history, and epidemiological information. The expected result is Negative. Fact Sheet for Patients:  PinkCheek.be Fact Sheet for Healthcare Providers:  GravelBags.it This test is not yet ap proved or cleared by the [place] FDA and  has been authorized for detection and/or diagnosis of SARS-CoV-2 by FDA under an Emergency Use Authorization (EUA). This EUA will remain  in effect (meaning this test can be used) for the duration of the COVID-19 declaration under Section 564(b)(1) of the Act, 21 U.S.C. section 360bbb-3(b)(1), unless the authorization is terminated or revoked sooner.    Influenza A by PCR NEGATIVE NEGATIVE Final   Influenza B by PCR NEGATIVE NEGATIVE Final    Comment: (NOTE) The Xpert Xpress SARS-CoV-2/FLU/RSV assay is intended as an aid in  the diagnosis of influenza from Nasopharyngeal swab specimens and  should not be used as a sole basis for treatment. Nasal washings and  aspirates are unacceptable for Xpert Xpress SARS-CoV-2/FLU/RSV  testing. Fact Sheet for Patients: PinkCheek.be Fact Sheet for Healthcare Providers: GravelBags.it This test is not yet approved or  cleared by the [place] FDA and  has been authorized for detection and/or diagnosis of SARS-CoV-2 by  FDA under an Emergency Use Authorization (EUA). This EUA will remain  in effect (meaning this test can be used) for the duration of the  Covid-19 declaration under Section 564(b)(1) of the Act, 21  U.S.C. section 360bbb-3(b)(1), unless the authorization is  terminated or revoked. Performed at [facility], [place] [address] [place], [place] [address]       Studies: IR INT EXT BILIARY DRAIN WITH CHOLANGIOGRAM  Result Date: 02/06/2020 INDICATION: Biliary obstruction. MRCP demonstrates proximal CBD lesion. Failed ERCP. EXAM: PERCUTANEOUS TRANSHEPATIC CHOLANGIOGRAM PERCUTANEOUS INTERNAL/EXTERNAL BILIARY DRAIN CATHETER PLACEMENT MEDICATIONS: Patient was already receiving adequate prophylactic antibiotic coverage as an inpatient. ANESTHESIA/SEDATION: Intravenous Fentanyl 117mcg and Versed 2mg  were administered as conscious sedation during continuous monitoring of the patient's level of consciousness and physiological / cardiorespiratory status by the radiology RN, with a total moderate sedation time of 38 minutes. PROCEDURE: Informed written consent was obtained from the patient after a thorough discussion of the procedural risks, benefits and alternatives. All questions were addressed. Maximal Sterile Barrier Technique was utilized including caps, mask, sterile gowns, sterile gloves, sterile drape, hand hygiene and skin antiseptic. A timeout was performed prior to the initiation of the procedure. Dilated intrahepatic biliary tree was identified on ultrasound and a peripheral left lobe duct was selected for approach. Skin and subcutaneous tissues down to the liver capsule infiltrated with 1% lidocaine. Under real-time ultrasound guidance, a 21 gauge micropuncture needle was advanced into the duct. Small contrast injection under fluoroscopy confirmed appropriate positioning. A 018 guidewire would  not advance. For this reason, percutaneous cholangiogram was performed through the needle. Proximal CBD high-grade stenosis was confirmed. A peripheral left bile duct was identified in from a more advantageous angle, 21 gauge trocar needle advanced into the duct. 018 guidewire advanced centrally. A 5 French micropuncture dilator was placed. Catheter exchanged over an angled Glidewire for a 5 French Kumpe catheter, advanced to the distal CBD  for additional cholangiographic images. The Kumpe the was then advanced into the distal duodenum and exchanged over an Amplatz wire for vascular dilator which facilitated placement of a 7 French long vascular sheath. Through this, brush biopsy x2 the proximal CBD lesion was obtained. Subsequently, the sheath was exchanged over Amplatz wire for 8.5 French internal/external biliary drain catheter, placed with sideholes spanning the CBD lesion, and holes in the distal duodenum. Confirmatory contrast injection under fluoroscopy performed. The catheter was then flushed with saline and secured externally with 0 Prolene suture and StatLock, placed to external drain bag to maximize biliary decompression. The patient tolerated the procedure well. FLUOROSCOPY TIME:   COMPLICATIONS: None immediate. FINDINGS: The percutaneous transhepatic cholangiogram confirms mildly dilated intrahepatic biliary tree with a tapered somewhat irregular short-segment narrowing in the proximal common duct just distal to the confluence. Brush biopsies of the stenotic region were obtained x2. 8.5 French internal/external biliary drainage catheter placed as above. IMPRESSION: 1. Proximal CBD obstructing lesion with intrahepatic biliary ductal dilatation. 2. Brush biopsy of proximal CBD lesion. 3. Technically successful internal/external biliary drain catheter placement. Electronically Signed   By: Lucrezia Europe M.D.   On: 02/06/2020 13:49   IR ENDOLUMINAL BX OF BILIARY TREE  Result Date: 02/06/2020 INDICATION:  Biliary obstruction. MRCP demonstrates proximal CBD lesion. Failed ERCP. EXAM: PERCUTANEOUS TRANSHEPATIC CHOLANGIOGRAM PERCUTANEOUS INTERNAL/EXTERNAL BILIARY DRAIN CATHETER PLACEMENT MEDICATIONS: Patient was already receiving adequate prophylactic antibiotic coverage as an inpatient. ANESTHESIA/SEDATION: Intravenous Fentanyl 180mcg and Versed 2mg  were administered as conscious sedation during continuous monitoring of the patient's level of consciousness and physiological / cardiorespiratory status by the radiology RN, with a total moderate sedation time of 38 minutes. PROCEDURE: Informed written consent was obtained from the patient after a thorough discussion of the procedural risks, benefits and alternatives. All questions were addressed. Maximal Sterile Barrier Technique was utilized including caps, mask, sterile gowns, sterile gloves, sterile drape, hand hygiene and skin antiseptic. A timeout was performed prior to the initiation of the procedure. Dilated intrahepatic biliary tree was identified on ultrasound and a peripheral left lobe duct was selected for approach. Skin and subcutaneous tissues down to the liver capsule infiltrated with 1% lidocaine. Under real-time ultrasound guidance, a 21 gauge micropuncture needle was advanced into the duct. Small contrast injection under fluoroscopy confirmed appropriate positioning. A 018 guidewire would not advance. For this reason, percutaneous cholangiogram was performed through the needle. Proximal CBD high-grade stenosis was confirmed. A peripheral left bile duct was identified in from a more advantageous angle, 21 gauge trocar needle advanced into the duct. 018 guidewire advanced centrally. A 5 French micropuncture dilator was placed. Catheter exchanged over an angled Glidewire for a 5 [place] Kumpe catheter, advanced to the distal CBD for additional cholangiographic images. The Kumpe the was then advanced into the distal duodenum and exchanged over an Amplatz wire  for vascular dilator which facilitated placement of a 7 French long vascular sheath. Through this, brush biopsy x2 the proximal CBD lesion was obtained. Subsequently, the sheath was exchanged over Amplatz wire for 8.5 French internal/external biliary drain catheter, placed with sideholes spanning the CBD lesion, and holes in the distal duodenum. Confirmatory contrast injection under fluoroscopy performed. The catheter was then flushed with saline and secured externally with 0 Prolene suture and StatLock, placed to external drain bag to maximize biliary decompression. The patient tolerated the procedure well. FLUOROSCOPY TIME:  COMPLICATIONS: None immediate. FINDINGS: The percutaneous transhepatic cholangiogram confirms mildly dilated intrahepatic biliary tree with a tapered somewhat irregular short-segment narrowing in  the proximal common duct just distal to the confluence. Brush biopsies of the stenotic region were obtained x2. 8.5 French internal/external biliary drainage catheter placed as above. IMPRESSION: 1. Proximal CBD obstructing lesion with intrahepatic biliary ductal dilatation. 2. Brush biopsy of proximal CBD lesion. 3. Technically successful internal/external biliary drain catheter placement. Electronically Signed   By: Lucrezia Europe M.D.   On: 02/06/2020 13:49    Scheduled Meds: . enoxaparin (LOVENOX) injection  40 mg Subcutaneous Q24H  . folic acid  1 mg Oral Daily  . levothyroxine  67.5 mcg Intravenous Daily  . multivitamin with minerals  1 tablet Oral Daily  . sodium chloride flush  3 mL Intravenous Once  . thiamine  100 mg Oral Daily   Or  . thiamine  100 mg Intravenous Daily    Continuous Infusions: . sodium chloride 75 mL/hr at 02/08/20 0024  . ampicillin-sulbactam (UNASYN) IV 1.5 g (02/08/20 0357)     LOS: 6 days     Alma Friendly, MD [facility]  If 7PM-7AM, please contact night-coverage [email or web address] 02/08/2020, 11:14 AM

## 2020-02-08 NOTE — Progress Notes (Signed)
Referring Physician(s): * No referring provider recorded for this case *  Supervising Physician: Sandi Mariscal  Patient Status:  [facility] - In-pt  Chief Complaint: Acute cholecystitis vs. cholangiocarcinoma  Subjective:  No complaints during visit today.  Drain non-tender.  Bilious output.   Allergies: Patient has no known allergies.  Medications: Prior to Admission medications   Medication Sig Start Date End Date Taking? Authorizing Provider  amLODipine (NORVASC) 10 MG tablet Take 10 mg by mouth daily.   Yes [provider]  aspirin EC 81 MG tablet Take 81 mg by mouth daily.   Yes [provider]  fluticasone (FLONASE) 50 MCG/ACT nasal spray Place 1 spray into both nostrils daily.   Yes [provider]  galantamine (RAZADYNE ER) 8 MG 24 hr capsule Take 8 mg by mouth daily with breakfast.   Yes [provider]  levothyroxine (SYNTHROID, LEVOTHROID) 137 MCG tablet Take 137 mcg by mouth daily before breakfast.   Yes [provider]  loratadine (CLARITIN) 10 MG tablet Take 10 mg by mouth daily.   Yes [provider]  omeprazole (PRILOSEC) 20 MG capsule Take 20 mg by mouth daily.   Yes [provider]  enoxaparin (LOVENOX) 40 MG/0.4ML injection Inject 0.4 mLs (40 mg total) into the skin daily. Inject one syringe of Lovenox once a day for 10 days following surgery. Then resume one baby aspirin once a day. Patient not taking: Reported on 02/03/2020 06/28/18   Edmisten, Ok Anis, PA  methocarbamol (ROBAXIN) 500 MG tablet Take 1 tablet (500 mg total) by mouth every 6 (six) hours as needed for muscle spasms. Patient not taking: Reported on 02/03/2020 06/28/18   Edmisten, Drue Dun L, PA  mirtazapine (REMERON) 15 MG tablet Take 15 mg by mouth at bedtime.    [provider]  oxyCODONE (OXY IR/ROXICODONE) 5 MG immediate release tablet Take 1-2 tablets (5-10 mg total) by mouth every 6 (six) hours as needed for moderate pain or  severe pain. Patient not taking: Reported on 02/03/2020 06/28/18   Derl Barrow, PA  traZODone (DESYREL) 50 MG tablet Take 50 mg by mouth at bedtime as needed for sleep.    [provider]     Vital Signs: BP (!) 166/88 (BP Location: Right Arm)   Pulse (!) 57   Temp 98.4 F (36.9 C) (Oral)   Resp 17   Ht 5\' 7"  (1.702 m)   Wt 123 lb 0.3 oz (55.8 kg)   SpO2 99%   BMI 19.27 kg/m   Physical Exam  NAD, alert Abdomen: soft, non-tender.  Central drain in place.  Insertion site c/d/i.  Bilious output in collection bag.   Imaging:   Labs:  CBC: Recent Labs    02/05/20 0201 02/06/20 0246 02/07/20 0327 02/08/20 0232  WBC 12.0* 7.9 9.4 9.2  HGB 12.5* 12.3* 11.8* 13.3  HCT 37.0* 36.2* 35.6* 39.8  PLT 163 236 263 303    COAGS: Recent Labs    02/02/20 2143 02/03/20 0325 02/04/20 0808  INR 1.2 1.3* 1.2    BMP: Recent Labs    02/05/20 0201 02/06/20 0246 02/07/20 0327 02/08/20 0232  NA 136 137 138 136  K 3.3* 3.6 3.2* 3.7  CL 103 105 107 105  CO2 23 22 23 23   GLUCOSE 80 130* 98 90  BUN 7* 14 8 6*  CALCIUM 8.4* 8.3* 8.2* 8.6*  CREATININE 0.83 0.77 0.71 0.71  GFRNONAA >60 >60 >60 >60  GFRAA >60 >60 >60 >60  LIVER FUNCTION TESTS: Recent Labs    02/05/20 0201 02/06/20 0246 02/07/20 0327 02/08/20 0232  BILITOT 3.0* 1.7* 1.4* 1.3*  AST 19 13* 29 21  ALT 38 27 33 28  ALKPHOS 162* 157* 146* 155*  PROT 6.2* 5.9* 6.0* 6.5  ALBUMIN 2.5* 2.5* 2.7* 2.8*    Assessment and Plan: Acute cholecystitis versus cholangiocarcinoma s/p biliary drain placement 02/06/20 Drain remains in place. Afebrile. On Unasyn. WBC 9.2 Tbili down to 1.3 Cytology pending.  Tolerating diet.   Continue current management.   IR following.   Electronically Signed: Docia Barrier, PA 02/08/2020, 2:43 PM   I spent a total of 15 Minutes at the the patient's bedside AND on the patient's hospital floor or unit, greater than 50% of which was  counseling/coordinating care for acute cholecystitis.

## 2020-02-08 NOTE — Progress Notes (Signed)
Notified by central telemetry that pt's heart rate went down to 38 at 0333.It was not sustained. Pt denied pain . In no distress. Vital signs at bedside-164/90,pulse 48. Text paged Jeannette Corpus with this information. Heart rate has been running 50's-60's since then.

## 2020-02-09 LAB — CBC WITH DIFFERENTIAL/PLATELET
Abs Immature Granulocytes: 0.04 10*3/uL (ref 0.00–0.07)
Basophils Absolute: 0 10*3/uL (ref 0.0–0.1)
Basophils Relative: 0 %
Eosinophils Absolute: 0.1 10*3/uL (ref 0.0–0.5)
Eosinophils Relative: 1 %
HCT: 42.5 % (ref 39.0–52.0)
Hemoglobin: 14.4 g/dL (ref 13.0–17.0)
Immature Granulocytes: 0 %
Lymphocytes Relative: 24 %
Lymphs Abs: 2.1 10*3/uL (ref 0.7–4.0)
MCH: 23.9 pg — ABNORMAL LOW (ref 26.0–34.0)
MCHC: 33.9 g/dL (ref 30.0–36.0)
MCV: 70.6 fL — ABNORMAL LOW (ref 80.0–100.0)
Monocytes Absolute: 0.6 10*3/uL (ref 0.1–1.0)
Monocytes Relative: 7 %
Neutro Abs: 6 10*3/uL (ref 1.7–7.7)
Neutrophils Relative %: 68 %
Platelets: 370 10*3/uL (ref 150–400)
RBC: 6.02 MIL/uL — ABNORMAL HIGH (ref 4.22–5.81)
RDW: 13.5 % (ref 11.5–15.5)
WBC: 8.9 10*3/uL (ref 4.0–10.5)
nRBC: 0 % (ref 0.0–0.2)

## 2020-02-09 LAB — COMPREHENSIVE METABOLIC PANEL
ALT: 24 U/L (ref 0–44)
AST: 20 U/L (ref 15–41)
Albumin: 3 g/dL — ABNORMAL LOW (ref 3.5–5.0)
Alkaline Phosphatase: 143 U/L — ABNORMAL HIGH (ref 38–126)
Anion gap: 10 (ref 5–15)
BUN: 7 mg/dL — ABNORMAL LOW (ref 8–23)
CO2: 24 mmol/L (ref 22–32)
Calcium: 9.1 mg/dL (ref 8.9–10.3)
Chloride: 103 mmol/L (ref 98–111)
Creatinine, Ser: 0.86 mg/dL (ref 0.61–1.24)
GFR calc Af Amer: >60 mL/min (ref >60)
GFR calc non Af Amer: >60 mL/min (ref >60)
Glucose, Bld: 91 mg/dL (ref 70–99)
Potassium: 3.6 mmol/L (ref 3.5–5.1)
Sodium: 137 mmol/L (ref 135–145)
Total Bilirubin: 1.4 mg/dL — ABNORMAL HIGH (ref 0.3–1.2)
Total Protein: 6.8 g/dL (ref 6.5–8.1)

## 2020-02-09 LAB — GLUCOSE, CAPILLARY
Glucose-Capillary: 102 mg/dL — ABNORMAL HIGH (ref 70–99)
Glucose-Capillary: 108 mg/dL — ABNORMAL HIGH (ref 70–99)
Glucose-Capillary: 85 mg/dL (ref 70–99)
Glucose-Capillary: 93 mg/dL (ref 70–99)

## 2020-02-09 LAB — CYTOLOGY - NON PAP

## 2020-02-09 NOTE — Progress Notes (Signed)
Physical Therapy Treatment/ Discharge  Patient Details Name: Rodney Romero MRN: [id number] DOB: February 15, 1946 Today's Date: 02/09/2020    History of Present Illness Pt is a 74 y/o with PMH of HTN, bipolar disorder, alcohol abuse presenting to ER due to abdominal pain x 2 days. S/Romero ERCP 02/05/20, workup for Acute cholecystitis versus cholangiocarcinoma versus Mirizzi syndrome. S/Romero biliary drain placement 02/06/20.     PT Comments    Pt pleasant, sitting in recliner on arrival. Pt reports no pain and moving around room with staff assist. Pt able to increase gait to include entire unit and stairs without difficulty or pain. Pt reports feeling back to baseline function and agreeable to continued mobility with staff or with managing IV pole on his own acutely. Pt educated for general HEP and was able to perform. Pt has met goals, is at baseline function and no further therapy needs at this time with pt aware and agreeable.     Follow Up Recommendations  No PT follow up;Supervision - Intermittent     Equipment Recommendations  None recommended by PT    Recommendations for Other Services       Precautions / Restrictions Precautions Precautions: Fall Precaution Comments: drain Restrictions Weight Bearing Restrictions: No    Mobility  Bed Mobility Overal bed mobility: Modified Independent             General bed mobility comments: in chair on arrival with return to chair per pt request  Transfers Overall transfer level: Modified independent Equipment used: None Transfers: Sit to/from Stand Sit to Stand: Modified independent (Device/Increase time)            Ambulation/Gait Ambulation/Gait assistance: Modified independent (Device/Increase time) Gait Distance (Feet): 500 Feet Assistive device: None Gait Pattern/deviations: Step-through pattern;Decreased stride length   Gait velocity interpretation: >4.37 ft/sec, indicative of normal walking speed General Gait Details: pt  steady without LOB and reports baseline gait and speed. Pt able to recall room number and find appropriately   Stairs Stairs: Yes Stairs assistance: Modified independent (Device/Increase time) Stair Management: One rail Right;Alternating pattern;Forwards Number of Stairs: 4     Wheelchair Mobility    Modified Rankin (Stroke Patients Only)       Balance Overall balance assessment: No apparent balance deficits (not formally assessed)                                          Cognition Arousal/Alertness: Awake/alert Behavior During Therapy: WFL for tasks assessed/performed Overall Cognitive Status: Within Functional Limits for tasks assessed                                        Exercises General Exercises - Lower Extremity Long Arc Quad: AROM;Both;Seated;20 reps Hip Flexion/Marching: AROM;Both;Seated;20 reps Toe Raises: AROM;Both;Seated;20 reps Heel Raises: AROM;Both;Seated;20 reps    General Comments        Pertinent Vitals/Pain Pain Assessment: No/denies pain    Home Living                      Prior Function            PT Goals (current goals can now be found in the care plan section) Progress towards PT goals: Goals met/education completed, patient discharged from PT    Frequency  PT Plan Current plan remains appropriate    Co-evaluation              AM-PAC PT "6 Clicks" Mobility   Outcome Measure  Help needed turning from your back to your side while in a flat bed without using bedrails?: None Help needed moving from lying on your back to sitting on the side of a flat bed without using bedrails?: None Help needed moving to and from a bed to a chair (including a wheelchair)?: None Help needed standing up from a chair using your arms (e.g., wheelchair or bedside chair)?: None Help needed to walk in hospital room?: None Help needed climbing 3-5 steps with a railing? : None 6 Click Score:  24    End of Session   Activity Tolerance: Patient tolerated treatment well Patient left: in chair;with call bell/phone within reach Nurse Communication: Mobility status PT Visit Diagnosis: Other abnormalities of gait and mobility (R26.89);Muscle weakness (generalized) (M62.81)     Time: 4573-3448 PT Time Calculation (min) (ACUTE ONLY): 18 min  Charges:  $Therapeutic Activity: 8-22 mins                     Rodney Romero, PT Acute Rehabilitation Services Pager: [phone] Office: [place] Rodney Romero 02/09/2020, 12:03 PM

## 2020-02-09 NOTE — Progress Notes (Signed)
PROGRESS NOTE  Rodney Romero [id number] DOB: September 10, 1946 DOA: 02/02/2020 PCP: [facility]  HPI/Recap of past 24 hours: [name] is a 74 y.o. male with history of hypertension hypothyroidism bipolar disorder, alcohol abuse presents to the ER because of abdominal pain.  Patient states he has been abdominal pain mostly in the right upper quadrant for the last 2 days.  Has some subjective feeling of fever chills and diaphoresis.  Denies chest pain shortness of breath.  Denies any vomiting or diarrhea. In the ED, RUQ showed features concerning for cholecystitis. Labs showed AST 176 ALT 152 creatinine 1.8 alkaline phosphatase 313 total bilirubin 9.7.  CBC shows WBC of 14.5 high sensitive troponin was 5 and 4 EKG shows normal sinus rhythm.  General surgery, GI consulted.  Patient admitted for further management.     Today, patient still with poor appetite, encourage to eat more.  Advance diet to full liquid earlier this morning, and now to soft diet and see if he tolerates it.  Patient still reports mild pain around drain, denies any nausea/vomiting, fever/chills, chest pain, shortness of breath.    Assessment/Plan: Principal Problem:   Abdominal pain Active Problems:   Elevated LFTs   Hypertension   Cholecystitis, acute   Acute cholecystitis versus cholangiocarcinoma s/p IR biliary drain placement with cholangiogram on 02/06/20 Currently afebrile, with resolved leukocytosis BC x2, NGTD CA 19-9 46, CEA 11.3, GGT 199 Right upper quadrant ultrasound with possible acute cholecystitis CT abdomen pelvis showed acute cholecystitis GI on board, s/p ERCP on 02/05/2020, unfortunately scope position was very difficult despite multiple maneuvers, not able to cannulate CBD. General surgery on board, recommend percutaneous cholecystostomy by IR S/P IR biliary drain placement with cholangiogram on 02/06/20 which showed proximal CBD obstructing lesion with  intrahepatic biliary ductal dilatation. Brush biopsy of proximal CBD lesion pending result IR recommend outpatient follow-up in 2 to 3 weeks post discharge for a cholangiogram Advance to soft diet Continue IV Unasyn Monitor closely  Hyperbilirubinemia T bili currently trended down Continue to trend  Sinus bradycardia Fluctuating Telemetry reported HR in 40s on 02/05/20, checked manually at bedside, HR in 50s, EKG showed sinus brady, HR 50s. Asymptomatic. Bedside continuous monitoring ECHO with EF 55-60%, no RWMA Continue telemetry  Hypokalemia Replace prn  Hypertension Continue IV hydralazine as needed  Hypothyroidism Free T4 1.21,  TSH 1.255 Continue Synthroid  Alcohol abuse Counseled against abuse CIWA protocol        Malnutrition Type:      Malnutrition Characteristics:      Nutrition Interventions:       Estimated body mass index is 19.27 kg/m as calculated from the following:   Height as of this encounter: 5\' 7"  (1.702 m).   Weight as of this encounter: 55.8 kg.     Code Status: Full  Family Communication: Discussed with wife on 02/09/20, out of town, but will be available to pick pt up on 02/10/20  Disposition Plan: Possibly Home 02/10/20 after pt tolerates his diet   Consultants:  GI  General surgery  IR  Procedures:  ERCP on 02/05/20  IR biliary drain placement on 02/06/20  Antimicrobials:  Unasyn  DVT prophylaxis: Lovenox   Objective: Vitals:   02/08/20 1308 02/08/20 2011 02/09/20 0419 02/09/20 1415  BP: (!) 166/88 (!) 154/85 (!) 154/87 132/85  Pulse: (!) 57 (!) 57 (!) 55 64  Resp: 17 16 14 18   Temp: 98.4 F (36.9 C) 98.7 F (37.1 C) 98.8  F (37.1 C) 98.5 F (36.9 C)  TempSrc: Oral Oral Oral Oral  SpO2: 99% 99% 98% 99%  Weight:      Height:        Intake/Output Summary (Last 24 hours) at 02/09/2020 1622 Last data filed at 02/09/2020 1418 Gross per 24 hour  Intake 1743.61 ml  Output 1290 ml  Net 453.61 ml   Filed  Weights   02/02/20 1146 02/02/20 2102 02/05/20 1019  Weight: 61.7 kg 55.8 kg 55.8 kg    Exam:  General: NAD   Cardiovascular: S1, S2 present  Respiratory: CTAB  Abdomen: Soft, +tender around biliary drain, nondistended, bowel sounds present  Musculoskeletal: No bilateral pedal edema noted  Skin: Normal  Psychiatry: Normal mood   Data Reviewed: CBC: Recent Labs  Lab 02/05/20 0201 02/06/20 0246 02/07/20 0327 02/08/20 0232 02/09/20 0155  WBC 12.0* 7.9 9.4 9.2 8.9  NEUTROABS 9.7* 6.5 7.0 6.8 6.0  HGB 12.5* 12.3* 11.8* 13.3 14.4  HCT 37.0* 36.2* 35.6* 39.8 42.5  MCV 71.6* 71.7* 72.2* 72.0* 70.6*  PLT 163 236 263 303 0000000   Basic Metabolic Panel: Recent Labs  Lab 02/02/20 1935 02/03/20 0325 02/05/20 0201 02/06/20 0246 02/07/20 0327 02/08/20 0232 02/09/20 0155  NA  --    < > 136 137 138 136 137  K  --    < > 3.3* 3.6 3.2* 3.7 3.6  CL  --    < > 103 105 107 105 103  CO2  --    < > 23 22 23 23 24   GLUCOSE  --    < > 80 130* 98 90 91  BUN  --    < > 7* 14 8 6* 7*  CREATININE  --    < > 0.83 0.77 0.71 0.71 0.86  CALCIUM  --    < > 8.4* 8.3* 8.2* 8.6* 9.1  MG 1.8  --   --   --   --   --   --    < > = values in this interval not displayed.   GFR: Estimated Creatinine Clearance: 61.3 mL/min (by C-G formula based on SCr of 0.86 mg/dL). Liver Function Tests: Recent Labs  Lab 02/05/20 0201 02/06/20 0246 02/07/20 0327 02/08/20 0232 02/09/20 0155  AST 19 13* 29 21 20   ALT 38 27 33 28 24  ALKPHOS 162* 157* 146* 155* 143*  BILITOT 3.0* 1.7* 1.4* 1.3* 1.4*  PROT 6.2* 5.9* 6.0* 6.5 6.8  ALBUMIN 2.5* 2.5* 2.7* 2.8* 3.0*   Recent Labs  Lab 02/04/20 0208  LIPASE 18   No results for input(s): AMMONIA in the last 168 hours. Coagulation Profile: Recent Labs  Lab 02/02/20 2143 02/03/20 0325 02/04/20 0808  INR 1.2 1.3* 1.2   Cardiac Enzymes: No results for input(s): CKTOTAL, CKMB, CKMBINDEX, TROPONINI in the last 168 hours. BNP (last 3 results) No results  for input(s): PROBNP in the last 8760 hours. HbA1C: No results for input(s): HGBA1C in the last 72 hours. CBG: Recent Labs  Lab 02/08/20 0750 02/08/20 1551 02/08/20 2007 02/09/20 0415 02/09/20 0813  GLUCAP 87 143* 93 85 108*   Lipid Profile: No results for input(s): CHOL, HDL, LDLCALC, TRIG, CHOLHDL, LDLDIRECT in the last 72 hours. Thyroid Function Tests: Recent Labs    02/07/20 0327  TSH 1.255  FREET4 1.21*   Anemia Panel: No results for input(s): VITAMINB12, FOLATE, FERRITIN, TIBC, IRON, RETICCTPCT in the last 72 hours. Urine analysis:    Component Value Date/Time   COLORURINE  AMBER (A) 02/02/2020 1830   APPEARANCEUR CLEAR 02/02/2020 1830   LABSPEC >1.046 (H) 02/02/2020 1830   PHURINE 6.0 02/02/2020 1830   GLUCOSEU NEGATIVE 02/02/2020 1830   HGBUR SMALL (A) 02/02/2020 1830   BILIRUBINUR SMALL (A) 02/02/2020 1830   KETONESUR NEGATIVE 02/02/2020 1830   PROTEINUR NEGATIVE 02/02/2020 1830   UROBILINOGEN 1.0 09/04/2015 2309   NITRITE NEGATIVE 02/02/2020 1830   LEUKOCYTESUR NEGATIVE 02/02/2020 1830   Sepsis Labs: @LABRCNTIP (procalcitonin:4,lacticidven:4)  ) Recent Results (from the past 240 hour(s))  Novel Coronavirus, NAA (Hosp order, Send-out to Ref Lab; TAT 18-24 hrs     Status: None   Collection Time: 02/02/20 11:04 AM   Specimen: Nasopharyngeal Swab; Respiratory  Result Value Ref Range Status   SARS-CoV-2, NAA NOT DETECTED NOT DETECTED Final    Comment: (NOTE) This nucleic acid amplification test was developed and its performance characteristics determined by [employer]. Nucleic acid amplification tests include RT-PCR and TMA. This test has not been FDA cleared or approved. This test has been authorized by FDA under an Emergency Use Authorization (EUA). This test is only authorized for the duration of time the declaration that circumstances exist justifying the authorization of the emergency use of in vitro diagnostic tests for detection of  SARS-CoV-2 virus and/or diagnosis of COVID-19 infection under section 564(b)(1) of the Act, 21 U.S.C. [id number]) (1), unless the authorization is terminated or revoked sooner. When diagnostic testing is negative, the possibility of a false negative result should be considered in the context of a patient's recent exposures and the presence of clinical signs and symptoms consistent with COVID-19. An individual without symptoms of COVID- 19 and who is not shedding SARS-CoV-2  virus would expect to have a negative (not detected) result in this assay. Performed At: [facility] [address] [place], [place] [id number] Rush Farmer MD [id number]    [place]  Final    Comment: Performed at [facility], [place] [address], [place], [place] [address]  Blood culture (routine x 2)     Status: None   Collection Time: 02/02/20  4:22 PM   Specimen: BLOOD  Result Value Ref Range Status   Specimen Description BLOOD LEFT ANTECUBITAL  Final   Special Requests   Final    BOTTLES DRAWN AEROBIC AND ANAEROBIC Blood Culture adequate volume   Culture   Final    NO GROWTH 5 DAYS Performed at [facility], [place] [address], [place], [place] [address]    Report Status 02/07/2020 FINAL  Final  Blood culture (routine x 2)     Status: None   Collection Time: 02/02/20  4:22 PM   Specimen: BLOOD  Result Value Ref Range Status   Specimen Description BLOOD RIGHT ANTECUBITAL  Final   Special Requests   Final    BOTTLES DRAWN AEROBIC AND ANAEROBIC Blood Culture results may not be optimal due to an excessive volume of blood received in culture bottles   Culture   Final    NO GROWTH 5 DAYS Performed at [facility], [place] [address] [place], [place] [address]    Report Status 02/07/2020 FINAL  Final  Respiratory Panel by RT PCR (Flu A&B, Covid) - Nasopharyngeal Swab     Status: None   Collection Time: 02/02/20  6:22 PM   Specimen: Nasopharyngeal Swab  Result  Value Ref Range Status   SARS Coronavirus 2 by RT PCR NEGATIVE NEGATIVE Final    Comment: (NOTE) SARS-CoV-2 target nucleic acids are  NOT DETECTED. The SARS-CoV-2 RNA is generally detectable in upper respiratoy specimens during the acute phase of infection. The lowest concentration of SARS-CoV-2 viral copies this assay can detect is 131 copies/mL. A negative result does not preclude SARS-Cov-2 infection and should not be used as the sole basis for treatment or other patient management decisions. A negative result may occur with  improper specimen collection/handling, submission of specimen other than nasopharyngeal swab, presence of viral mutation(s) within the areas targeted by this assay, and inadequate number of viral copies (<131 copies/mL). A negative result must be combined with clinical observations, patient history, and epidemiological information. The expected result is Negative. Fact Sheet for Patients:  PinkCheek.be Fact Sheet for Healthcare Providers:  GravelBags.it This test is not yet ap proved or cleared by the [place] FDA and  has been authorized for detection and/or diagnosis of SARS-CoV-2 by FDA under an Emergency Use Authorization (EUA). This EUA will remain  in effect (meaning this test can be used) for the duration of the COVID-19 declaration under Section 564(b)(1) of the Act, 21 U.S.C. section 360bbb-3(b)(1), unless the authorization is terminated or revoked sooner.    Influenza A by PCR NEGATIVE NEGATIVE Final   Influenza B by PCR NEGATIVE NEGATIVE Final    Comment: (NOTE) The Xpert Xpress SARS-CoV-2/FLU/RSV assay is intended as an aid in  the diagnosis of influenza from Nasopharyngeal swab specimens and  should not be used as a sole basis for treatment. Nasal washings and  aspirates are unacceptable for Xpert Xpress SARS-CoV-2/FLU/RSV  testing. Fact Sheet for  Patients: PinkCheek.be Fact Sheet for Healthcare Providers: GravelBags.it This test is not yet approved or cleared by the [place] FDA and  has been authorized for detection and/or diagnosis of SARS-CoV-2 by  FDA under an Emergency Use Authorization (EUA). This EUA will remain  in effect (meaning this test can be used) for the duration of the  Covid-19 declaration under Section 564(b)(1) of the Act, 21  U.S.C. section 360bbb-3(b)(1), unless the authorization is  terminated or revoked. Performed at [facility], [place] [address] [place], [place] [address]       Studies: IR INT EXT BILIARY DRAIN WITH CHOLANGIOGRAM  Result Date: 02/06/2020 INDICATION: Biliary obstruction. MRCP demonstrates proximal CBD lesion. Failed ERCP. EXAM: PERCUTANEOUS TRANSHEPATIC CHOLANGIOGRAM PERCUTANEOUS INTERNAL/EXTERNAL BILIARY DRAIN CATHETER PLACEMENT MEDICATIONS: Patient was already receiving adequate prophylactic antibiotic coverage as an inpatient. ANESTHESIA/SEDATION: Intravenous Fentanyl 138mcg and Versed 2mg  were administered as conscious sedation during continuous monitoring of the patient's level of consciousness and physiological / cardiorespiratory status by the radiology RN, with a total moderate sedation time of 38 minutes. PROCEDURE: Informed written consent was obtained from the patient after a thorough discussion of the procedural risks, benefits and alternatives. All questions were addressed. Maximal Sterile Barrier Technique was utilized including caps, mask, sterile gowns, sterile gloves, sterile drape, hand hygiene and skin antiseptic. A timeout was performed prior to the initiation of the procedure. Dilated intrahepatic biliary tree was identified on ultrasound and a peripheral left lobe duct was selected for approach. Skin and subcutaneous tissues down to the liver capsule infiltrated with 1% lidocaine. Under real-time ultrasound  guidance, a 21 gauge micropuncture needle was advanced into the duct. Small contrast injection under fluoroscopy confirmed appropriate positioning. A 018 guidewire would not advance. For this reason, percutaneous cholangiogram was performed through the needle. Proximal CBD high-grade stenosis was confirmed. A peripheral left bile duct was identified in from a more advantageous angle, 21 gauge trocar needle advanced  into the duct. 018 guidewire advanced centrally. A 5 French micropuncture dilator was placed. Catheter exchanged over an angled Glidewire for a 5 [place] Kumpe catheter, advanced to the distal CBD for additional cholangiographic images. The Kumpe the was then advanced into the distal duodenum and exchanged over an Amplatz wire for vascular dilator which facilitated placement of a 7 French long vascular sheath. Through this, brush biopsy x2 the proximal CBD lesion was obtained. Subsequently, the sheath was exchanged over Amplatz wire for 8.5 French internal/external biliary drain catheter, placed with sideholes spanning the CBD lesion, and holes in the distal duodenum. Confirmatory contrast injection under fluoroscopy performed. The catheter was then flushed with saline and secured externally with 0 Prolene suture and StatLock, placed to external drain bag to maximize biliary decompression. The patient tolerated the procedure well. FLUOROSCOPY TIME:   COMPLICATIONS: None immediate. FINDINGS: The percutaneous transhepatic cholangiogram confirms mildly dilated intrahepatic biliary tree with a tapered somewhat irregular short-segment narrowing in the proximal common duct just distal to the confluence. Brush biopsies of the stenotic region were obtained x2. 8.5 French internal/external biliary drainage catheter placed as above. IMPRESSION: 1. Proximal CBD obstructing lesion with intrahepatic biliary ductal dilatation. 2. Brush biopsy of proximal CBD lesion. 3. Technically successful internal/external biliary  drain catheter placement. Electronically Signed   By: Lucrezia Europe M.D.   On: 02/06/2020 13:49   IR ENDOLUMINAL BX OF BILIARY TREE  Result Date: 02/06/2020 INDICATION: Biliary obstruction. MRCP demonstrates proximal CBD lesion. Failed ERCP. EXAM: PERCUTANEOUS TRANSHEPATIC CHOLANGIOGRAM PERCUTANEOUS INTERNAL/EXTERNAL BILIARY DRAIN CATHETER PLACEMENT MEDICATIONS: Patient was already receiving adequate prophylactic antibiotic coverage as an inpatient. ANESTHESIA/SEDATION: Intravenous Fentanyl 131mcg and Versed 2mg  were administered as conscious sedation during continuous monitoring of the patient's level of consciousness and physiological / cardiorespiratory status by the radiology RN, with a total moderate sedation time of 38 minutes. PROCEDURE: Informed written consent was obtained from the patient after a thorough discussion of the procedural risks, benefits and alternatives. All questions were addressed. Maximal Sterile Barrier Technique was utilized including caps, mask, sterile gowns, sterile gloves, sterile drape, hand hygiene and skin antiseptic. A timeout was performed prior to the initiation of the procedure. Dilated intrahepatic biliary tree was identified on ultrasound and a peripheral left lobe duct was selected for approach. Skin and subcutaneous tissues down to the liver capsule infiltrated with 1% lidocaine. Under real-time ultrasound guidance, a 21 gauge micropuncture needle was advanced into the duct. Small contrast injection under fluoroscopy confirmed appropriate positioning. A 018 guidewire would not advance. For this reason, percutaneous cholangiogram was performed through the needle. Proximal CBD high-grade stenosis was confirmed. A peripheral left bile duct was identified in from a more advantageous angle, 21 gauge trocar needle advanced into the duct. 018 guidewire advanced centrally. A 5 French micropuncture dilator was placed. Catheter exchanged over an angled Glidewire for a 5 [place] Kumpe  catheter, advanced to the distal CBD for additional cholangiographic images. The Kumpe the was then advanced into the distal duodenum and exchanged over an Amplatz wire for vascular dilator which facilitated placement of a 7 French long vascular sheath. Through this, brush biopsy x2 the proximal CBD lesion was obtained. Subsequently, the sheath was exchanged over Amplatz wire for 8.5 French internal/external biliary drain catheter, placed with sideholes spanning the CBD lesion, and holes in the distal duodenum. Confirmatory contrast injection under fluoroscopy performed. The catheter was then flushed with saline and secured externally with 0 Prolene suture and StatLock, placed to external drain bag to maximize biliary decompression.  The patient tolerated the procedure well. FLUOROSCOPY TIME:  COMPLICATIONS: None immediate. FINDINGS: The percutaneous transhepatic cholangiogram confirms mildly dilated intrahepatic biliary tree with a tapered somewhat irregular short-segment narrowing in the proximal common duct just distal to the confluence. Brush biopsies of the stenotic region were obtained x2. 8.5 French internal/external biliary drainage catheter placed as above. IMPRESSION: 1. Proximal CBD obstructing lesion with intrahepatic biliary ductal dilatation. 2. Brush biopsy of proximal CBD lesion. 3. Technically successful internal/external biliary drain catheter placement. Electronically Signed   By: Lucrezia Europe M.D.   On: 02/06/2020 13:49    Scheduled Meds: . amLODipine  10 mg Oral Daily  . enoxaparin (LOVENOX) injection  40 mg Subcutaneous Q24H  . fluticasone  1 spray Each Nare Daily  . folic acid  1 mg Oral Daily  . levothyroxine  137 mcg Oral Q0600  . loratadine  10 mg Oral Daily  . mirtazapine  15 mg Oral QHS  . multivitamin with minerals  1 tablet Oral Daily  . pantoprazole  40 mg Oral Daily  . sodium chloride flush  3 mL Intravenous Once  . sodium chloride flush  5 mL Intracatheter Q8H  . thiamine   100 mg Oral Daily   Or  . thiamine  100 mg Intravenous Daily    Continuous Infusions: . ampicillin-sulbactam (UNASYN) IV 1.5 g (02/09/20 1500)     LOS: 7 days     Alma Friendly, MD [facility]  If 7PM-7AM, please contact night-coverage [email or web address] 02/09/2020, 4:22 PM

## 2020-02-09 NOTE — Plan of Care (Signed)

## 2020-02-09 NOTE — Plan of Care (Signed)
  Problem: Education: Goal: Knowledge of General Education information will improve Description Including pain rating scale, medication(s)/side effects and non-pharmacologic comfort measures Outcome: Progressing   

## 2020-02-09 NOTE — Progress Notes (Addendum)
Occupational Therapy Treatment and Discharge  Patient Details Name: Rodney Romero MRN: [id number] DOB: [birth year] Today's Date: 02/09/2020    History of present illness Pt is a 74 y/o with PMH of HTN, bipolar disorder, alcohol abuse presenting to ER due to abdominal pain x 2 days. S/P ERCP 02/05/20, workup for Acute cholecystitis versus cholangiocarcinoma versus Mirizzi syndrome. S/P biliary drain placement 02/06/20.    OT comments  Patient supine in bed and agreeable to OT.  Patient demonstrating toilet transfers and toileting with modified independence, pushing IV pole today.  He simulated LB ADLs with modified independence and simulated tub transfers with supervision using grab bars.  Discussed possible use of [place] for tub at dc, but pt reports if he cannot take tub baths he would wash up at the sink.  Encouraged OOB multiple times a day with nursing staff and patient agreeable. Based on performance today, no further OT needs identified and OT will sign off. If further needs are identified, please re-consult.    Follow Up Recommendations  No OT follow up;Supervision - Intermittent    Equipment Recommendations  None recommended by OT    Recommendations for Other Services      Precautions / Restrictions Precautions Precautions: Fall Restrictions Weight Bearing Restrictions: No       Mobility Bed Mobility Overal bed mobility: Modified Independent                Transfers Overall transfer level: Needs assistance Equipment used: None Transfers: Sit to/from Stand Sit to Stand: Modified independent (Device/Increase time)              Balance Overall balance assessment: Mild deficits observed, not formally tested                                         ADL either performed or assessed with clinical judgement   ADL Overall ADL's : Modified independent                     Lower Body Dressing: Modified independent;Sit to/from stand   Toilet  Transfer: Modified Independent;Ambulation Toilet Transfer Details (indicate cue type and reason): demonstrating ability to manage IV pole and complete transfers to elevated commode without assist  Toileting- Clothing Manipulation and Hygiene: Modified independent   Tub/ Shower Transfer: Supervision/safety;Tub transfer;Grab bars Tub/Shower Transfer Details (indicate cue type and reason): simulated in room, supervision for safety but has support at home; discussed use of basin bathing initally for safety as pt prefers tub showers (down in tub)  Functional mobility during ADLs: Modified independent(pushing IV pole )       Vision       Perception     Praxis      Cognition Arousal/Alertness: Awake/alert Behavior During Therapy: WFL for tasks assessed/performed Overall Cognitive Status: Within Functional Limits for tasks assessed                                          Exercises     Shoulder Instructions       General Comments      Pertinent Vitals/ Pain       Pain Assessment: No/denies pain  Home Living  Prior Functioning/Environment              Frequency  Min 2X/week        Progress Toward Goals  OT Goals(current goals can now be found in the care plan section)  Progress towards OT goals: Progressing toward goals     Plan Discharge plan remains appropriate;Frequency remains appropriate    Co-evaluation                 AM-PAC OT "6 Clicks" Daily Activity     Outcome Measure   Help from another person eating meals?: None Help from another person taking care of personal grooming?: None Help from another person toileting, which includes using toliet, bedpan, or urinal?: None Help from another person bathing (including washing, rinsing, drying)?: None Help from another person to put on and taking off regular upper body clothing?: None Help from another person to put on and  taking off regular lower body clothing?: None 6 Click Score: 24    End of Session    OT Visit Diagnosis: Other abnormalities of gait and mobility (R26.89);Pain   Activity Tolerance Patient tolerated treatment well   Patient Left in bed;with call bell/phone within reach;with bed alarm set   Nurse Communication Mobility status        Time: [id number] OT Time Calculation (min): 11 min  Charges: OT General Charges $OT Visit: 1 Visit OT Treatments $Self Care/Home Management : 8-22 mins  Jolaine Artist, [place] Pager [phone] Office [place] 02/09/2020, 9:47 AM

## 2020-02-09 NOTE — Progress Notes (Signed)
Supervising Physician: Corrie Mckusick  Patient Status:  [facility] - In-pt  Chief Complaint: RUQ pain  Subjective: 74 y.o. male inpatient. History of HTN, alcohol abuse presented to this facility with RUQ abdominal pain X 2 days found to have a total bilirbuin of 9.7 and  elevated AST and ALT with concern for acute cholecystosis. ERCP performed on 3.4.21 notes difficulty cannulation. IR placed a biliary drain on 3.5.21. IR placed a biliary drain on 3.5.21 with brushing of a CBD lesion. Patient alert and laying in bed, calm and comfortable. Denies any fevers, headache, chest pain, SOB, cough, abdominal pain, nausea, vomiting or bleeding.    Allergies: Patient has no known allergies.  Medications: Prior to Admission medications   Medication Sig Start Date End Date Taking? Authorizing Provider  amLODipine (NORVASC) 10 MG tablet Take 10 mg by mouth daily.   Yes [provider]  aspirin EC 81 MG tablet Take 81 mg by mouth daily.   Yes [provider]  fluticasone (FLONASE) 50 MCG/ACT nasal spray Place 1 spray into both nostrils daily.   Yes [provider]  galantamine (RAZADYNE ER) 8 MG 24 hr capsule Take 8 mg by mouth daily with breakfast.   Yes [provider]  levothyroxine (SYNTHROID, LEVOTHROID) 137 MCG tablet Take 137 mcg by mouth daily before breakfast.   Yes [provider]  loratadine (CLARITIN) 10 MG tablet Take 10 mg by mouth daily.   Yes [provider]  omeprazole (PRILOSEC) 20 MG capsule Take 20 mg by mouth daily.   Yes [provider]  enoxaparin (LOVENOX) 40 MG/0.4ML injection Inject 0.4 mLs (40 mg total) into the skin daily. Inject one syringe of Lovenox once a day for 10 days following surgery. Then resume one baby aspirin once a day. Patient not taking: Reported on 02/03/2020 06/28/18   Edmisten, Ok Anis, PA  methocarbamol (ROBAXIN) 500 MG tablet Take 1 tablet (500 mg total) by mouth every 6 (six) hours as needed  for muscle spasms. Patient not taking: Reported on 02/03/2020 06/28/18   Edmisten, Drue Dun L, PA  mirtazapine (REMERON) 15 MG tablet Take 15 mg by mouth at bedtime.    [provider]  oxyCODONE (OXY IR/ROXICODONE) 5 MG immediate release tablet Take 1-2 tablets (5-10 mg total) by mouth every 6 (six) hours as needed for moderate pain or severe pain. Patient not taking: Reported on 02/03/2020 06/28/18   Derl Barrow, PA  traZODone (DESYREL) 50 MG tablet Take 50 mg by mouth at bedtime as needed for sleep.    [provider]     Vital Signs: BP (!) 154/87 (BP Location: Left Arm)    Pulse (!) 55    Temp 98.8 F (37.1 C) (Oral)    Resp 14    Ht 5\' 7"  (1.702 m)    Wt 123 lb 0.3 oz (55.8 kg)    SpO2 98%    BMI 19.27 kg/m   Physical Exam Vitals and nursing note reviewed.  Constitutional:      Appearance: He is well-developed.  HENT:     Head: Normocephalic.  Pulmonary:     Effort: Pulmonary effort is normal.  Musculoskeletal:        General: Normal range of motion.     Cervical back: Normal range of motion.  Skin:    General: Skin is dry.     Comments: drain site is unremarkable with no erythema, tenderness or drainage noted. Suture and stat lock in place. Dressing is  clean dry and intact. 5 ml of  brown colored fluid noted in gravity bag. Drain is able to be flushed.   Neurological:     Mental Status: He is alert and oriented to person, place, and time.     Labs:  CBC: Recent Labs    02/06/20 0246 02/07/20 0327 02/08/20 0232 02/09/20 0155  WBC 7.9 9.4 9.2 8.9  HGB 12.3* 11.8* 13.3 14.4  HCT 36.2* 35.6* 39.8 42.5  PLT 236 263 303 370    COAGS: Recent Labs    02/02/20 2143 02/03/20 0325 02/04/20 0808  INR 1.2 1.3* 1.2    BMP: Recent Labs    02/06/20 0246 02/07/20 0327 02/08/20 0232 02/09/20 0155  NA 137 138 136 137  K 3.6 3.2* 3.7 3.6  CL 105 107 105 103  CO2 22 23 23 24   GLUCOSE 130* 98 90 91  BUN 14 8 6* 7*  CALCIUM 8.3* 8.2* 8.6* 9.1    CREATININE 0.77 0.71 0.71 0.86  GFRNONAA >60 >60 >60 >60  GFRAA >60 >60 >60 >60    LIVER FUNCTION TESTS: Recent Labs    02/06/20 0246 02/07/20 0327 02/08/20 0232 02/09/20 0155  BILITOT 1.7* 1.4* 1.3* 1.4*  AST 13* 29 21 20   ALT 27 33 28 24  ALKPHOS 157* 146* 155* 143*  PROT 5.9* 6.0* 6.5 6.8  ALBUMIN 2.5* 2.7* 2.8* 3.0*    Assessment and Plan: 74 y.o. male inpatient. History of HTN, alcohol abuse presented to this facility with RUQ abdominal pain X 2 days found to have a total bilirubin of 9.7 and  elevated AST and ALT with concern for acute cholecystosis. s/p ERCP on 3.4.21 with difficulty cannulation. IR placed a biliary drain on 3.5.21. IR placed a biliary drain on 3.5.21 with brushing of a CBD lesion. Per Epic output has been:   725 ml, 600 ml, 225 ml  Pertinent Imaging None since drain placement  Pertinent IR History 3.5.21 - Placement of biliary drain with brushing of CBD   Pertinent Allergies NKDA  Total bilirubin 1.4 < 1.3< 1.4< 1.7 < 3.0 All other  labs are within acceptable parameters. Patient is on subcutaneous prophylactic dose of lovenox. Patient is afebrile. Brushings still pending.  Per IR Attending (JW) okay to cap biliary drain at this time. Recommend patient be seen 2 to 3 week post discharge for a cholangiogram with possible upsize under conscious sedation.  Patient given gravity back for discharge purposes with verbal instructions on when to uncap drain and place to an external bag. Patient verbalized understanding and is in agreement with plan of care.  IR will continue to follow along. Further plans per Primary/ GI Team please call IR with questions or concerns.   Electronically Signed: Avel Peace, NP 02/09/2020, 10:56 AM   I spent a total of 15 Minutes at the the patient's bedside AND on the patient's hospital floor or unit, greater than 50% of which was counseling/coordinating care for Biliary drain.

## 2020-02-10 DIAGNOSIS — R1011 Right upper quadrant pain: Secondary | ICD-10-CM

## 2020-02-10 LAB — GLUCOSE, CAPILLARY
Glucose-Capillary: 122 mg/dL — ABNORMAL HIGH (ref 70–99)
Glucose-Capillary: 99 mg/dL (ref 70–99)

## 2020-02-10 LAB — HAPTOGLOBIN: Haptoglobin: 284 mg/dL (ref 34–355)

## 2020-02-10 MED ORDER — AMOXICILLIN-POT CLAVULANATE 875-125 MG PO TABS: 1.0000 | ORAL_TABLET | Freq: Two times a day (BID) | ORAL | 0 refills | Status: DC

## 2020-02-10 MED ORDER — AMOXICILLIN-POT CLAVULANATE 875-125 MG PO TABS: 1.0000 | ORAL_TABLET | Freq: Two times a day (BID) | ORAL | 0 refills | Status: AC

## 2020-02-10 NOTE — Discharge Summary (Signed)
Discharge Summary  Rodney Romero [id number] DOB: 1946-09-21  PCP: [facility]  Admit date: 02/02/2020 Discharge date: 02/10/2020  Time spent: 40 mins  Recommendations for Outpatient Follow-up:  1. PCP in 1 week 2. IR in 2-3 weeks to follow up on biliary drain 3. General surgery  Discharge Diagnoses:  Active Hospital Problems   Diagnosis Date Noted  . Abdominal pain 02/02/2020  . Cholecystitis, acute   . Elevated LFTs 02/02/2020  . Hypertension 02/02/2020    Resolved Hospital Problems  No resolved problems to display.    Discharge Condition: Stable  Diet recommendation: As tolerated  Vitals:   02/09/20 2103 02/10/20 0525  BP: 137/85 (!) 143/99  Pulse: 62 62  Resp: 18 18  Temp: 98.6 F (37 C) 98.8 F (37.1 C)  SpO2: 98% 99%    History of present illness:  Rodney Romero a 74 y.o.malewithhistory of hypertension hypothyroidism bipolar disorder, alcohol abuse presents to the ER because of abdominal pain. Patient states he has been abdominal pain mostly in the right upper quadrant for the last 2 days. Has some subjective feeling of fever chills and diaphoresis. Denies chest pain shortness of breath. Denies any vomiting or diarrhea. In the ED, RUQ showed features concerning for cholecystitis. Labs showed AST 176 ALT 152 creatinine 1.8 alkaline phosphatase 313 total bilirubin 9.7. CBC shows WBC of 14.5 high sensitive troponin was 5 and 4 EKG shows normal sinus rhythm.  General surgery, GI consulted.  Patient admitted for further management.    Today, pt denies any new complaints, was able to tolerate his diet. Denies any abdominal pain, N/V, fever/chills, chest pain, SOB. Stable to d/c from a gen surg and IR standpoint. Pt advised to follow up closely with his appointments and seek medical care if worsening abd pain, fever/chills  Hospital Course:  Principal Problem:   Abdominal pain Active Problems:   Elevated LFTs   Hypertension  Cholecystitis, acute   Acute cholecystitis versus cholangiocarcinoma s/p IR biliary drain placement with cholangiogram on 02/06/20 Currently afebrile, with resolved leukocytosis BC x2, NGTD CA 19-9 46, CEA 11.3, GGT 199 Right upper quadrant ultrasound with possible acute cholecystitis CT abdomen pelvis showed acute cholecystitis GI on board, s/p ERCP on 02/05/2020, unfortunately scope position was very difficult despite multiple maneuvers, not able to cannulate CBD. S/P IR biliary drain placement with cholangiogram on 02/06/20 which showed proximal CBD obstructing lesion with intrahepatic biliary ductal dilatation. Brush biopsy of proximal CBD lesion negative for malignancy IR recommend outpatient follow-up in 2 to 3 weeks post discharge for a cholangiogram General surgery recommends referral to tertiary center as an outpt for further planning/coordination of his care due to complexity S/P IV Unasyn, will switch to PO Augmentin for a total of 14 days of AB  Hyperbilirubinemia T bili currently trended down Continue to trend  Sinus bradycardia Resolved Asymptomatic ECHO with EF 55-60%, no RWMA  Hypertension Continue home regimen  Hypothyroidism Free T4 1.21,  TSH 1.255 Continue Synthroid  Alcohol/tobacco abuse Counseled against abuse CIWA protocol        Malnutrition Type:      Malnutrition Characteristics:      Nutrition Interventions:      Estimated body mass index is 19.27 kg/m as calculated from the following:   Height as of this encounter: 5\' 7"  (1.702 m).   Weight as of this encounter: 55.8 kg.    Procedures:  ERCP on 02/05/20  IR biliary drain placement on 02/06/20  Consultations:  GI  IR  Gen surg  Discharge Exam: BP (!) 143/99 (BP Location: Right Arm)   Pulse 62   Temp 98.8 F (37.1 C) (Oral)   Resp 18   Ht 5\' 7"  (1.702 m)   Wt 55.8 kg   SpO2 99%   BMI 19.27 kg/m   General: NAD Cardiovascular: S1, S2 present Respiratory:  CTAB  Discharge Instructions You were cared for by a hospitalist during your hospital stay. If you have any questions about your discharge medications or the care you received while you were in the hospital after you are discharged, you can call the unit and asked to speak with the hospitalist on call if the hospitalist that took care of you is not available. Once you are discharged, your primary care physician will handle any further medical issues. Please note that NO REFILLS for any discharge medications will be authorized once you are discharged, as it is imperative that you return to your primary care physician (or establish a relationship with a primary care physician if you do not have one) for your aftercare needs so that they can reassess your need for medications and monitor your lab values.  Discharge Instructions    Diet - low sodium heart healthy   Complete by: As directed    Increase activity slowly   Complete by: As directed      Allergies as of 02/10/2020   No Known Allergies     Medication List    STOP taking these medications   enoxaparin 40 MG/0.4ML injection Commonly known as: LOVENOX   methocarbamol 500 MG tablet Commonly known as: ROBAXIN   oxyCODONE 5 MG immediate release tablet Commonly known as: Oxy IR/ROXICODONE     TAKE these medications   amLODipine 10 MG tablet Commonly known as: NORVASC Take 10 mg by mouth daily.   amoxicillin-clavulanate 875-125 MG tablet Commonly known as: Augmentin Take 1 tablet by mouth 2 (two) times daily for 7 days.   aspirin EC 81 MG tablet Take 81 mg by mouth daily.   fluticasone 50 MCG/ACT nasal spray Commonly known as: FLONASE Place 1 spray into both nostrils daily.   galantamine 8 MG 24 hr capsule Commonly known as: RAZADYNE ER Take 8 mg by mouth daily with breakfast.   levothyroxine 137 MCG tablet Commonly known as: SYNTHROID Take 137 mcg by mouth daily before breakfast.   loratadine 10 MG tablet Commonly  known as: CLARITIN Take 10 mg by mouth daily.   mirtazapine 15 MG tablet Commonly known as: REMERON Take 15 mg by mouth at bedtime.   omeprazole 20 MG capsule Commonly known as: PRILOSEC Take 20 mg by mouth daily.   traZODone 50 MG tablet Commonly known as: DESYREL Take 50 mg by mouth at bedtime as needed for sleep.      No Known Allergies Follow-up [facility]. Schedule an appointment as soon as possible for a visit in 1 week(s).   Specialty: General Practice Contact information: [address] [place] [address] [phone]        Surgery, [place] Follow up.   Specialty: General Surgery Why: They should call you for an appointment to follow up on your gall bladder Contact information: [place] [address] [phone]        Corrie Mckusick, DO. Schedule an appointment as soon as possible for a visit in 2 week(s).   Specialties: Interventional Radiology, Radiology Why: They will set up an appointment for you in  2-3 weeks about your stomach drain. Call if you dont hear from them in 1 week Contact information: [address] [place] [address] [phone]            The results of significant diagnostics from this hospitalization (including imaging, microbiology, ancillary and laboratory) are listed below for reference.    Significant Diagnostic Studies: CT ABDOMEN PELVIS W CONTRAST  Result Date: 02/02/2020 CLINICAL DATA:  Clinical suspicion for abdominal abscess or infection. Possible cholangitis or liver infection or gallbladder disease. Patient reports upper abdominal pain since yesterday with 1 episode of vomiting. EXAM: CT ABDOMEN AND PELVIS WITH CONTRAST TECHNIQUE: Multidetector CT imaging of the abdomen and pelvis was performed using the standard protocol following bolus administration of intravenous contrast. Automatic exposure control utilized. CONTRAST:  144mL OMNIPAQUE  IOHEXOL 300 MG/ML  SOLN COMPARISON:  December 18, 2013. Ultrasonography performed earlier on the same date. FINDINGS: Lower chest: Normal heart size. Mild coronary and thoracic aorta atherosclerotic calcification. Minimal subpleural atelectasis. Hepatobiliary: Abnormal 4.3 cm gallbladder dilatation with abnormal 9 mm gallbladder wall thickening and moderate amount of paracholecystic free fluid that extends into the right paracolic gutter and pelvis. Mild intrahepatic biliary duct dilatation. Normal 5 mm caliber of the common bile duct with distal tapering. No calcified gallstone. Upper limits of normal hepatic size with normal smooth hepatic contours. A tiny 3 mm hypoattenuation in the medial left hemiliver, likely a benign cyst or hemangioma based on size criteria in a patient without known malignancy; no further imaging is necessary. Pancreas: Normal pancreatic parenchyma and main pancreatic duct. Possible pancreatic divisum, series 3 images 35 and 36. No evidence for acute or chronic pancreatitis. Spleen: Normal splenic enhancement and contours. Small spleen measuring 4.4 x 7.5 by 7.0 cm. Adrenals/Urinary Tract: Normal bilateral adrenal glands and ureters. Focal wedge-shaped scarring with hypoenhancement in both renal upper poles. A multi loculated right renal lower pole cyst with multiple septations and course mural calcification measuring 3.9 by 3.3 by 2.7 cm; increased in size from 2.0 by 2.9 by 2.3 cm in 2015. Stomach/Bowel: Nonobstructed small bowel that is mostly decompressed. Mild fluid-distension and mild mucosal thickening of the duodenal bulb and C-loop, likely a focal ileus and secondary inflammatory change from the adjacent gallbladder fossa. Normal appendix, coronal 32. Paucity of stool. No apparent colorectal wall thickening. Vascular/Lymphatic: Aorto bi-iliac calcified atherosclerosis without and abdominal aortic aneurysm. Reproductive: Prostatomegaly with mass effect on the posterior urinary  bladder wall. Focal 7 mm fluid distension of the prostatic urethra, likely a small urethrocele. Other: Small left direct inguinal herniation of fat without strangulation or incarceration. Musculoskeletal: Mild skeletal degenerative change. IMPRESSION: Redemonstration of an acute cholecystitis with abnormal gallbladder dilatation, wall thickening, and free fluid in the abdomen and pelvis. Ileus pattern in the proximal duodenum and likely secondary mild duodenitis. Mild intrahepatic biliary duct dilatation. Normal 5 mm caliber of the common bile duct with distal tapering. No calcified gallstones or calcified choledocholithiasis. Given the noncalcified stones or sludge on recent ultrasonography, further evaluation with MRCP or ERCP could be considered. Possible pancreatic divisum. No acute pancreatitis. A Bosniak IIF right renal lower pole cyst increased in size since 2015 measuring up to 3.9 cm. Surveillance ultrasonography recommended in 6-9 months. Thoracoabdominal aortic calcified atherosclerosis. Mild coronary calcification. Prostatomegaly with small urethrocele. Electronically Signed   By: Revonda Humphrey   On: 02/02/2020 18:33   MR ABDOMEN MRCP WO CONTRAST  Result Date: 02/03/2020 CLINICAL DATA:  Cholelithiasis. EXAM: MRI ABDOMEN WITHOUT CONTRAST  (INCLUDING  MRCP) TECHNIQUE: Multiplanar multisequence MR imaging of the abdomen was performed. Heavily T2-weighted images of the biliary and pancreatic ducts were obtained, and three-dimensional MRCP images were rendered by post processing. COMPARISON:  CT 02/02/2020 FINDINGS: Lower chest:  Lung bases are clear. Hepatobiliary: Mild intrahepatic biliary duct dilatation. The common hepatic duct and common bile duct are normal caliber. There is a gap between the extrahepatic bile ducts and the mid common hepatic duct measuring 8 mm as seen on MRCP image 08/1029. Also appreciated on image 37/26. There is fullness in the neck of the gallbladder (image 9/21). No filling  defect within the common bile duct to suggest choledocholithiasis. Common bile duct measures upper limits of normal at 6 mm. There is marked gallbladder wall thickening and pericholecystic fluid. Gallbladder wall measures up to 8 mm. No clear stones within the gallbladder. Extensive inflammation fluid surrounding the gallbladder fossa. Fluid extends into Morrison's pouch and along the RIGHT pericolic gutter. Pancreas: Normal pancreatic parenchymal intensity. No ductal dilatation or inflammation. No variant ductal anatomy. Spleen: Normal spleen. Adrenals/urinary tract: Adrenal glands normal. Multilobulated cystic complex in the lower pole RIGHT kidney measures 3.6 cm. Intervening cyst walls are thin. No complexity. Stomach/Bowel: Stomach and limited of the small bowel is unremarkable Vascular/Lymphatic: Abdominal aortic normal caliber. No retroperitoneal periportal lymphadenopathy. Musculoskeletal: No aggressive osseous lesion IMPRESSION: 1. No choledocholithiasis. 2. Severe inflammation surrounding gallbladder fossa with gallbladder wall thickening suggests acute cholecystitis. 3. Interruption of the common hepatic duct at the level of the extrahepatic bile ducts. This may relate to edema within the neck of the gallbladder affecting the common hepatic duct. Conversely findings could represent cholangitis or cholangiocarcinoma secondarily obstructing the neck of the gallbladder. No gallstones present to explain cholecystitis. Minimal intrahepatic duct dilatation currently. ERCP may be warranted to evaluate the common hepatic duct. 4. Mild intrahepatic duct dilatation. These results will be called to the ordering clinician or representative by the Radiologist Assistant, and communication documented in the PACS or zVision Dashboard. Electronically Signed   By: Suzy Bouchard M.D.   On: 02/03/2020 13:38   MR 3D Recon At Scanner  Result Date: 02/03/2020 CLINICAL DATA:  Cholelithiasis. EXAM: MRI ABDOMEN WITHOUT  CONTRAST  (INCLUDING MRCP) TECHNIQUE: Multiplanar multisequence MR imaging of the abdomen was performed. Heavily T2-weighted images of the biliary and pancreatic ducts were obtained, and three-dimensional MRCP images were rendered by post processing. COMPARISON:  CT 02/02/2020 FINDINGS: Lower chest:  Lung bases are clear. Hepatobiliary: Mild intrahepatic biliary duct dilatation. The common hepatic duct and common bile duct are normal caliber. There is a gap between the extrahepatic bile ducts and the mid common hepatic duct measuring 8 mm as seen on MRCP image 08/1029. Also appreciated on image 37/26. There is fullness in the neck of the gallbladder (image 9/21). No filling defect within the common bile duct to suggest choledocholithiasis. Common bile duct measures upper limits of normal at 6 mm. There is marked gallbladder wall thickening and pericholecystic fluid. Gallbladder wall measures up to 8 mm. No clear stones within the gallbladder. Extensive inflammation fluid surrounding the gallbladder fossa. Fluid extends into Morrison's pouch and along the RIGHT pericolic gutter. Pancreas: Normal pancreatic parenchymal intensity. No ductal dilatation or inflammation. No variant ductal anatomy. Spleen: Normal spleen. Adrenals/urinary tract: Adrenal glands normal. Multilobulated cystic complex in the lower pole RIGHT kidney measures 3.6 cm. Intervening cyst walls are thin. No complexity. Stomach/Bowel: Stomach and limited of the small bowel is unremarkable Vascular/Lymphatic: Abdominal aortic normal caliber. No retroperitoneal periportal lymphadenopathy. Musculoskeletal:  No aggressive osseous lesion IMPRESSION: 1. No choledocholithiasis. 2. Severe inflammation surrounding gallbladder fossa with gallbladder wall thickening suggests acute cholecystitis. 3. Interruption of the common hepatic duct at the level of the extrahepatic bile ducts. This may relate to edema within the neck of the gallbladder affecting the common  hepatic duct. Conversely findings could represent cholangitis or cholangiocarcinoma secondarily obstructing the neck of the gallbladder. No gallstones present to explain cholecystitis. Minimal intrahepatic duct dilatation currently. ERCP may be warranted to evaluate the common hepatic duct. 4. Mild intrahepatic duct dilatation. These results will be called to the ordering clinician or representative by the Radiologist Assistant, and communication documented in the PACS or zVision Dashboard. Electronically Signed   By: Suzy Bouchard M.D.   On: 02/03/2020 13:38   US Abdomen Limited  Result Date: 02/02/2020 CLINICAL DATA:  Right upper quadrant pain EXAM: ULTRASOUND ABDOMEN LIMITED RIGHT UPPER QUADRANT COMPARISON:  None. FINDINGS: Gallbladder: Sludge and probable small calculi. Wall thickening measuring 14 mm. There is small volume pericholecystic fluid. Sonographic Percell Miller sign could not be evaluated due to presence of pain medication. Common bile duct: Diameter: 3.1 mm, normal. Liver: No focal lesion identified. Within normal limits in parenchymal echogenicity. Portal vein is patent on color Doppler imaging with normal direction of blood flow towards the liver. Other: None. IMPRESSION: Sonographic evidence of acute cholecystitis. These results were called by telephone at the time of interpretation on 02/02/2020 at 4:27 pm to provider MATTHEW TRIFAN , who verbally acknowledged these results. Electronically Signed   By: Macy Mis M.D.   On: 02/02/2020 16:29   DG Chest Portable 1 View  Result Date: 02/02/2020 CLINICAL DATA:  74 year old male with fever and tachycardia. EXAM: PORTABLE CHEST 1 VIEW COMPARISON:  Chest radiograph dated 06/25/2018. FINDINGS: No focal consolidation, pleural effusion, pneumothorax. The cardiac silhouette is within limits. Atherosclerotic calcification of the aorta. No acute osseous pathology. IMPRESSION: No acute cardiopulmonary process. Electronically Signed   By: Anner Crete  M.D.   On: 02/02/2020 19:30   DG C-Arm 1-60 Min-No Report  Result Date: 02/05/2020 Fluoroscopy was utilized by the requesting physician.  No radiographic interpretation.   ECHOCARDIOGRAM COMPLETE  Result Date: 02/07/2020    ECHOCARDIOGRAM REPORT   Patient Name:   Rodney Romero Date of Exam: 02/07/2020 Medical Rec #:  [id number]        Height:       67.0 in Accession #:    [id number]       Weight:       123.0 lb Date of Birth:  1946-08-06        BSA:          1.645 m Patient Age:    74 years         BP:           129/77 mmHg Patient Gender: M                HR:           68 bpm. Exam Location:  Inpatient Procedure: 2D Echo, Cardiac Doppler and Color Doppler Indications:    Abnormal ECG 794.31/R94.31  History:        Patient has no prior history of Echocardiogram examinations.                 Risk Factors:Current Smoker and Hypertension.  Sonographer:    Clayton Lefort RDCS (AE) Referring Phys: [id number] [place]  Sonographer Comments: Technically challenging study due to limited acoustic windows  and no subcostal window. Windows limited by large bandage over drainage tube in abdomen. Bandage large enough to limit parasternal and apical windows. IMPRESSIONS  1. Left ventricular ejection fraction, by estimation, is 55 to 60%. The left ventricle has normal function. The left ventricle has no regional wall motion abnormalities. Left ventricular diastolic parameters were normal.  2. Right ventricular systolic function is normal. The right ventricular size is normal. There is normal pulmonary artery systolic pressure.  3. The mitral valve is myxomatous. Mild mitral valve regurgitation.  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  5. The aortic valve is normal in structure and function. Aortic valve regurgitation is not visualized. Mild aortic valve sclerosis is present, with no evidence of aortic valve stenosis. FINDINGS  Left Ventricle: Left ventricular ejection fraction, by estimation, is 55 to 60%. The left  ventricle has normal function. The left ventricle has no regional wall motion abnormalities. The left ventricular internal cavity size was normal in size. There is  no left ventricular hypertrophy. Left ventricular diastolic parameters were normal. Normal left ventricular filling pressure. Right Ventricle: The right ventricular size is normal. No increase in right ventricular wall thickness. Right ventricular systolic function is normal. There is normal pulmonary artery systolic pressure. The tricuspid regurgitant velocity is 1.86 m/s, and  with an assumed right atrial pressure of 3 mmHg, the estimated right ventricular systolic pressure is 123XX123 mmHg. Left Atrium: Left atrial size was normal in size. Right Atrium: Right atrial size was normal in size. Pericardium: There is no evidence of pericardial effusion. Mitral Valve: The mitral valve is myxomatous. Mild mitral valve regurgitation. Tricuspid Valve: The tricuspid valve is myxomatous. Tricuspid valve regurgitation is trivial. Aortic Valve: The aortic valve is normal in structure and function. Aortic valve regurgitation is not visualized. Mild aortic valve sclerosis is present, with no evidence of aortic valve stenosis. Aortic valve mean gradient measures 3.0 mmHg. Aortic valve peak gradient measures 6.4 mmHg. Aortic valve area, by VTI measures 1.94 cm. Pulmonic Valve: The pulmonic valve was not well visualized. Pulmonic valve regurgitation is not visualized. Aorta: The aortic root and ascending aorta are structurally normal, with no evidence of dilitation. IAS/Shunts: The interatrial septum was not assessed.  LEFT VENTRICLE PLAX 2D LVIDd:         4.10 cm LV PW:         1.20 cm LV IVS:        1.10 cm LVOT diam:     1.90 cm LV SV:         65 LV SV Index:   40 LVOT Area:     2.84 cm  RIGHT VENTRICLE RV Basal diam:  3.00 cm RV S prime:     11.50 cm/s TAPSE (M-mode): 2.3 cm LEFT ATRIUM           Index       RIGHT ATRIUM           Index LA Vol (A2C): 45.5 ml 27.66  ml/m RA Area:     16.20 cm LA Vol (A4C): 32.5 ml 19.76 ml/m RA Volume:   43.20 ml  26.26 ml/m  AORTIC VALVE AV Area (Vmax):    2.39 cm AV Area (Vmean):   2.02 cm AV Area (VTI):     1.94 cm AV Vmax:           126.00 cm/s AV Vmean:          86.800 cm/s AV VTI:  0.337 m AV Peak Grad:      6.4 mmHg AV Mean Grad:      3.0 mmHg LVOT Vmax:         106.00 cm/s LVOT Vmean:        61.900 cm/s LVOT VTI:          0.230 m LVOT/AV VTI ratio: 0.68  AORTA Ao Root diam: 2.70 cm Ao Asc diam:  3.10 cm TRICUSPID VALVE TR Peak grad:   13.8 mmHg TR Vmax:        186.00 cm/s  SHUNTS Systemic VTI:  0.23 m Systemic Diam: 1.90 cm Dani Gobble Croitoru MD Electronically signed by Sanda Klein MD Signature Date/Time: 02/07/2020/3:49:14 PM    Final    IR INT EXT BILIARY DRAIN WITH CHOLANGIOGRAM  Result Date: 02/06/2020 INDICATION: Biliary obstruction. MRCP demonstrates proximal CBD lesion. Failed ERCP. EXAM: PERCUTANEOUS TRANSHEPATIC CHOLANGIOGRAM PERCUTANEOUS INTERNAL/EXTERNAL BILIARY DRAIN CATHETER PLACEMENT MEDICATIONS: Patient was already receiving adequate prophylactic antibiotic coverage as an inpatient. ANESTHESIA/SEDATION: Intravenous Fentanyl 182mcg and Versed 2mg  were administered as conscious sedation during continuous monitoring of the patient's level of consciousness and physiological / cardiorespiratory status by the radiology RN, with a total moderate sedation time of 38 minutes. PROCEDURE: Informed written consent was obtained from the patient after a thorough discussion of the procedural risks, benefits and alternatives. All questions were addressed. Maximal Sterile Barrier Technique was utilized including caps, mask, sterile gowns, sterile gloves, sterile drape, hand hygiene and skin antiseptic. A timeout was performed prior to the initiation of the procedure. Dilated intrahepatic biliary tree was identified on ultrasound and a peripheral left lobe duct was selected for approach. Skin and subcutaneous tissues down  to the liver capsule infiltrated with 1% lidocaine. Under real-time ultrasound guidance, a 21 gauge micropuncture needle was advanced into the duct. Small contrast injection under fluoroscopy confirmed appropriate positioning. A 018 guidewire would not advance. For this reason, percutaneous cholangiogram was performed through the needle. Proximal CBD high-grade stenosis was confirmed. A peripheral left bile duct was identified in from a more advantageous angle, 21 gauge trocar needle advanced into the duct. 018 guidewire advanced centrally. A 5 French micropuncture dilator was placed. Catheter exchanged over an angled Glidewire for a 5 [place] Kumpe catheter, advanced to the distal CBD for additional cholangiographic images. The Kumpe the was then advanced into the distal duodenum and exchanged over an Amplatz wire for vascular dilator which facilitated placement of a 7 French long vascular sheath. Through this, brush biopsy x2 the proximal CBD lesion was obtained. Subsequently, the sheath was exchanged over Amplatz wire for 8.5 French internal/external biliary drain catheter, placed with sideholes spanning the CBD lesion, and holes in the distal duodenum. Confirmatory contrast injection under fluoroscopy performed. The catheter was then flushed with saline and secured externally with 0 Prolene suture and StatLock, placed to external drain bag to maximize biliary decompression. The patient tolerated the procedure well. FLUOROSCOPY TIME: COMPLICATIONS: None immediate. FINDINGS: The percutaneous transhepatic cholangiogram confirms mildly dilated intrahepatic biliary tree with a tapered somewhat irregular short-segment narrowing in the proximal common duct just distal to the confluence. Brush biopsies of the stenotic region were obtained x2. 8.5 French internal/external biliary drainage catheter placed as above. IMPRESSION: 1. Proximal CBD obstructing lesion with intrahepatic biliary ductal dilatation. 2. Brush biopsy  of proximal CBD lesion. 3. Technically successful internal/external biliary drain catheter placement. Electronically Signed   By: Lucrezia Europe M.D.   On: 02/06/2020 13:49   IR ENDOLUMINAL BX OF BILIARY TREE  Result Date:  02/06/2020 INDICATION: Biliary obstruction. MRCP demonstrates proximal CBD lesion. Failed ERCP. EXAM: PERCUTANEOUS TRANSHEPATIC CHOLANGIOGRAM PERCUTANEOUS INTERNAL/EXTERNAL BILIARY DRAIN CATHETER PLACEMENT MEDICATIONS: Patient was already receiving adequate prophylactic antibiotic coverage as an inpatient. ANESTHESIA/SEDATION: Intravenous Fentanyl 147mcg and Versed 2mg  were administered as conscious sedation during continuous monitoring of the patient's level of consciousness and physiological / cardiorespiratory status by the radiology RN, with a total moderate sedation time of 38 minutes. PROCEDURE: Informed written consent was obtained from the patient after a thorough discussion of the procedural risks, benefits and alternatives. All questions were addressed. Maximal Sterile Barrier Technique was utilized including caps, mask, sterile gowns, sterile gloves, sterile drape, hand hygiene and skin antiseptic. A timeout was performed prior to the initiation of the procedure. Dilated intrahepatic biliary tree was identified on ultrasound and a peripheral left lobe duct was selected for approach. Skin and subcutaneous tissues down to the liver capsule infiltrated with 1% lidocaine. Under real-time ultrasound guidance, a 21 gauge micropuncture needle was advanced into the duct. Small contrast injection under fluoroscopy confirmed appropriate positioning. A 018 guidewire would not advance. For this reason, percutaneous cholangiogram was performed through the needle. Proximal CBD high-grade stenosis was confirmed. A peripheral left bile duct was identified in from a more advantageous angle, 21 gauge trocar needle advanced into the duct. 018 guidewire advanced centrally. A 5 French micropuncture dilator  was placed. Catheter exchanged over an angled Glidewire for a 5 [place] Kumpe catheter, advanced to the distal CBD for additional cholangiographic images. The Kumpe the was then advanced into the distal duodenum and exchanged over an Amplatz wire for vascular dilator which facilitated placement of a 7 French long vascular sheath. Through this, brush biopsy x2 the proximal CBD lesion was obtained. Subsequently, the sheath was exchanged over Amplatz wire for 8.5 French internal/external biliary drain catheter, placed with sideholes spanning the CBD lesion, and holes in the distal duodenum. Confirmatory contrast injection under fluoroscopy performed. The catheter was then flushed with saline and secured externally with 0 Prolene suture and StatLock, placed to external drain bag to maximize biliary decompression. The patient tolerated the procedure well. FLUOROSCOPY TIME:  COMPLICATIONS: None immediate. FINDINGS: The percutaneous transhepatic cholangiogram confirms mildly dilated intrahepatic biliary tree with a tapered somewhat irregular short-segment narrowing in the proximal common duct just distal to the confluence. Brush biopsies of the stenotic region were obtained x2. 8.5 French internal/external biliary drainage catheter placed as above. IMPRESSION: 1. Proximal CBD obstructing lesion with intrahepatic biliary ductal dilatation. 2. Brush biopsy of proximal CBD lesion. 3. Technically successful internal/external biliary drain catheter placement. Electronically Signed   By: Lucrezia Europe M.D.   On: 02/06/2020 13:49    Microbiology: Recent Results (from the past 240 hour(s))  Novel Coronavirus, NAA (Hosp order, Send-out to Ref Lab; TAT 18-24 hrs     Status: None   Collection Time: 02/02/20 11:04 AM   Specimen: Nasopharyngeal Swab; Respiratory  Result Value Ref Range Status   SARS-CoV-2, NAA NOT DETECTED NOT DETECTED Final    Comment: (NOTE) This nucleic acid amplification test was developed and  its performance characteristics determined by [employer]. Nucleic acid amplification tests include RT-PCR and TMA. This test has not been FDA cleared or approved. This test has been authorized by FDA under an Emergency Use Authorization (EUA). This test is only authorized for the duration of time the declaration that circumstances exist justifying the authorization of the emergency use of in vitro diagnostic tests for detection of SARS-CoV-2 virus and/or diagnosis of COVID-19 infection under  section 564(b)(1) of the Act, 21 U.S.C. [id number]) (1), unless the authorization is terminated or revoked sooner. When diagnostic testing is negative, the possibility of a false negative result should be considered in the context of a patient's recent exposures and the presence of clinical signs and symptoms consistent with COVID-19. An individual without symptoms of COVID- 19 and who is not shedding SARS-CoV-2  virus would expect to have a negative (not detected) result in this assay. Performed At: [facility] [address] [place], [place] [id number] Rush Farmer MD [id number]    [place]  Final    Comment: Performed at [facility], [place] [address], [place], [place] [address]  Blood culture (routine x 2)     Status: None   Collection Time: 02/02/20  4:22 PM   Specimen: BLOOD  Result Value Ref Range Status   Specimen Description BLOOD LEFT ANTECUBITAL  Final   Special Requests   Final    BOTTLES DRAWN AEROBIC AND ANAEROBIC Blood Culture adequate volume   Culture   Final    NO GROWTH 5 DAYS Performed at [facility], [place] [address], [place], [place] [address]    Report Status 02/07/2020 FINAL  Final  Blood culture (routine x 2)     Status: None   Collection Time: 02/02/20  4:22 PM   Specimen: BLOOD  Result Value Ref Range Status   Specimen Description BLOOD RIGHT ANTECUBITAL  Final   Special Requests   Final    BOTTLES DRAWN  AEROBIC AND ANAEROBIC Blood Culture results may not be optimal due to an excessive volume of blood received in culture bottles   Culture   Final    NO GROWTH 5 DAYS Performed at [facility], [place] [address], [place], [place] [address]    Report Status 02/07/2020 FINAL  Final  Respiratory Panel by RT PCR (Flu A&B, Covid) - Nasopharyngeal Swab     Status: None   Collection Time: 02/02/20  6:22 PM   Specimen: Nasopharyngeal Swab  Result Value Ref Range Status   SARS Coronavirus 2 by RT PCR NEGATIVE NEGATIVE Final    Comment: (NOTE) SARS-CoV-2 target nucleic acids are NOT DETECTED. The SARS-CoV-2 RNA is generally detectable in upper respiratoy specimens during the acute phase of infection. The lowest concentration of SARS-CoV-2 viral copies this assay can detect is 131 copies/mL. A negative result does not preclude SARS-Cov-2 infection and should not be used as the sole basis for treatment or other patient management decisions. A negative result may occur with  improper specimen collection/handling, submission of specimen other than nasopharyngeal swab, presence of viral mutation(s) within the areas targeted by this assay, and inadequate number of viral copies (<131 copies/mL). A negative result must be combined with clinical observations, patient history, and epidemiological information. The expected result is Negative. Fact Sheet for Patients:  PinkCheek.be Fact Sheet for Healthcare Providers:  GravelBags.it This test is not yet ap proved or cleared by the [place] FDA and  has been authorized for detection and/or diagnosis of SARS-CoV-2 by FDA under an Emergency Use Authorization (EUA). This EUA will remain  in effect (meaning this test can be used) for the duration of the COVID-19 declaration under Section 564(b)(1) of the Act, 21 U.S.C. section 360bbb-3(b)(1), unless the authorization is terminated or revoked  sooner.    Influenza A by PCR NEGATIVE NEGATIVE Final   Influenza B by PCR NEGATIVE NEGATIVE Final    Comment: (NOTE) The Xpert Xpress  SARS-CoV-2/FLU/RSV assay is intended as an aid in  the diagnosis of influenza from Nasopharyngeal swab specimens and  should not be used as a sole basis for treatment. Nasal washings and  aspirates are unacceptable for Xpert Xpress SARS-CoV-2/FLU/RSV  testing. Fact Sheet for Patients: PinkCheek.be Fact Sheet for Healthcare Providers: GravelBags.it This test is not yet approved or cleared by the [place] FDA and  has been authorized for detection and/or diagnosis of SARS-CoV-2 by  FDA under an Emergency Use Authorization (EUA). This EUA will remain  in effect (meaning this test can be used) for the duration of the  Covid-19 declaration under Section 564(b)(1) of the Act, 21  U.S.C. section 360bbb-3(b)(1), unless the authorization is  terminated or revoked. Performed at [facility], [place] [address] [place], [place] [address]      Labs: Basic Metabolic Panel: Recent Labs  Lab 02/05/20 0201 02/06/20 0246 02/07/20 0327 02/08/20 0232 02/09/20 0155  NA 136 137 138 136 137  K 3.3* 3.6 3.2* 3.7 3.6  CL 103 105 107 105 103  CO2 23 22 23 23 24   GLUCOSE 80 130* 98 90 91  BUN 7* 14 8 6* 7*  CREATININE 0.83 0.77 0.71 0.71 0.86  CALCIUM 8.4* 8.3* 8.2* 8.6* 9.1   Liver Function Tests: Recent Labs  Lab 02/05/20 0201 02/06/20 0246 02/07/20 0327 02/08/20 0232 02/09/20 0155  AST 19 13* 29 21 20   ALT 38 27 33 28 24  ALKPHOS 162* 157* 146* 155* 143*  BILITOT 3.0* 1.7* 1.4* 1.3* 1.4*  PROT 6.2* 5.9* 6.0* 6.5 6.8  ALBUMIN 2.5* 2.5* 2.7* 2.8* 3.0*   Recent Labs  Lab 02/04/20 0208  LIPASE 18   No results for input(s): AMMONIA in the last 168 hours. CBC: Recent Labs  Lab 02/05/20 0201 02/06/20 0246 02/07/20 0327 02/08/20 0232 02/09/20 0155  WBC 12.0* 7.9 9.4 9.2 8.9   NEUTROABS 9.7* 6.5 7.0 6.8 6.0  HGB 12.5* 12.3* 11.8* 13.3 14.4  HCT 37.0* 36.2* 35.6* 39.8 42.5  MCV 71.6* 71.7* 72.2* 72.0* 70.6*  PLT 163 236 263 303 370   Cardiac Enzymes: No results for input(s): CKTOTAL, CKMB, CKMBINDEX, TROPONINI in the last 168 hours. BNP: BNP (last 3 results) No results for input(s): BNP in the last 8760 hours.  ProBNP (last 3 results) No results for input(s): PROBNP in the last 8760 hours.  CBG: Recent Labs  Lab 02/09/20 0415 02/09/20 0813 02/09/20 1659 02/09/20 2347 02/10/20 0856  GLUCAP 85 108* 102* 93 122*       Signed:  Alma Friendly, MD [facility] 02/10/2020, 11:36 AM

## 2020-02-10 NOTE — Progress Notes (Signed)
Supervising Physician: Corrie Mckusick  Patient Status:  [facility] - In-pt  Chief Complaint:  Biliary obstruction  Subjective:  I/E Biliary drain placed in IR 02/06/20 Clinical History: Bilirubinemia  Specimen Submitted: A. BILE DUCT,COMMON,BRUSHING:  FINAL MICROSCOPIC DIAGNOSIS:  - No malignant cells identified   OP was significant-- capped 3/8 No N/V No pain TB 1.4 (1.3)  Allergies: Patient has no known allergies.  Medications: Prior to Admission medications   Medication Sig Start Date End Date Taking? Authorizing Provider  amLODipine (NORVASC) 10 MG tablet Take 10 mg by mouth daily.   Yes [provider]  aspirin EC 81 MG tablet Take 81 mg by mouth daily.   Yes [provider]  fluticasone (FLONASE) 50 MCG/ACT nasal spray Place 1 spray into both nostrils daily.   Yes [provider]  galantamine (RAZADYNE ER) 8 MG 24 hr capsule Take 8 mg by mouth daily with breakfast.   Yes [provider]  levothyroxine (SYNTHROID, LEVOTHROID) 137 MCG tablet Take 137 mcg by mouth daily before breakfast.   Yes [provider]  loratadine (CLARITIN) 10 MG tablet Take 10 mg by mouth daily.   Yes [provider]  omeprazole (PRILOSEC) 20 MG capsule Take 20 mg by mouth daily.   Yes [provider]  enoxaparin (LOVENOX) 40 MG/0.4ML injection Inject 0.4 mLs (40 mg total) into the skin daily. Inject one syringe of Lovenox once a day for 10 days following surgery. Then resume one baby aspirin once a day. Patient not taking: Reported on 02/03/2020 06/28/18   Edmisten, Ok Anis, PA  methocarbamol (ROBAXIN) 500 MG tablet Take 1 tablet (500 mg total) by mouth every 6 (six) hours as needed for muscle spasms. Patient not taking: Reported on 02/03/2020 06/28/18   Edmisten, Drue Dun L, PA  mirtazapine (REMERON) 15 MG tablet Take 15 mg by mouth at bedtime.    [provider]  oxyCODONE (OXY IR/ROXICODONE) 5 MG immediate release tablet Take 1-2  tablets (5-10 mg total) by mouth every 6 (six) hours as needed for moderate pain or severe pain. Patient not taking: Reported on 02/03/2020 06/28/18   Derl Barrow, PA  traZODone (DESYREL) 50 MG tablet Take 50 mg by mouth at bedtime as needed for sleep.    [provider]     Vital Signs: BP (!) 143/99 (BP Location: Right Arm)   Pulse 62   Temp 98.8 F (37.1 C) (Oral)   Resp 18   Ht 5\' 7"  (1.702 m)   Wt 123 lb 0.3 oz (55.8 kg)   SpO2 99%   BMI 19.27 kg/m   Physical Exam Vitals reviewed.  Skin:    General: Skin is warm and dry.     Comments: Site is clean and dry NT no bleeding  Clinical History: Bilirubinemia  Specimen Submitted: A. BILE DUCT,COMMON,BRUSHING:  FINAL MICROSCOPIC DIAGNOSIS:  - No malignant cells identified   Neurological:     Mental Status: He is alert.     Imaging:   Labs:  CBC: Recent Labs    02/06/20 0246 02/07/20 0327 02/08/20 0232 02/09/20 0155  WBC 7.9 9.4 9.2 8.9  HGB 12.3* 11.8* 13.3 14.4  HCT 36.2* 35.6* 39.8 42.5  PLT 236 263 303 370    COAGS: Recent Labs    02/02/20 2143 02/03/20 0325 02/04/20 0808  INR 1.2 1.3* 1.2    BMP: Recent Labs    02/06/20 0246 02/07/20 0327 02/08/20 0232 02/09/20 0155  NA 137 138 136 137  K 3.6 3.2* 3.7 3.6  CL 105 107 105 103  CO2 22 23 23 24   GLUCOSE 130* 98 90 91  BUN 14 8 6* 7*  CALCIUM 8.3* 8.2* 8.6* 9.1  CREATININE 0.77 0.71 0.71 0.86  GFRNONAA >60 >60 >60 >60  GFRAA >60 >60 >60 >60    LIVER FUNCTION TESTS: Recent Labs    02/06/20 0246 02/07/20 0327 02/08/20 0232 02/09/20 0155  BILITOT 1.7* 1.4* 1.3* 1.4*  AST 13* 29 21 20   ALT 27 33 28 24  ALKPHOS 157* 146* 155* 143*  PROT 5.9* 6.0* 6.5 6.8  ALBUMIN 2.5* 2.7* 2.8* 3.0*    Assessment and Plan:  Biliary obstruction I/E Biliary drain placed 3/5 Brush bx : no malig cells  Capped yesterday will need to come to IR 2 - 3 post DC weeks for cholangiogram with possible upsize using conscious  sedation  Electronically Signed: Lavonia Drafts, PA-C 02/10/2020, 7:24 AM   I spent a total of 15 Minutes at the the patient's bedside AND on the patient's hospital floor or unit, greater than 50% of which was counseling/coordinating care for Biliary drain

## 2020-02-10 NOTE — Progress Notes (Signed)
[facility] Progress Note  5 Days Post-Op  Subjective: Patient denies abdominal pain or nausea. Tolerating diet and passing flatus. Was told yesterday that he would be discharged today. Reviewed cytology results with patient and surgical recommendations.   Review of Systems  Constitutional: Negative for chills and fever.  Respiratory: Negative for shortness of breath.   Cardiovascular: Negative for chest pain.  Gastrointestinal: Negative for abdominal pain, constipation, diarrhea, nausea and vomiting.  Genitourinary: Negative for dysuria, frequency and urgency.     Objective: Vital signs in last 24 hours: Temp:  [98.5 F (36.9 C)-98.8 F (37.1 C)] 98.8 F (37.1 C) (03/09 0525) Pulse Rate:  [62-64] 62 (03/09 0525) Resp:  [18] 18 (03/09 0525) BP: (132-143)/(85-99) 143/99 (03/09 0525) SpO2:  [98 %-99 %] 99 % (03/09 0525) Last BM Date: 02/05/20  Intake/Output from previous day: 03/08 0701 - 03/09 0700 In: 600 [P.O.:600] Out: 840 [Urine:750; Drains:90] Intake/Output this shift: No intake/output data recorded.  PE: General: pleasant, WD, WN male who is laying in bed in NAD HEENT: Sclera are anicteric. PERRL. Ears and nose without any masses or lesions. Mouth is pink and moist Heart: regular, rate, and rhythm. Normal s1,s2. No obvious murmurs, gallops, or rubs noted. Palpable radial and pedal pulses bilaterally Lungs: CTAB, no wheezes, rhonchi, or rales noted. Respiratory effort nonlabored Abd: soft,no ttp, ND, +BS MS: all 4 extremities are symmetrical with no cyanosis, clubbing, or edema. Skin: warm and dry with no masses, lesions, or rashes Neuro: Cranial nerves 2-12 grossly intact, sensation grossly intact throughout Psych: A&Ox3 with an appropriate affect.   Lab Results:  Recent Labs    02/08/20 0232 02/09/20 0155  WBC 9.2 8.9  HGB 13.3 14.4  HCT 39.8 42.5  PLT 303 370   BMET Recent Labs    02/08/20 0232 02/09/20 0155  NA 136 137  K 3.7  3.6  CL 105 103  CO2 23 24  GLUCOSE 90 91  BUN 6* 7*  CREATININE 0.71 0.86  CALCIUM 8.6* 9.1   PT/INR No results for input(s): LABPROT, INR in the last 72 hours. CMP     Component Value Date/Time   NA 137 02/09/2020 0155   K 3.6 02/09/2020 0155   CL 103 02/09/2020 0155   CO2 24 02/09/2020 0155   GLUCOSE 91 02/09/2020 0155   BUN 7 (L) 02/09/2020 0155   CREATININE 0.86 02/09/2020 0155   CALCIUM 9.1 02/09/2020 0155   PROT 6.8 02/09/2020 0155   ALBUMIN 3.0 (L) 02/09/2020 0155   AST 20 02/09/2020 0155   ALT 24 02/09/2020 0155   ALKPHOS 143 (H) 02/09/2020 0155   BILITOT 1.4 (H) 02/09/2020 0155   GFRNONAA >60 02/09/2020 0155   GFRAA >60 02/09/2020 0155   Lipase     Component Value Date/Time   LIPASE 18 02/04/2020 0208       Studies/Results: No results found.  Anti-infectives: Anti-infectives (From admission, onward)   Start     Dose/Rate Route Frequency Ordered Stop   02/03/20 1500  ampicillin-sulbactam (UNASYN) 1.5 g in sodium chloride 0.9 % 100 mL IVPB     1.5 g 200 mL/hr over 30 Minutes Intravenous Every 6 hours 02/03/20 1409     02/02/20 2200  piperacillin-tazobactam (ZOSYN) IVPB 3.375 g  Status:  Discontinued     3.375 g 12.5 mL/hr over 240 Minutes Intravenous Every 8 hours 02/02/20 2134 02/03/20 1415   02/02/20 1645  piperacillin-tazobactam (ZOSYN) IVPB 3.375 g     3.375 g 100 mL/hr over  13 Minutes Intravenous  Once 02/02/20 1635 02/02/20 1817       Assessment/Plan HTN Hypothyroidism PTSD Bipolar Disorder  Transaminitis with hyperbilirubinemia  Possible cholecystitis Possible cholangiocarcinoma  -Labs have now normalized and patient's abdominal pain improved, afebrile - s/p ERCP 3/4 with biliary sphincterotomy - s/p PTC 3/5 - brushings from this negative for malignancy - PTC capped 3/8 - patient is stable for discharge from a surgical perspective - would recommend discharge on PO abx for a total length of 14d abx (5 more days) - will need  referral to tertiary center for possible lap chole at an interim date, I have asked our office to work on this referral - recommend follow up with IR for PTC, recommend GI follow up for possible screening colonoscopy given elevated CEA - I called patient's wife and went over surgical recommendations  FEN: soft diet  VTE: SCDs ID: Zosyn 3/1>3/2; Unasyn 3/2>>  LOS: 8 days    Brigid Re , [facility] 02/10/2020, 9:37 AM Please see Amion for pager number during day hours 7:00am-4:30pm

## 2020-02-11 ENCOUNTER — Other Ambulatory Visit (HOSPITAL_COMMUNITY): Payer: Self-pay | Admitting: Radiology

## 2020-02-11 DIAGNOSIS — K831 Obstruction of bile duct: Secondary | ICD-10-CM

## 2020-02-12 ENCOUNTER — Telehealth (HOSPITAL_COMMUNITY): Payer: Self-pay

## 2020-02-12 NOTE — Telephone Encounter (Signed)
Called to schedule drain exchange, no answer, left vm. AW 

## 2020-03-05 ENCOUNTER — Emergency Department (HOSPITAL_COMMUNITY): Payer: Medicare Other

## 2020-03-05 ENCOUNTER — Other Ambulatory Visit: Payer: Self-pay

## 2020-03-05 ENCOUNTER — Emergency Department (HOSPITAL_COMMUNITY)
Admission: EM | Admit: 2020-03-05 | Discharge: 2020-03-05 | Disposition: A | Discharge status: Home / Self Care | Payer: Medicare Other | Attending: Emergency Medicine | Admitting: Emergency Medicine

## 2020-03-05 DIAGNOSIS — R109 Unspecified abdominal pain: Secondary | ICD-10-CM | POA: Insufficient documentation

## 2020-03-05 DIAGNOSIS — Z79899 Other long term (current) drug therapy: Secondary | ICD-10-CM | POA: Insufficient documentation

## 2020-03-05 DIAGNOSIS — F17210 Nicotine dependence, cigarettes, uncomplicated: Secondary | ICD-10-CM | POA: Insufficient documentation

## 2020-03-05 DIAGNOSIS — E039 Hypothyroidism, unspecified: Secondary | ICD-10-CM | POA: Insufficient documentation

## 2020-03-05 DIAGNOSIS — Z7982 Long term (current) use of aspirin: Secondary | ICD-10-CM | POA: Diagnosis not present

## 2020-03-05 DIAGNOSIS — I10 Essential (primary) hypertension: Secondary | ICD-10-CM | POA: Insufficient documentation

## 2020-03-05 LAB — CBC WITH DIFFERENTIAL/PLATELET
Abs Immature Granulocytes: 0.03 10*3/uL (ref 0.00–0.07)
Basophils Absolute: 0 10*3/uL (ref 0.0–0.1)
Basophils Relative: 1 %
Eosinophils Absolute: 0.3 10*3/uL (ref 0.0–0.5)
Eosinophils Relative: 3 %
HCT: 41.7 % (ref 39.0–52.0)
Hemoglobin: 13.7 g/dL (ref 13.0–17.0)
Immature Granulocytes: 0 %
Lymphocytes Relative: 17 %
Lymphs Abs: 1.4 10*3/uL (ref 0.7–4.0)
MCH: 23.6 pg — ABNORMAL LOW (ref 26.0–34.0)
MCHC: 32.9 g/dL (ref 30.0–36.0)
MCV: 71.8 fL — ABNORMAL LOW (ref 80.0–100.0)
Monocytes Absolute: 0.6 10*3/uL (ref 0.1–1.0)
Monocytes Relative: 8 %
Neutro Abs: 5.8 10*3/uL (ref 1.7–7.7)
Neutrophils Relative %: 71 %
Platelets: 138 10*3/uL — ABNORMAL LOW (ref 150–400)
RBC: 5.81 MIL/uL (ref 4.22–5.81)
RDW: 14.1 % (ref 11.5–15.5)
WBC: 8.1 10*3/uL (ref 4.0–10.5)
nRBC: 0 % (ref 0.0–0.2)

## 2020-03-05 LAB — LIPASE, BLOOD: Lipase: 53 U/L — ABNORMAL HIGH (ref 11–51)

## 2020-03-05 LAB — URINALYSIS, ROUTINE W REFLEX MICROSCOPIC
Bilirubin Urine: NEGATIVE
Glucose, UA: NEGATIVE mg/dL
Hgb urine dipstick: NEGATIVE
Ketones, ur: NEGATIVE mg/dL
Leukocytes,Ua: NEGATIVE
Nitrite: NEGATIVE
Protein, ur: NEGATIVE mg/dL
Specific Gravity, Urine: 1.02 (ref 1.005–1.030)
pH: 7 (ref 5.0–8.0)

## 2020-03-05 LAB — COMPREHENSIVE METABOLIC PANEL
ALT: 216 U/L — ABNORMAL HIGH (ref 0–44)
AST: 216 U/L — ABNORMAL HIGH (ref 15–41)
Albumin: 3.5 g/dL (ref 3.5–5.0)
Alkaline Phosphatase: 176 U/L — ABNORMAL HIGH (ref 38–126)
Anion gap: 15 (ref 5–15)
BUN: 15 mg/dL (ref 8–23)
CO2: 24 mmol/L (ref 22–32)
Calcium: 9.7 mg/dL (ref 8.9–10.3)
Chloride: 98 mmol/L (ref 98–111)
Creatinine, Ser: 1.05 mg/dL (ref 0.61–1.24)
GFR calc Af Amer: >60 mL/min (ref >60)
GFR calc non Af Amer: >60 mL/min (ref >60)
Glucose, Bld: 130 mg/dL — ABNORMAL HIGH (ref 70–99)
Potassium: 3.8 mmol/L (ref 3.5–5.1)
Sodium: 137 mmol/L (ref 135–145)
Total Bilirubin: 0.6 mg/dL (ref 0.3–1.2)
Total Protein: 7.2 g/dL (ref 6.5–8.1)

## 2020-03-05 LAB — ETHANOL: Alcohol, Ethyl (B): <10 mg/dL (ref <10)

## 2020-03-05 MED ORDER — AMOXICILLIN-POT CLAVULANATE 875-125 MG PO TABS: 1.0000 | ORAL_TABLET | Freq: Two times a day (BID) | ORAL | 0 refills | Status: DC

## 2020-03-05 MED ORDER — IOHEXOL 300 MG/ML  SOLN
100.0000 mL | Freq: Once | INTRAMUSCULAR | Status: AC | PRN
  Administered 2020-03-05: 100 mL via INTRAVENOUS

## 2020-03-05 NOTE — ED Notes (Signed)
Pt transported to CT ?

## 2020-03-05 NOTE — ED Triage Notes (Addendum)
Pt presents from home for pain and "pulling sensation" at drain site to abd x1 wk, placed in March. Endorses fever at home, 99.93F in triage; endorses constipation.Pt states he was confused about follow up plans and never went to appts for f/u, finished augmentin 1 wk ago. Has appt 4/19 at [facility]. Denies N/V, drainage.

## 2020-03-05 NOTE — Discharge Instructions (Addendum)
Please return for any problem.  Follow-up with your regular care providers.  Follow-up with [facility] surgical services as instructed.  Take Augmentin as prescribed.

## 2020-03-05 NOTE — ED Provider Notes (Signed)
[place] EMERGENCY DEPARTMENT Provider Note   CSN: [id number] Arrival date & time: 03/05/20  [id number]     History Chief Complaint  Patient presents with  . Post-op Problem    Rodney Romero is a 74 y.o. male.  74 year old male with prior medical history as detailed below presents for evaluation of abdominal pain.  Patient reports 1 week of abdominal pulling around site of percutaneous abdominal drain.  He reports subjective fever at home.  He denies nausea or vomiting.  He does report some mild constipation.  He reports that he completed Augmentin approximately 1 week ago.  He has not yet seen the surgical services at [facility] health as was arranged at the time of his last admission.  He reports that the drain in place is the original drain placed by IR last month.  His spouse provides additional details.  She reports that the follow-up appointment with [facility] health surgical services was delayed.  He has an appointment on April 19 with the [place] surgical services.  The history is provided by the patient, the spouse and medical records.  Illness Location:  Abdominal pain Severity:  Mild Onset quality:  Gradual Duration:  1 week Timing:  Constant Progression:  Waxing and waning Chronicity:  New      Past Medical History:  Diagnosis Date  . Bipolar 1 disorder ([place])   . Hypertension   . Hypothyroidism   . PTSD (post-traumatic stress disorder)     Patient Active Problem List   Diagnosis Date Noted  . Cholecystitis, acute   . Abdominal pain 02/02/2020  . Elevated LFTs 02/02/2020  . Hypertension 02/02/2020  . Closed tibia fracture 06/25/2018    Past Surgical History:  Procedure Laterality Date  . ERCP N/A 02/05/2020   Procedure: ENDOSCOPIC RETROGRADE CHOLANGIOPANCREATOGRAPHY (ERCP);  Surgeon: Clarene Essex, MD;  Location: [place];  Service: Endoscopy;  Laterality: N/A;  . GALLBLADDER SURGERY    . IR ENDOLUMINAL  BX OF BILIARY TREE  02/06/2020  . IR INT EXT BILIARY DRAIN WITH CHOLANGIOGRAM  02/06/2020  . SPHINCTEROTOMY  02/05/2020   Procedure: SPHINCTEROTOMY;  Surgeon: Clarene Essex, MD;  Location: [facility] ENDOSCOPY;  Service: Endoscopy;;  . TIBIA IM NAIL INSERTION Left 06/26/2018   Procedure: left tibia intramedullary nail;  Surgeon: Gaynelle Arabian, MD;  Location: [facility];  Service: Orthopedics;  Laterality: Left;       Family History  Problem Relation Age of Onset  . Hypertension Mother   . Diabetes Mother   . Seizures Father     Social History   Tobacco Use  . Smoking status: Current Every Day Smoker    Packs/day: 0.50    Types: Cigarettes    Last attempt to quit: 09/03/2014    Years since quitting: 5.5  . Smokeless tobacco: Never Used  Substance Use Topics  . Alcohol use: Yes    Comment: Everyother day   . Drug use: No    Home Medications Prior to Admission medications   Medication Sig Start Date End Date Taking? Authorizing Provider  amLODipine (NORVASC) 10 MG tablet Take 10 mg by mouth daily.    [provider]  aspirin EC 81 MG tablet Take 81 mg by mouth daily.    [provider]  fluticasone (FLONASE) 50 MCG/ACT nasal spray Place 1 spray into both nostrils daily.    [provider]  galantamine (RAZADYNE ER) 8 MG 24 hr capsule Take 8 mg by mouth daily  with breakfast.    [provider]  levothyroxine (SYNTHROID, LEVOTHROID) 137 MCG tablet Take 137 mcg by mouth daily before breakfast.    [provider]  loratadine (CLARITIN) 10 MG tablet Take 10 mg by mouth daily.    [provider]  mirtazapine (REMERON) 15 MG tablet Take 15 mg by mouth at bedtime.    [provider]  omeprazole (PRILOSEC) 20 MG capsule Take 20 mg by mouth daily.    [provider]  traZODone (DESYREL) 50 MG tablet Take 50 mg by mouth at bedtime as needed for sleep.    [provider]    Allergies    Patient has no known  allergies.  Review of Systems   Review of Systems  All other systems reviewed and are negative.   Physical Exam Updated Vital Signs BP 129/84   Pulse 70   Temp 99 F (37.2 C) (Oral)   Resp 20   SpO2 98%   Physical Exam Vitals and nursing note reviewed.  Constitutional:      General: He is not in acute distress.    Appearance: Normal appearance. He is well-developed.  HENT:     Head: Normocephalic and atraumatic.  Eyes:     Conjunctiva/sclera: Conjunctivae normal.     Pupils: Pupils are equal, round, and reactive to light.  Cardiovascular:     Rate and Rhythm: Normal rate and regular rhythm.     Heart sounds: Normal heart sounds.  Pulmonary:     Effort: Pulmonary effort is normal. No respiratory distress.     Breath sounds: Normal breath sounds.  Abdominal:     General: There is no distension.     Palpations: Abdomen is soft.     Tenderness: There is no abdominal tenderness.     Comments: Percutaneous drain in place in the midline.  No surrounding erythema.  No drainage noted.  Musculoskeletal:        General: No deformity. Normal range of motion.     Cervical back: Normal range of motion and neck supple.  Skin:    General: Skin is warm and dry.  Neurological:     General: No focal deficit present.     Mental Status: He is alert and oriented to person, place, and time. Mental status is at baseline.     ED Results / Procedures / Treatments   Labs (all labs ordered are listed, but only abnormal results are displayed) Labs Reviewed  COMPREHENSIVE METABOLIC PANEL - Abnormal; Notable for the following components:      Result Value   Glucose, Bld 130 (*)    AST 216 (*)    ALT 216 (*)    Alkaline Phosphatase 176 (*)    All other components within normal limits  LIPASE, BLOOD - Abnormal; Notable for the following components:   Lipase 53 (*)    All other components within normal limits  CBC WITH DIFFERENTIAL/PLATELET - Abnormal; Notable for the following  components:   MCV 71.8 (*)    MCH 23.6 (*)    Platelets 138 (*)    All other components within normal limits  URINALYSIS, ROUTINE W REFLEX MICROSCOPIC - Abnormal; Notable for the following components:   APPearance HAZY (*)    All other components within normal limits  ETHANOL    EKG None  Radiology CT ABDOMEN PELVIS W CONTRAST  Result Date: 03/05/2020 CLINICAL DATA:  Fever and abdominal pain. Internal external biliary drain placement for CBD obstruction placed a month  ago. EXAM: CT ABDOMEN AND PELVIS WITH CONTRAST TECHNIQUE: Multidetector CT imaging of the abdomen and pelvis was performed using the standard protocol following bolus administration of intravenous contrast. CONTRAST:  128mL OMNIPAQUE IOHEXOL 300 MG/ML  SOLN COMPARISON:  MRCP dated February 03, 2020. CT abdomen pelvis dated February 02, 2020. FINDINGS: Lower chest: No acute abnormality. Minimal right greater than left lower lobe subsegmental atelectasis. Hepatobiliary: No focal liver abnormality. Interval placement of a internal external biliary drain in good position, with resolved intra-and extrahepatic biliary dilatation. Persistent significant wall thickening of the gallbladder measuring up to 8 mm. No radiopaque gallstones. Pancreas: Unremarkable. No pancreatic ductal dilatation or surrounding inflammatory changes. Spleen: Normal in size without focal abnormality. Adrenals/Urinary Tract: The adrenal glands are unremarkable. Unchanged focal scarring in the upper poles of both kidneys. Unchanged 1.3 cm simple cyst in the left kidney. Unchanged 3.8 x 2.6 cm complex cystic lesion in the right kidney with multiple thin internal septations and coarse, thick mural calcification in the posterior medial aspect of the lesion. While it has increased in size since 2015, the lesion demonstrates no morphologic changes since then. Other tiny subcentimeter low-density lesions in the right kidney remain too small to characterize. No renal calculi or  hydronephrosis. The bladder is unremarkable. Stomach/Bowel: Stomach is within normal limits. Appendix appears normal. No evidence of bowel wall thickening, distention, or inflammatory changes. Vascular/Lymphatic: Aortic atherosclerosis. No enlarged abdominal or pelvic lymph nodes. Reproductive: Unchanged prostatomegaly. Other: Unchanged tiny fat containing left inguinal hernia. No free fluid or pneumoperitoneum. Musculoskeletal: No acute or significant osseous findings. IMPRESSION: 1. Interval placement of an internal external biliary drain in good position, with resolved intra-and extrahepatic biliary dilatation. 2. 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 3. 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 While this lesion has increased in size since 2015, it demonstrates no morphologic changes since then. No further follow-up required. 4. Aortic Atherosclerosis (ICD10-I70.0). Electronically Signed   By: Titus Dubin M.D.   On: 03/05/2020 10:41    Procedures Procedures (including critical care time)  Medications Ordered in ED Medications - No data to display  ED Course  I have reviewed the triage vital signs and the nursing notes.  Pertinent labs & imaging results that were available during my care of the patient were reviewed by me and considered in my medical decision making (see chart for details).    MDM Rules/Calculators/A&P                      MDM  Screen complete  Rodney Romero was evaluated in Emergency Department on 03/05/2020 for the symptoms described in the history of present illness. He was evaluated in the context of the global COVID-19 pandemic, which necessitated consideration that the patient might be at risk for infection with the SARS-CoV-2 virus that causes COVID-19. Institutional protocols and algorithms that pertain to the evaluation of patients at risk for COVID-19 are in a  state of rapid change based on information released by regulatory bodies including the CDC and federal and state organizations. These policies and algorithms were followed during the patient's care in the ED.  Patient is presenting for evaluation abdominal pain.  Patient status post recent hospitalization for treatment of suspected biliary infection and possible obstruction.  Work-up today does not reveal significant acute abnormality.  Patient's LFTs are mildly elevated.  Case discussed with surgery who will consult in the ED.  Patient appears to be appropriate for discharge per surgery.  Patient will be discharged with 2-week supply of Augmentin.  Patient's drain is attached to a gravity bag for improved drainage.  Patient and his wife do understand the need for close follow-up with the surgical services at [place].  Strict return precautions given and understood.   Final Clinical Impression(s) / ED Diagnoses Final diagnoses:  Abdominal pain, unspecified abdominal location    Rx / DC Orders ED Discharge Orders         Ordered    amoxicillin-clavulanate (AUGMENTIN) 875-125 MG tablet  Every 12 hours     03/05/20 1423           Valarie Merino, MD 03/05/20 1426

## 2020-03-05 NOTE — Consult Note (Signed)
[facility] Surgery Consult Note  Rodney Romero July 22, 1946  [id number].    Requesting MD: Dene Gentry Chief Complaint/Reason for Consult: abdominal pain  HPI:  Rodney Romero is a 74yo male PMH HTN, Hypothyroidism, PTSD, Bipolar Disorder, and recent admission 3/1 through 3/9 with transaminitis/hyperbilirubinemia and concern for possible cholangiocarcinoma. s/p ERCP 3/4 with biliary sphincterotomy. s/p PTC 3/5 with brushings negative for malignancy. PTC capped prior to discharge. Despite negative brushings he has concerning findings on MRCP which may indicate a variety of conditions which could potentially require advanced hepatobiliary intervention at time of attempted cholecystectomy. Patient was referred to [facility] and he has his first appointment scheduled for 03/22/2020.  He comes to the ED complaining of 1 week of abdominal pain. States that it is constant and located around his drain. Not worse with PO intake. He has been tolerating a diet. Denies n/v. No fever or chills. He does report some constipation. He completed his course of augmentin. He is scheduled for a cholangiogram 4/7 with IR. In the ED WBC 8.1, lipase 53, AST 216, ALT 216, Alk phos 176, Tbili 0.6. 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 General surgery asked to see.  Review of Systems  Constitutional: Negative.   Respiratory: Negative.   Cardiovascular: Negative.   Gastrointestinal: Positive for abdominal pain and constipation. Negative for nausea and vomiting.   All systems reviewed and otherwise negative except for as above  Family History  Problem Relation Age of Onset  . Hypertension Mother   . Diabetes Mother   . Seizures Father     Past Medical History:  Diagnosis Date  . Bipolar  1 disorder ([place])   . Hypertension   . Hypothyroidism   . PTSD (post-traumatic stress disorder)     Past Surgical History:  Procedure Laterality Date  . ERCP N/A 02/05/2020   Procedure: ENDOSCOPIC RETROGRADE CHOLANGIOPANCREATOGRAPHY (ERCP);  Surgeon: Clarene Essex, MD;  Location: [place];  Service: Endoscopy;  Laterality: N/A;  . GALLBLADDER SURGERY    . IR ENDOLUMINAL BX OF BILIARY TREE  02/06/2020  . IR INT EXT BILIARY DRAIN WITH CHOLANGIOGRAM  02/06/2020  . SPHINCTEROTOMY  02/05/2020   Procedure: SPHINCTEROTOMY;  Surgeon: Clarene Essex, MD;  Location: [facility] ENDOSCOPY;  Service: Endoscopy;;  . TIBIA IM NAIL INSERTION Left 06/26/2018   Procedure: left tibia intramedullary nail;  Surgeon: Gaynelle Arabian, MD;  Location: [facility];  Service: Orthopedics;  Laterality: Left;    Social History:  reports that he has been smoking cigarettes. He has been smoking about 0.50 packs per day. He has never used smokeless tobacco. He reports current alcohol use. He reports that he does not use drugs.  Allergies: No Known Allergies  (Not in a hospital admission)   Prior to Admission medications   Medication Sig Start Date End Date Taking? Authorizing Provider  amLODipine (NORVASC) 10 MG tablet Take 10 mg by mouth daily.   Yes [provider]  aspirin EC 81 MG tablet Take 81 mg by mouth daily.   Yes [provider]  fluticasone (FLONASE) 50 MCG/ACT nasal spray Place 1 spray into both nostrils daily.   Yes [provider]  levothyroxine (SYNTHROID, LEVOTHROID) 137 MCG tablet Take 137 mcg by mouth daily before breakfast.   Yes [provider]  loratadine (CLARITIN) 10 MG tablet Take 10 mg by mouth  daily.   Yes [provider]  mirtazapine (REMERON) 15 MG tablet Take 15 mg by mouth at bedtime as needed (sleep).    Yes [provider]  Multiple Vitamins-Minerals (MULTIVITAMIN WITH MINERALS) tablet Take 1 tablet by mouth daily.   Yes [provider]   omeprazole (PRILOSEC) 20 MG capsule Take 20 mg by mouth daily.   Yes [provider]  traZODone (DESYREL) 50 MG tablet Take 50 mg by mouth at bedtime as needed for sleep.   Yes [provider]  galantamine (RAZADYNE ER) 8 MG 24 hr capsule Take 8 mg by mouth daily with breakfast.    [provider]    Blood pressure (!) 158/95, pulse 67, temperature 99 F (37.2 C), temperature source Oral, resp. rate 19, SpO2 98 %. Physical Exam: General: pleasant, frail black male who is laying in bed in NAD HEENT: head is normocephalic, atraumatic.  Sclera are noninjected.  PERRL.  Ears and nose without any masses or lesions.  Mouth is pink and moist. Dentition poor Heart: regular, rate, and rhythm.  Normal s1,s2. No obvious murmurs, gallops, or rubs noted.  Palpable pedal pulses bilaterally  Lungs: CTAB, no wheezes, rhonchi, or rales noted.  Respiratory effort nonlabored Abd: soft, ND, +BS, no masses, hernias, or organomegaly, mild tenderness only over PTC/ no erythema or drainage noted/ PTC capped off MS: no BUE/BLE edema, calves soft and nontender Skin: warm and dry with no masses, lesions, or rashes Neuro: cranial nerves grossly intact, equal strength in BUE/BLE bilaterally, normal speech, thought process intact  Results for orders placed or performed during the hospital encounter of 03/05/20 (from the past 48 hour(s))  Comprehensive metabolic panel     Status: Abnormal   Collection Time: 03/05/20  8:36 AM  Result Value Ref Range   Sodium 137 135 - 145 mmol/L   Potassium 3.8 3.5 - 5.1 mmol/L   Chloride 98 98 - 111 mmol/L   CO2 24 22 - 32 mmol/L   Glucose, Bld 130 (H) 70 - 99 mg/dL    Comment: Glucose reference range applies only to samples taken after fasting for at least 8 hours.   BUN 15 8 - 23 mg/dL   Creatinine, Ser 1.05 0.61 - 1.24 mg/dL   Calcium 9.7 8.9 - 10.3 mg/dL   Total Protein 7.2 6.5 - 8.1 g/dL   Albumin 3.5 3.5 - 5.0 g/dL   AST 216 (H) 15 - 41 U/L   ALT  216 (H) 0 - 44 U/L   Alkaline Phosphatase 176 (H) 38 - 126 U/L   Total Bilirubin 0.6 0.3 - 1.2 mg/dL   GFR calc non Af Amer >60 >60 mL/min   GFR calc Af Amer >60 >60 mL/min   Anion gap 15 5 - 15    Comment: Performed at [facility] [address], [place], [place] [address]  Lipase, blood     Status: Abnormal   Collection Time: 03/05/20  8:36 AM  Result Value Ref Range   Lipase 53 (H) 11 - 51 U/L    Comment: Performed at [facility] [address], [place], [place] [address]  CBC with Differential     Status: Abnormal   Collection Time: 03/05/20  8:36 AM  Result Value Ref Range   WBC 8.1 4.0 - 10.5 K/uL   RBC 5.81 4.22 - 5.81 MIL/uL   Hemoglobin 13.7 13.0 - 17.0 g/dL   HCT 41.7 39.0 - 52.0 %   MCV 71.8 (  L) 80.0 - 100.0 fL   MCH 23.6 (L) 26.0 - 34.0 pg   MCHC 32.9 30.0 - 36.0 g/dL   RDW 14.1 11.5 - 15.5 %   Platelets 138 (L) 150 - 400 K/uL    Comment: REPEATED TO VERIFY CONSISTENT WITH PREVIOUS RESULT    nRBC 0.0 0.0 - 0.2 %   Neutrophils Relative % 71 %   Neutro Abs 5.8 1.7 - 7.7 K/uL   Lymphocytes Relative 17 %   Lymphs Abs 1.4 0.7 - 4.0 K/uL   Monocytes Relative 8 %   Monocytes Absolute 0.6 0.1 - 1.0 K/uL   Eosinophils Relative 3 %   Eosinophils Absolute 0.3 0.0 - 0.5 K/uL   Basophils Relative 1 %   Basophils Absolute 0.0 0.0 - 0.1 K/uL   Immature Granulocytes 0 %   Abs Immature Granulocytes 0.03 0.00 - 0.07 K/uL    Comment: Performed at [facility], [address] [place], [place] [address]  Ethanol     Status: None   Collection Time: 03/05/20  8:37 AM  Result Value Ref Range   Alcohol, Ethyl (B) <10 <10 mg/dL    Comment: (NOTE) Lowest detectable limit for serum alcohol is 10 mg/dL. For medical purposes only. Performed at [facility], [place] [address], [place], [place] [address]   Urinalysis, Routine w reflex microscopic     Status: Abnormal   Collection Time: 03/05/20 10:30 AM  Result Value Ref Range   Color, Urine YELLOW YELLOW    APPearance HAZY (A) CLEAR   Specific Gravity, Urine 1.020 1.005 - 1.030   pH 7.0 5.0 - 8.0   Glucose, UA NEGATIVE NEGATIVE mg/dL   Hgb urine dipstick NEGATIVE NEGATIVE   Bilirubin Urine NEGATIVE NEGATIVE   Ketones, ur NEGATIVE NEGATIVE mg/dL   Protein, ur NEGATIVE NEGATIVE mg/dL   Nitrite NEGATIVE NEGATIVE   Leukocytes,Ua NEGATIVE NEGATIVE    Comment: Performed at [facility] [address], [place], [place] [address]   CT ABDOMEN PELVIS W CONTRAST  Result Date: 03/05/2020 CLINICAL DATA:  Fever and abdominal pain. Internal external biliary drain placement for CBD obstruction placed a month ago. EXAM: CT ABDOMEN AND PELVIS WITH CONTRAST TECHNIQUE: Multidetector CT imaging of the abdomen and pelvis was performed using the standard protocol following bolus administration of intravenous contrast. CONTRAST:  163m OMNIPAQUE IOHEXOL 300 MG/ML  SOLN COMPARISON:  MRCP dated February 03, 2020. CT abdomen pelvis dated February 02, 2020. FINDINGS: Lower chest: No acute abnormality. Minimal right greater than left lower lobe subsegmental atelectasis. Hepatobiliary: No focal liver abnormality. Interval placement of a internal external biliary drain in good position, with resolved intra-and extrahepatic biliary dilatation. Persistent significant wall thickening of the gallbladder measuring up to 8 mm. No radiopaque gallstones. Pancreas: Unremarkable. No pancreatic ductal dilatation or surrounding inflammatory changes. Spleen: Normal in size without focal abnormality. Adrenals/Urinary Tract: The adrenal glands are unremarkable. Unchanged focal scarring in the upper poles of both kidneys. Unchanged 1.3 cm simple cyst in the left kidney. Unchanged 3.8 x 2.6 cm complex cystic lesion in the right kidney with multiple thin internal septations and coarse, thick mural calcification in the posterior medial aspect of the lesion. While it has increased in size since 2015, the lesion demonstrates no morphologic changes since  then. Other tiny subcentimeter low-density lesions in the right kidney remain too small to characterize. No renal calculi or hydronephrosis. The bladder is unremarkable. Stomach/Bowel: Stomach is within normal limits. Appendix appears normal. No evidence  of bowel wall thickening, distention, or inflammatory changes. Vascular/Lymphatic: Aortic atherosclerosis. No enlarged abdominal or pelvic lymph nodes. Reproductive: Unchanged prostatomegaly. Other: Unchanged tiny fat containing left inguinal hernia. No free fluid or pneumoperitoneum. Musculoskeletal: No acute or significant osseous findings. IMPRESSION: 1. Interval placement of an internal external biliary drain in good position, with resolved intra-and extrahepatic biliary dilatation. 2. 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 3. 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 While this lesion has increased in size since 2015, it demonstrates no morphologic changes since then. No further follow-up required. 4. Aortic Atherosclerosis (ICD10-I70.0). Electronically Signed   By: Titus Dubin M.D.   On: 03/05/2020 10:41      Assessment/Plan HTN Hypothyroidism PTSD Bipolar Disorder  Abdominal pain Transaminitis S/p PTC - previous Transaminitis with hyperbilirubinemia, Possible cholecystitis or Possible cholangiocarcinomas/p ERCP 3/4 with biliary sphincterotomy, and s/p PTC 3/5 - brushings from this negative for malignancy although MRCP findings concerning - PTC capped 3/8 - referred to tertiary care facility, appointment scheduled for 03/22/2020 - CT scan today shows interval placement of an internal external biliary drain in good position with resolved intra-and extrahepatic biliary dilatation; persistent significant gallbladder wall thickening measuring up to 8 mm. Transaminases elevated at AST 216 and ALT 216, Tbili 0.6. Abdomen is benign and he has been  tolerating a diet. Spoke with IR and they will get him a bag to return the PTC back to drainage. Keep appointment next week for cholangiogram. I think he can be discharged home and keep follow up appointment at [facility].  Wellington Hampshire, [facility] 03/05/2020, 12:22 PM Please see Amion for pager number during day hours 7:00am-4:30pm

## 2020-03-05 NOTE — ED Notes (Signed)
Pt aware of need for urine sample, urinal at bedside, instructed to call if able to provide sample, oriented to use of call button.

## 2020-03-05 NOTE — ED Notes (Signed)
Patient verbalizes understanding of discharge instructions. Opportunity for questioning and answers were provided. Armband removed by staff, pt discharged from ED to home with wife

## 2020-03-08 ENCOUNTER — Ambulatory Visit (HOSPITAL_COMMUNITY): Payer: Medicare Other

## 2020-03-10 ENCOUNTER — Encounter (HOSPITAL_COMMUNITY): Payer: Self-pay

## 2020-03-10 ENCOUNTER — Other Ambulatory Visit: Payer: Self-pay

## 2020-03-10 ENCOUNTER — Ambulatory Visit (HOSPITAL_COMMUNITY)
Admission: RE | Admit: 2020-03-10 | Discharge: 2020-03-10 | Disposition: A | Discharge status: Home / Self Care | Payer: Medicare Other | Source: Ambulatory Visit | Attending: Radiology | Admitting: Radiology

## 2020-03-10 ENCOUNTER — Other Ambulatory Visit (HOSPITAL_COMMUNITY): Payer: Self-pay | Admitting: Radiology

## 2020-03-10 ENCOUNTER — Other Ambulatory Visit: Payer: Self-pay | Admitting: Gastroenterology

## 2020-03-10 DIAGNOSIS — L0291 Cutaneous abscess, unspecified: Secondary | ICD-10-CM

## 2020-03-10 DIAGNOSIS — E039 Hypothyroidism, unspecified: Secondary | ICD-10-CM | POA: Insufficient documentation

## 2020-03-10 DIAGNOSIS — Z79890 Hormone replacement therapy (postmenopausal): Secondary | ICD-10-CM | POA: Insufficient documentation

## 2020-03-10 DIAGNOSIS — K831 Obstruction of bile duct: Secondary | ICD-10-CM

## 2020-03-10 DIAGNOSIS — Z4803 Encounter for change or removal of drains: Secondary | ICD-10-CM | POA: Insufficient documentation

## 2020-03-10 DIAGNOSIS — Z8249 Family history of ischemic heart disease and other diseases of the circulatory system: Secondary | ICD-10-CM | POA: Insufficient documentation

## 2020-03-10 DIAGNOSIS — F4310 Post-traumatic stress disorder, unspecified: Secondary | ICD-10-CM | POA: Insufficient documentation

## 2020-03-10 DIAGNOSIS — I10 Essential (primary) hypertension: Secondary | ICD-10-CM | POA: Insufficient documentation

## 2020-03-10 DIAGNOSIS — F17210 Nicotine dependence, cigarettes, uncomplicated: Secondary | ICD-10-CM | POA: Insufficient documentation

## 2020-03-10 DIAGNOSIS — Z79899 Other long term (current) drug therapy: Secondary | ICD-10-CM | POA: Insufficient documentation

## 2020-03-10 DIAGNOSIS — F319 Bipolar disorder, unspecified: Secondary | ICD-10-CM | POA: Insufficient documentation

## 2020-03-10 DIAGNOSIS — Z7982 Long term (current) use of aspirin: Secondary | ICD-10-CM | POA: Diagnosis not present

## 2020-03-10 HISTORY — PX: IR EXCHANGE BILIARY DRAIN: IMG6046

## 2020-03-10 MED ORDER — MIDAZOLAM HCL 2 MG/2ML IJ SOLN
INTRAMUSCULAR | Status: AC
  Filled 2020-03-10: qty 2

## 2020-03-10 MED ORDER — FENTANYL CITRATE (PF) 100 MCG/2ML IJ SOLN
INTRAMUSCULAR | Status: AC | PRN
  Administered 2020-03-10 (×2): 50 ug via INTRAVENOUS

## 2020-03-10 MED ORDER — FENTANYL CITRATE (PF) 100 MCG/2ML IJ SOLN
INTRAMUSCULAR | Status: AC
  Filled 2020-03-10: qty 2

## 2020-03-10 MED ORDER — LIDOCAINE HCL 1 % IJ SOLN
INTRAMUSCULAR | Status: AC | PRN
  Administered 2020-03-10: 5 mL

## 2020-03-10 MED ORDER — LIDOCAINE HCL 1 % IJ SOLN
INTRAMUSCULAR | Status: AC
  Filled 2020-03-10: qty 20

## 2020-03-10 MED ORDER — MIDAZOLAM HCL 2 MG/2ML IJ SOLN
INTRAMUSCULAR | Status: AC | PRN
  Administered 2020-03-10: 0.5 mg via INTRAVENOUS
  Administered 2020-03-10: 1 mg via INTRAVENOUS

## 2020-03-10 MED ORDER — PIPERACILLIN-TAZOBACTAM 3.375 G IVPB 30 MIN
3.3750 g | Freq: Once | INTRAVENOUS | Status: AC
  Administered 2020-03-10: 3.375 g via INTRAVENOUS
  Filled 2020-03-10: qty 50

## 2020-03-10 MED ORDER — HYDROCODONE-ACETAMINOPHEN 5-325 MG PO TABS: 1.0000 | ORAL_TABLET | Freq: Four times a day (QID) | ORAL | 0 refills | Status: DC | PRN

## 2020-03-10 MED ORDER — IOHEXOL 300 MG/ML  SOLN
50.0000 mL | Freq: Once | INTRAMUSCULAR | Status: AC | PRN
  Administered 2020-03-10: 10 mL

## 2020-03-10 MED ORDER — SODIUM CHLORIDE 0.9% FLUSH: 5.0000 mL | Freq: Three times a day (TID) | INTRAVENOUS | Status: DC

## 2020-03-10 NOTE — Progress Notes (Signed)
Patient ID: Rodney Romero, male   DOB: [birth year], 74 y.o.   MRN: [id number]   Pt and wife asking Cherry Log RN for pain meds New pain since upsize   Discussed with Dr Earleen Newport He okays Vicodin 1-2 po every 6-8 hrs #20  Rx E prescribed to pts pharmacy [facility].

## 2020-03-10 NOTE — H&P (Signed)
Chief Complaint: Patient was seen in consultation today for cholangiogram with biliary drain exchange/upsize at the request of Dr Cleaster Corin  Supervising Physician: Corrie Mckusick  Patient Status: [facility] - Out-pt  History of Present Illness: Rodney Romero is a 74 y.o. male   Recent admission 3/1 through 3/9 with transaminitis/hyperbilirubinemia and concern for possible cholangiocarcinoma. He did undergo ERCP 3/4 with Dr. Watt Climes however the ampulla could not be cannulated. MRCP showing proximal CBD lesion. s/p PTC 3/5 with brushings negative for malignancy.  Biliary drain placed 3/5 in IR Capped at DC 02/10/20  Returned to ED 4/2 with abd pain -- no N/V; no fever/chills Biliary drain was placed back to gravity drain Pt did well after replaced to drain Now with no abd pain; minimal Nausea  Returns now for planned cholangiogram with possible upsize of biliary drain   Past Medical History:  Diagnosis Date  . Bipolar 1 disorder ([place])   . Hypertension   . Hypothyroidism   . PTSD (post-traumatic stress disorder)     Past Surgical History:  Procedure Laterality Date  . ERCP N/A 02/05/2020   Procedure: ENDOSCOPIC RETROGRADE CHOLANGIOPANCREATOGRAPHY (ERCP);  Surgeon: Clarene Essex, MD;  Location: [place];  Service: Endoscopy;  Laterality: N/A;  . GALLBLADDER SURGERY    . IR ENDOLUMINAL BX OF BILIARY TREE  02/06/2020  . IR INT EXT BILIARY DRAIN WITH CHOLANGIOGRAM  02/06/2020  . SPHINCTEROTOMY  02/05/2020   Procedure: SPHINCTEROTOMY;  Surgeon: Clarene Essex, MD;  Location: [facility] ENDOSCOPY;  Service: Endoscopy;;  . TIBIA IM NAIL INSERTION Left 06/26/2018   Procedure: left tibia intramedullary nail;  Surgeon: Gaynelle Arabian, MD;  Location: [facility];  Service: Orthopedics;  Laterality: Left;    Allergies: Patient has no known allergies.  Medications: Prior to Admission medications   Medication Sig Start Date End Date Taking? Authorizing Provider  amLODipine (NORVASC) 10 MG tablet Take 10 mg by  mouth daily.   Yes [provider]  amoxicillin-clavulanate (AUGMENTIN) 875-125 MG tablet Take 1 tablet by mouth every 12 (twelve) hours. 03/05/20  Yes Valarie Merino, MD  aspirin EC 81 MG tablet Take 81 mg by mouth daily.   Yes [provider]  fluticasone (FLONASE) 50 MCG/ACT nasal spray Place 1 spray into both nostrils daily.   Yes [provider]  galantamine (RAZADYNE ER) 8 MG 24 hr capsule Take 8 mg by mouth daily with breakfast.   Yes [provider]  levothyroxine (SYNTHROID, LEVOTHROID) 137 MCG tablet Take 137 mcg by mouth daily before breakfast.   Yes [provider]  loratadine (CLARITIN) 10 MG tablet Take 10 mg by mouth daily.   Yes [provider]  mirtazapine (REMERON) 15 MG tablet Take 15 mg by mouth at bedtime as needed (sleep).    Yes [provider]  Multiple Vitamins-Minerals (MULTIVITAMIN WITH MINERALS) tablet Take 1 tablet by mouth daily.   Yes [provider]  omeprazole (PRILOSEC) 20 MG capsule Take 20 mg by mouth daily.   Yes [provider]  traZODone (DESYREL) 50 MG tablet Take 50 mg by mouth at bedtime as needed for sleep.   Yes [provider]     Family History  Problem Relation Age of Onset  . Hypertension Mother   . Diabetes Mother   . Seizures Father     Social History   Socioeconomic History  . Marital status: Married    Spouse name: Not on file  . Number of children: Not on file  .  Years of education: Not on file  . Highest education level: Not on file  Occupational History  . Not on file  Tobacco Use  . Smoking status: Current Every Day Smoker    Packs/day: 0.50    Types: Cigarettes    Last attempt to quit: 09/03/2014    Years since quitting: 5.5  . Smokeless tobacco: Never Used  Substance and Sexual Activity  . Alcohol use: Yes    Comment: Everyother day   . Drug use: No  . Sexual activity: Not Currently    Birth control/protection: None  Other Topics  Concern  . Not on file  Social History Narrative  . Not on file   Social Determinants of Health   Financial Resource Strain:   . Difficulty of Paying Living Expenses:   Food Insecurity:   . Worried About Charity fundraiser in the Last Year:   . Arboriculturist in the Last Year:   Transportation Needs:   . Film/video editor (Medical):   Marland Kitchen Lack of Transportation (Non-Medical):   Physical Activity:   . Days of Exercise per Week:   . Minutes of Exercise per Session:   Stress:   . Feeling of Stress :   Social Connections:   . Frequency of Communication with Friends and Family:   . Frequency of Social Gatherings with Friends and Family:   . Attends Religious Services:   . Active Member of Clubs or Organizations:   . Attends Archivist Meetings:   Marland Kitchen Marital Status:       Review of Systems: A 12 point ROS discussed and pertinent positives are indicated in the HPI above.  All other systems are negative.  Review of Systems  Constitutional: Positive for appetite change and unexpected weight change. Negative for fatigue and fever.  Respiratory: Negative for cough and shortness of breath.   Cardiovascular: Negative for chest pain.  Gastrointestinal: Positive for nausea. Negative for abdominal pain.  Neurological: Positive for weakness.  Psychiatric/Behavioral: Negative for behavioral problems and confusion.    Vital Signs: BP (!) 132/91   Pulse 68   Temp 97.7 F (36.5 C) (Skin)   Resp 18   Ht 5' 7"  (1.702 m)   Wt 122 lb (55.3 kg)   SpO2 99%   BMI 19.11 kg/m   Physical Exam Vitals reviewed.  Cardiovascular:     Rate and Rhythm: Normal rate and regular rhythm.     Heart sounds: Normal heart sounds.  Pulmonary:     Effort: Pulmonary effort is normal.     Breath sounds: Normal breath sounds.  Abdominal:     Palpations: Abdomen is soft.  Musculoskeletal:        General: Normal range of motion.  Skin:    General: Skin is warm and dry.  Neurological:      Mental Status: He is alert and oriented to person, place, and time.  Psychiatric:        Behavior: Behavior normal.        Thought Content: Thought content normal.        Judgment: Judgment normal.     Imaging: CT ABDOMEN PELVIS W CONTRAST  Result Date: 03/05/2020 CLINICAL DATA:  Fever and abdominal pain. Internal external biliary drain placement for CBD obstruction placed a month ago. EXAM: CT ABDOMEN AND PELVIS WITH CONTRAST TECHNIQUE: Multidetector CT imaging of the abdomen and pelvis was performed using the standard protocol following bolus administration of intravenous contrast. CONTRAST:  119m OMNIPAQUE  IOHEXOL 300 MG/ML  SOLN COMPARISON:  MRCP dated February 03, 2020. CT abdomen pelvis dated February 02, 2020. FINDINGS: Lower chest: No acute abnormality. Minimal right greater than left lower lobe subsegmental atelectasis. Hepatobiliary: No focal liver abnormality. Interval placement of a internal external biliary drain in good position, with resolved intra-and extrahepatic biliary dilatation. Persistent significant wall thickening of the gallbladder measuring up to 8 mm. No radiopaque gallstones. Pancreas: Unremarkable. No pancreatic ductal dilatation or surrounding inflammatory changes. Spleen: Normal in size without focal abnormality. Adrenals/Urinary Tract: The adrenal glands are unremarkable. Unchanged focal scarring in the upper poles of both kidneys. Unchanged 1.3 cm simple cyst in the left kidney. Unchanged 3.8 x 2.6 cm complex cystic lesion in the right kidney with multiple thin internal septations and coarse, thick mural calcification in the posterior medial aspect of the lesion. While it has increased in size since 2015, the lesion demonstrates no morphologic changes since then. Other tiny subcentimeter low-density lesions in the right kidney remain too small to characterize. No renal calculi or hydronephrosis. The bladder is unremarkable. Stomach/Bowel: Stomach is within normal limits.  Appendix appears normal. No evidence of bowel wall thickening, distention, or inflammatory changes. Vascular/Lymphatic: Aortic atherosclerosis. No enlarged abdominal or pelvic lymph nodes. Reproductive: Unchanged prostatomegaly. Other: Unchanged tiny fat containing left inguinal hernia. No free fluid or pneumoperitoneum. Musculoskeletal: No acute or significant osseous findings. IMPRESSION: 1. Interval placement of an internal external biliary drain in good position, with resolved intra-and extrahepatic biliary dilatation. 2. 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 3. 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 While this lesion has increased in size since 2015, it demonstrates no morphologic changes since then. No further follow-up required. 4. Aortic Atherosclerosis (ICD10-I70.0). Electronically Signed   By: Titus Dubin M.D.   On: 03/05/2020 10:41    Labs:  CBC: Recent Labs    02/07/20 0327 02/08/20 0232 02/09/20 0155 03/05/20 0836  WBC 9.4 9.2 8.9 8.1  HGB 11.8* 13.3 14.4 13.7  HCT 35.6* 39.8 42.5 41.7  PLT 263 303 370 138*    COAGS: Recent Labs    02/02/20 2143 02/03/20 0325 02/04/20 0808  INR 1.2 1.3* 1.2    BMP: Recent Labs    02/07/20 0327 02/08/20 0232 02/09/20 0155 03/05/20 0836  NA 138 136 137 137  K 3.2* 3.7 3.6 3.8  CL 107 105 103 98  CO2 23 23 24 24   GLUCOSE 98 90 91 130*  BUN 8 6* 7* 15  CALCIUM 8.2* 8.6* 9.1 9.7  CREATININE 0.71 0.71 0.86 1.05  GFRNONAA >60 >60 >60 >60  GFRAA >60 >60 >60 >60    LIVER FUNCTION TESTS: Recent Labs    02/07/20 0327 02/08/20 0232 02/09/20 0155 03/05/20 0836  BILITOT 1.4* 1.3* 1.4* 0.6  AST 29 21 20  216*  ALT 33 28 24 216*  ALKPHOS 146* 155* 143* 176*  PROT 6.0* 6.5 6.8 7.2  ALBUMIN 2.7* 2.8* 3.0* 3.5    TUMOR MARKERS: No results for input(s): AFPTM, CEA, CA199, CHROMGRNA in the last 8760 hours.  Assessment and  Plan:  Biliary obstruction Biliary drain placed in IR 02/06/20 Scheduled now for planned cholangiogram with possible biliary drain upsize Pt is aware of procedure benefits and risks including but not limited to Infection; bleeding; damage to surrounding structures. Agreeable to proceed Consent signed in chart  Thank you for this interesting consult.  I greatly enjoyed meeting [facility] and look  forward to participating in their care.  A copy of this report was sent to the requesting provider on this date.  Electronically Signed: Lavonia Drafts, PA-C 03/10/2020, 9:31 AM   I spent a total of    25 Minutes in face to face in clinical consultation, greater than 50% of which was counseling/coordinating care for biliary drain upsize

## 2020-03-10 NOTE — Progress Notes (Signed)
Rodney Franklin, PA called back states Dr Earleen Newport will call in pain meds for a few days, but the pt will need to follow up with Dr Watt Climes if needed. Pt wife informed.

## 2020-03-10 NOTE — Progress Notes (Signed)
Discharge instructions reviewed with pt and his wife (via telephone) both voice understanding. Pt wife has a question about where and when follow up will be. informed her that the papers have his appointment high lighted and I did review with her the date and time also that the address and phone number Is on the paperwork

## 2020-03-10 NOTE — Progress Notes (Signed)
Spoke with pt wife she feels that the pt needs antibiotics Attempted to expained to her how antibiotics work, and that he had a dose in the procedure that was given IV. Also asks for pain meds. Jannifer Franklin, PA called and she will speak with Dr Earleen Newport.

## 2020-03-10 NOTE — Procedures (Signed)
Interventional Radiology Procedure Note  Procedure: Routine exchange/upsize of 58F int/ext drain to a 44F drain.    Complications: None  Recommendations:  - to gravity - Do not submerge - Routine care  - DC 1 hour when goals met - Follow up in clinic with fluoro injection and CMP to check bili, and possible capping of drain.  Follow up can be in 1-2 weeks.    Signed,  Dulcy Fanny. Earleen Newport, DO

## 2020-03-10 NOTE — Discharge Instructions (Signed)
 Biliary Drainage Catheter Placement, Care After This sheet gives you information about how to care for yourself after your procedure. Your health care provider may also give you more specific instructions. If you have problems or questions, contact your health care provider. What can I expect after the procedure? After the procedure, it is common to have:  Pain or soreness at the catheter insertion site.  Tiredness and sleepiness for several hours.  Some bruising at the catheter insertion site.  Drainage into the collection bag on the outside of your body, if you have an external drainage catheter. ? You might see bloody discharge in the bag for the first 1 or 2 days. ? Then, the discharge should turn a yellow-green color. Follow these instructions at home: Medicines   Take over-the-counter and prescription medicines for pain, discomfort, or fever only as told by your health care provider.  Do not take aspirin or blood thinners unless your health care provider says that you can. These can make bleeding worse.  Do not drive or use heavy machinery while taking prescription pain medicine. Catheter insertion site care  Clean the catheter insertion site as told by your health care provider.  Do not take baths, swim, or use a hot tub until your health care provider approves.  Take showers only. Before showering, cover the catheter insertion area with a watertight covering to keep the area dry.  Keep the skin around the catheter insertion site dry. If the area gets wet, dry the skin completely.  Check your catheter insertion site every day for signs of infection. Check for: ? Redness, swelling, or pain. ? Fluid or blood. ? Warmth. ? Pus or a bad smell. General instructions   Rest for the remainder of the day.  Do not drive, use machinery, or make legal decisions for 24 hours after your procedure.  Resume your usual diet. Avoid alcoholic beverages for 24 hours after your  procedure.  Keep all follow-up visits as told by your health care provider. This is important.  Drink enough fluid to keep your urine clear or pale yellow. Contact a health care provider if:  Your pain gets worse after it had improved, and it is not relieved with pain medicines.  You have any questions about caring for your drainage catheter or collection bag.  You have any of these around your catheter insertion site or coming from it: ? Skin breakdown. ? Redness, swelling, or pain. ? Fluid or blood. ? Warmth to the touch. ? Pus or a bad smell. Get help right away if:  You have a fever or chills.  Your redness, swelling, or pain at the catheter insertion site gets worse, even though you are cleaning it well.  You have leakage of bile around the drainage catheter.  Your drainage catheter becomes blocked or clogged.  Your drainage catheter comes out. This information is not intended to replace advice given to you by your health care provider. Make sure you discuss any questions you have with your health care provider. Document Revised: 11/02/2017 Document Reviewed: 10/09/2016 Elsevier Patient Education  2020 [employer]. Moderate Conscious Sedation, Adult, Care After These instructions provide you with information about caring for yourself after your procedure. Your health care provider may also give you more specific instructions. Your treatment has been planned according to current medical practices, but problems sometimes occur. Call your health care provider if you have any problems or questions after your procedure. What can I expect after the procedure? After   your procedure, it is common:  To feel sleepy for several hours.  To feel clumsy and have poor balance for several hours.  To have poor judgment for several hours.  To vomit if you eat too soon. Follow these instructions at home: For at least 24 hours after the procedure:   Do not: ? Participate in  activities where you could fall or become injured. ? Drive. ? Use heavy machinery. ? Drink alcohol. ? Take sleeping pills or medicines that cause drowsiness. ? Make important decisions or sign legal documents. ? Take care of children on your own.  Rest. Eating and drinking  Follow the diet recommended by your health care provider.  If you vomit: ? Drink water, juice, or soup when you can drink without vomiting. ? Make sure you have little or no nausea before eating solid foods. General instructions  Have a responsible adult stay with you until you are awake and alert.  Take over-the-counter and prescription medicines only as told by your health care provider.  If you smoke, do not smoke without supervision.  Keep all follow-up visits as told by your health care provider. This is important. Contact a health care provider if:  You keep feeling nauseous or you keep vomiting.  You feel light-headed.  You develop a rash.  You have a fever. Get help right away if:  You have trouble breathing. This information is not intended to replace advice given to you by your health care provider. Make sure you discuss any questions you have with your health care provider. Document Revised: 11/02/2017 Document Reviewed: 03/11/2016 Elsevier Patient Education  2020 [employer] Inc.  

## 2020-03-19 ENCOUNTER — Inpatient Hospital Stay (HOSPITAL_COMMUNITY)
Admission: EM | Admit: 2020-03-19 | Discharge: 2020-03-22 | DRG: 444 | Disposition: A | Discharge status: Home / Self Care | Payer: Medicare Other | Attending: Student | Admitting: Student

## 2020-03-19 ENCOUNTER — Encounter (HOSPITAL_COMMUNITY): Payer: Self-pay | Admitting: Emergency Medicine

## 2020-03-19 ENCOUNTER — Other Ambulatory Visit: Payer: Self-pay

## 2020-03-19 DIAGNOSIS — C221 Intrahepatic bile duct carcinoma: Secondary | ICD-10-CM | POA: Diagnosis present

## 2020-03-19 DIAGNOSIS — K831 Obstruction of bile duct: Secondary | ICD-10-CM | POA: Diagnosis present

## 2020-03-19 DIAGNOSIS — K5900 Constipation, unspecified: Secondary | ICD-10-CM | POA: Diagnosis present

## 2020-03-19 DIAGNOSIS — F4310 Post-traumatic stress disorder, unspecified: Secondary | ICD-10-CM | POA: Diagnosis present

## 2020-03-19 DIAGNOSIS — K869 Disease of pancreas, unspecified: Secondary | ICD-10-CM | POA: Diagnosis present

## 2020-03-19 DIAGNOSIS — Z8249 Family history of ischemic heart disease and other diseases of the circulatory system: Secondary | ICD-10-CM

## 2020-03-19 DIAGNOSIS — K810 Acute cholecystitis: Secondary | ICD-10-CM | POA: Diagnosis not present

## 2020-03-19 DIAGNOSIS — R748 Abnormal levels of other serum enzymes: Secondary | ICD-10-CM | POA: Diagnosis present

## 2020-03-19 DIAGNOSIS — I10 Essential (primary) hypertension: Secondary | ICD-10-CM | POA: Diagnosis present

## 2020-03-19 DIAGNOSIS — E43 Unspecified severe protein-calorie malnutrition: Secondary | ICD-10-CM | POA: Diagnosis present

## 2020-03-19 DIAGNOSIS — N179 Acute kidney failure, unspecified: Secondary | ICD-10-CM | POA: Diagnosis present

## 2020-03-19 DIAGNOSIS — E039 Hypothyroidism, unspecified: Secondary | ICD-10-CM | POA: Diagnosis present

## 2020-03-19 DIAGNOSIS — Z79890 Hormone replacement therapy (postmenopausal): Secondary | ICD-10-CM

## 2020-03-19 DIAGNOSIS — Z79891 Long term (current) use of opiate analgesic: Secondary | ICD-10-CM

## 2020-03-19 DIAGNOSIS — Z79899 Other long term (current) drug therapy: Secondary | ICD-10-CM

## 2020-03-19 DIAGNOSIS — E861 Hypovolemia: Secondary | ICD-10-CM | POA: Diagnosis present

## 2020-03-19 DIAGNOSIS — F17210 Nicotine dependence, cigarettes, uncomplicated: Secondary | ICD-10-CM | POA: Diagnosis present

## 2020-03-19 DIAGNOSIS — Z681 Body mass index (BMI) 19 or less, adult: Secondary | ICD-10-CM

## 2020-03-19 DIAGNOSIS — F319 Bipolar disorder, unspecified: Secondary | ICD-10-CM | POA: Diagnosis present

## 2020-03-19 DIAGNOSIS — K219 Gastro-esophageal reflux disease without esophagitis: Secondary | ICD-10-CM | POA: Diagnosis present

## 2020-03-19 DIAGNOSIS — Z833 Family history of diabetes mellitus: Secondary | ICD-10-CM

## 2020-03-19 DIAGNOSIS — E871 Hypo-osmolality and hyponatremia: Principal | ICD-10-CM | POA: Diagnosis present

## 2020-03-19 DIAGNOSIS — Z7982 Long term (current) use of aspirin: Secondary | ICD-10-CM

## 2020-03-19 DIAGNOSIS — Z20822 Contact with and (suspected) exposure to covid-19: Secondary | ICD-10-CM | POA: Diagnosis present

## 2020-03-19 DIAGNOSIS — K819 Cholecystitis, unspecified: Secondary | ICD-10-CM | POA: Diagnosis not present

## 2020-03-19 DIAGNOSIS — R61 Generalized hyperhidrosis: Secondary | ICD-10-CM | POA: Diagnosis present

## 2020-03-19 DIAGNOSIS — R946 Abnormal results of thyroid function studies: Secondary | ICD-10-CM | POA: Diagnosis present

## 2020-03-19 DIAGNOSIS — E860 Dehydration: Secondary | ICD-10-CM | POA: Diagnosis present

## 2020-03-19 LAB — URINALYSIS, ROUTINE W REFLEX MICROSCOPIC
Bilirubin Urine: NEGATIVE
Glucose, UA: NEGATIVE mg/dL
Hgb urine dipstick: NEGATIVE
Ketones, ur: NEGATIVE mg/dL
Leukocytes,Ua: NEGATIVE
Nitrite: NEGATIVE
Protein, ur: NEGATIVE mg/dL
Specific Gravity, Urine: 1.017 (ref 1.005–1.030)
pH: 5 (ref 5.0–8.0)

## 2020-03-19 LAB — COMPREHENSIVE METABOLIC PANEL
ALT: 38 U/L (ref 0–44)
AST: 26 U/L (ref 15–41)
Albumin: 4.1 g/dL (ref 3.5–5.0)
Alkaline Phosphatase: 221 U/L — ABNORMAL HIGH (ref 38–126)
Anion gap: 13 (ref 5–15)
BUN: 57 mg/dL — ABNORMAL HIGH (ref 8–23)
CO2: 18 mmol/L — ABNORMAL LOW (ref 22–32)
Calcium: 10.3 mg/dL (ref 8.9–10.3)
Chloride: 95 mmol/L — ABNORMAL LOW (ref 98–111)
Creatinine, Ser: 2.09 mg/dL — ABNORMAL HIGH (ref 0.61–1.24)
GFR calc Af Amer: 36 mL/min — ABNORMAL LOW (ref >60)
GFR calc non Af Amer: 31 mL/min — ABNORMAL LOW (ref >60)
Glucose, Bld: 116 mg/dL — ABNORMAL HIGH (ref 70–99)
Potassium: 5.1 mmol/L (ref 3.5–5.1)
Sodium: 126 mmol/L — ABNORMAL LOW (ref 135–145)
Total Bilirubin: 0.8 mg/dL (ref 0.3–1.2)
Total Protein: 9.3 g/dL — ABNORMAL HIGH (ref 6.5–8.1)

## 2020-03-19 LAB — CBC WITH DIFFERENTIAL/PLATELET
Abs Immature Granulocytes: 0.19 10*3/uL — ABNORMAL HIGH (ref 0.00–0.07)
Basophils Absolute: 0.1 10*3/uL (ref 0.0–0.1)
Basophils Relative: 0 %
Eosinophils Absolute: 0.1 10*3/uL (ref 0.0–0.5)
Eosinophils Relative: 1 %
HCT: 46.6 % (ref 39.0–52.0)
Hemoglobin: 15.5 g/dL (ref 13.0–17.0)
Immature Granulocytes: 1 %
Lymphocytes Relative: 19 %
Lymphs Abs: 2.7 10*3/uL (ref 0.7–4.0)
MCH: 23.4 pg — ABNORMAL LOW (ref 26.0–34.0)
MCHC: 33.3 g/dL (ref 30.0–36.0)
MCV: 70.4 fL — ABNORMAL LOW (ref 80.0–100.0)
Monocytes Absolute: 0.8 10*3/uL (ref 0.1–1.0)
Monocytes Relative: 5 %
Neutro Abs: 10.4 10*3/uL — ABNORMAL HIGH (ref 1.7–7.7)
Neutrophils Relative %: 74 %
Platelets: 578 10*3/uL — ABNORMAL HIGH (ref 150–400)
RBC: 6.62 MIL/uL — ABNORMAL HIGH (ref 4.22–5.81)
RDW: 14.1 % (ref 11.5–15.5)
WBC: 14.2 10*3/uL — ABNORMAL HIGH (ref 4.0–10.5)
nRBC: 0 % (ref 0.0–0.2)

## 2020-03-19 MED ORDER — SODIUM CHLORIDE 0.9 % IV SOLN: Freq: Once | INTRAVENOUS | Status: DC

## 2020-03-19 NOTE — ED Notes (Signed)
Pt unable to provide sample at this time.

## 2020-03-19 NOTE — ED Provider Notes (Signed)
[facility] EMERGENCY DEPARTMENT Provider Note   CSN: [id number] Arrival date & time: 03/19/20  1857     History Chief Complaint  Patient presents with  . Abnormal Lab    Rodney Romero. is a 74 y.o. male with history of hypertension, hypothyroidism, bipolar 1 disorder, PTSD presenting sent from the [place] for evaluation of abnormal lab work.  He tells me that he had blood work done today and was told that he was hyponatremic.  He has a percutaneous abdominal drain that was placed over 1 month ago for CBD obstruction.  He states that he is following up with GI and general surgery.  He denies significant abdominal pain but does note that he is a little bit constipated and has not had a bowel movement in 1 month.  He endorses night sweats but denies fevers.  Reports 6 pound weight loss in the last week.  Denies nausea, vomiting, urinary symptoms.  He is a poor historian.  Has been taking hydrocodone for his pain.   The history is provided by the patient and medical records. The history is limited by the condition of the patient.       Past Medical History:  Diagnosis Date  . Bipolar 1 disorder ([place])   . Hypertension   . Hypothyroidism   . PTSD (post-traumatic stress disorder)     Patient Active Problem List   Diagnosis Date Noted  . Common bile duct (CBD) obstruction 03/20/2020  . Hypothyroidism 03/20/2020  . GERD without esophagitis 03/20/2020  . Dehydration with hyponatremia 03/20/2020  . AKI (acute kidney injury) ([place]) 03/20/2020  . Cholecystitis, acute   . Essential hypertension 02/02/2020  . Closed tibia fracture 06/25/2018    Past Surgical History:  Procedure Laterality Date  . ERCP N/A 02/05/2020   Procedure: ENDOSCOPIC RETROGRADE CHOLANGIOPANCREATOGRAPHY (ERCP);  Surgeon: Clarene Essex, MD;  Location: [facility];  Service: Endoscopy;  Laterality: N/A;  . GALLBLADDER SURGERY    . IR ENDOLUMINAL BX OF BILIARY TREE  02/06/2020  . IR EXCHANGE BILIARY  DRAIN  03/10/2020  . IR INT EXT BILIARY DRAIN WITH CHOLANGIOGRAM  02/06/2020  . SPHINCTEROTOMY  02/05/2020   Procedure: SPHINCTEROTOMY;  Surgeon: Clarene Essex, MD;  Location: [facility] ENDOSCOPY;  Service: Endoscopy;;  . TIBIA IM NAIL INSERTION Left 06/26/2018   Procedure: left tibia intramedullary nail;  Surgeon: Gaynelle Arabian, MD;  Location: [facility];  Service: Orthopedics;  Laterality: Left;       Family History  Problem Relation Age of Onset  . Hypertension Mother   . Diabetes Mother   . Seizures Father     Social History   Tobacco Use  . Smoking status: Current Every Day Smoker    Packs/day: 0.50    Types: Cigarettes    Last attempt to quit: 09/03/2014    Years since quitting: 5.5  . Smokeless tobacco: Never Used  Substance Use Topics  . Alcohol use: Yes    Comment: Everyother day   . Drug use: No    Home Medications Prior to Admission medications   Medication Sig Start Date End Date Taking? Authorizing Provider  amLODipine (NORVASC) 10 MG tablet Take 10 mg by mouth daily.    [provider]  amoxicillin-clavulanate (AUGMENTIN) 875-125 MG tablet Take 1 tablet by mouth every 12 (twelve) hours. 03/05/20   Valarie Merino, MD  aspirin EC 81 MG tablet Take 81 mg by mouth daily.    [provider]  fluticasone (FLONASE) 50 MCG/ACT nasal  spray Place 1 spray into both nostrils daily.    [provider]  galantamine (RAZADYNE ER) 8 MG 24 hr capsule Take 8 mg by mouth daily with breakfast.    [provider]  HYDROcodone-acetaminophen (NORCO/VICODIN) 5-325 MG tablet Take 1-2 tablets by mouth every 6 (six) hours as needed for moderate pain or severe pain. 03/10/20   Monia Sabal, PA-C  levothyroxine (SYNTHROID, LEVOTHROID) 137 MCG tablet Take 137 mcg by mouth daily before breakfast.    [provider]  loratadine (CLARITIN) 10 MG tablet Take 10 mg by mouth daily.    [provider]  mirtazapine (REMERON) 15 MG tablet Take 15 mg by mouth at  bedtime as needed (sleep).     [provider]  Multiple Vitamins-Minerals (MULTIVITAMIN WITH MINERALS) tablet Take 1 tablet by mouth daily.    [provider]  omeprazole (PRILOSEC) 20 MG capsule Take 20 mg by mouth daily.    [provider]  traZODone (DESYREL) 50 MG tablet Take 50 mg by mouth at bedtime as needed for sleep.    [provider]    Allergies    Patient has no known allergies.  Review of Systems   Review of Systems  Constitutional:       +night sweats  Respiratory: Negative for shortness of breath.   Cardiovascular: Negative for chest pain.  Gastrointestinal: Positive for constipation. Negative for abdominal pain, nausea and vomiting.  Genitourinary: Negative for dysuria, hematuria and urgency.  All other systems reviewed and are negative.   Physical Exam Updated Vital Signs BP (!) 128/92   Pulse 88   Temp (!) 97.4 F (36.3 C) (Oral)   Resp 13   Ht 5' 7"  (1.702 m)   Wt 55.3 kg   SpO2 98%   BMI 19.09 kg/m   Physical Exam Vitals and nursing note reviewed.  Constitutional:      General: He is not in acute distress.    Appearance: He is well-developed.     Comments: Very thin, chronically ill in appearance  HENT:     Head: Normocephalic and atraumatic.     Mouth/Throat:     Mouth: Mucous membranes are dry.     Comments: Speaking in a mildly hoarse voice. Eyes:     General:        Right eye: No discharge.        Left eye: No discharge.     Conjunctiva/sclera: Conjunctivae normal.  Neck:     Vascular: No JVD.     Trachea: No tracheal deviation.  Cardiovascular:     Rate and Rhythm: Normal rate and regular rhythm.  Pulmonary:     Effort: Pulmonary effort is normal.     Breath sounds: Normal breath sounds.  Abdominal:     General: There is no distension.     Palpations: Abdomen is soft.     Comments: Percutaneous drain in the left upper quadrant draining bright yellow fluid  Skin:    General: Skin is warm and  dry.     Findings: No erythema.     Comments: Poor skin turgor  Neurological:     Mental Status: He is alert.  Psychiatric:        Behavior: Behavior normal.     ED Results / Procedures / Treatments   Labs (all labs ordered are listed, but only abnormal results are displayed) Labs Reviewed  CBC WITH DIFFERENTIAL/PLATELET - Abnormal; Notable for the following components:      Result  Value   WBC 14.2 (*)    RBC 6.62 (*)    MCV 70.4 (*)    MCH 23.4 (*)    Platelets 578 (*)    Neutro Abs 10.4 (*)    Abs Immature Granulocytes 0.19 (*)    All other components within normal limits  COMPREHENSIVE METABOLIC PANEL - Abnormal; Notable for the following components:   Sodium 126 (*)    Chloride 95 (*)    CO2 18 (*)    Glucose, Bld 116 (*)    BUN 57 (*)    Creatinine, Ser 2.09 (*)    Total Protein 9.3 (*)    Alkaline Phosphatase 221 (*)    GFR calc non Af Amer 31 (*)    GFR calc Af Amer 36 (*)    All other components within normal limits  URINALYSIS, ROUTINE W REFLEX MICROSCOPIC - Abnormal; Notable for the following components:   APPearance HAZY (*)    All other components within normal limits  SARS CORONAVIRUS 2 (TAT 6-24 HRS)  CULTURE, BLOOD (ROUTINE X 2)  CULTURE, BLOOD (ROUTINE X 2)  BODY FLUID CULTURE  RENAL FUNCTION PANEL  RENAL FUNCTION PANEL  RENAL FUNCTION PANEL  CANCER ANTIGEN 19-9  CEA  LACTIC ACID, PLASMA  C-REACTIVE PROTEIN  CYTOLOGY - NON PAP    EKG None  Radiology No results found.  Procedures .Critical Care Performed by: Renita Papa, PA-C Authorized by: Renita Papa, PA-C   Critical care provider statement:    Critical care time (minutes):  45   Critical care was necessary to treat or prevent imminent or life-threatening deterioration of the following conditions:  Renal failure   Critical care was time spent personally by me on the following activities:  Discussions with consultants, evaluation of patient's response to treatment, examination of  patient, ordering and performing treatments and interventions, ordering and review of laboratory studies, ordering and review of radiographic studies, pulse oximetry, re-evaluation of patient's condition, obtaining history from patient or surrogate and review of old charts   (including critical care time)  Medications Ordered in ED Medications  0.9 %  sodium chloride infusion ( Intravenous New Bag/Given 03/20/20 0100)  meropenem (MERREM) 1 g in sodium chloride 0.9 % 100 mL IVPB (has no administration in time range)    ED Course  I have reviewed the triage vital signs and the nursing notes.  Pertinent labs & imaging results that were available during my care of the patient were reviewed by me and considered in my medical decision making (see chart for details).    MDM Rules/Calculators/A&P                      Patient presents sent from the [facility] for evaluation of abnormal lab work. He is afebrile, vital signs are stable in the ED. He appears chronically ill, very thin. He has a percutaneous drain that was placed approximately 6 weeks ago for CBD obstruction. 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 Denies any abdominal pain at this time and abdominal examination is reassuring with no tenderness, no rebound or guarding. Lab work reviewed and interpreted by myself shows a leukocytosis of 14.2, no anemia, AKI with creatinine of 2.09 and BUN 57 which is essentially doubled from lab work that was obtained 2 weeks ago. He is also hyponatremic with a sodium of 126, down from 137 2 weeks ago. Suspect prerenal causes. He was started  on a normal saline infusion. UA does not suggest UTI or nephrolithiasis. His alk phos remains elevated but the remainder of his LFTs are improved and given reassuring abdominal examination I do not feel that he requires emergent imaging of the abdomen at this time. Spoke with Dr. Cyd Silence with [facility]  hospitalist service agrees to assume care of patient and bring him to the hospital for further evaluation and management.  Final Clinical Impression(s) / ED Diagnoses Final diagnoses:  Hyponatremia  AKI (acute kidney injury) [facility])    Rx / [place] Orders ED Discharge Orders    None       Debroah Baller 03/20/20 0123    Mesner, Corene Cornea, MD 03/20/20 [phone]

## 2020-03-19 NOTE — ED Triage Notes (Signed)
Pt states he was send here by [facility] for low NA, pt denies any pain, no dizziness, nausea or vomiting.

## 2020-03-19 NOTE — ED Notes (Signed)
Zachari Farooqi wife [phone] looking for an update on the pt

## 2020-03-20 ENCOUNTER — Inpatient Hospital Stay (HOSPITAL_COMMUNITY): Payer: Medicare Other

## 2020-03-20 ENCOUNTER — Encounter (HOSPITAL_COMMUNITY): Payer: Self-pay | Admitting: Internal Medicine

## 2020-03-20 DIAGNOSIS — Z833 Family history of diabetes mellitus: Secondary | ICD-10-CM | POA: Diagnosis not present

## 2020-03-20 DIAGNOSIS — N179 Acute kidney failure, unspecified: Secondary | ICD-10-CM

## 2020-03-20 DIAGNOSIS — F319 Bipolar disorder, unspecified: Secondary | ICD-10-CM | POA: Diagnosis present

## 2020-03-20 DIAGNOSIS — E871 Hypo-osmolality and hyponatremia: Secondary | ICD-10-CM

## 2020-03-20 DIAGNOSIS — I10 Essential (primary) hypertension: Secondary | ICD-10-CM

## 2020-03-20 DIAGNOSIS — F4310 Post-traumatic stress disorder, unspecified: Secondary | ICD-10-CM | POA: Diagnosis present

## 2020-03-20 DIAGNOSIS — E039 Hypothyroidism, unspecified: Secondary | ICD-10-CM

## 2020-03-20 DIAGNOSIS — K819 Cholecystitis, unspecified: Secondary | ICD-10-CM | POA: Diagnosis present

## 2020-03-20 DIAGNOSIS — R61 Generalized hyperhidrosis: Secondary | ICD-10-CM | POA: Diagnosis present

## 2020-03-20 DIAGNOSIS — R946 Abnormal results of thyroid function studies: Secondary | ICD-10-CM | POA: Diagnosis present

## 2020-03-20 DIAGNOSIS — Z681 Body mass index (BMI) 19 or less, adult: Secondary | ICD-10-CM | POA: Diagnosis not present

## 2020-03-20 DIAGNOSIS — Z20822 Contact with and (suspected) exposure to covid-19: Secondary | ICD-10-CM | POA: Diagnosis present

## 2020-03-20 DIAGNOSIS — E43 Unspecified severe protein-calorie malnutrition: Secondary | ICD-10-CM | POA: Diagnosis present

## 2020-03-20 DIAGNOSIS — K810 Acute cholecystitis: Principal | ICD-10-CM

## 2020-03-20 DIAGNOSIS — K219 Gastro-esophageal reflux disease without esophagitis: Secondary | ICD-10-CM | POA: Diagnosis present

## 2020-03-20 DIAGNOSIS — K869 Disease of pancreas, unspecified: Secondary | ICD-10-CM | POA: Diagnosis present

## 2020-03-20 DIAGNOSIS — E860 Dehydration: Secondary | ICD-10-CM

## 2020-03-20 DIAGNOSIS — C221 Intrahepatic bile duct carcinoma: Secondary | ICD-10-CM | POA: Diagnosis present

## 2020-03-20 DIAGNOSIS — K5900 Constipation, unspecified: Secondary | ICD-10-CM | POA: Diagnosis present

## 2020-03-20 DIAGNOSIS — Z79899 Other long term (current) drug therapy: Secondary | ICD-10-CM | POA: Diagnosis not present

## 2020-03-20 DIAGNOSIS — Z8249 Family history of ischemic heart disease and other diseases of the circulatory system: Secondary | ICD-10-CM | POA: Diagnosis not present

## 2020-03-20 DIAGNOSIS — K831 Obstruction of bile duct: Secondary | ICD-10-CM

## 2020-03-20 DIAGNOSIS — E861 Hypovolemia: Secondary | ICD-10-CM | POA: Diagnosis present

## 2020-03-20 DIAGNOSIS — R5381 Other malaise: Secondary | ICD-10-CM

## 2020-03-20 DIAGNOSIS — R748 Abnormal levels of other serum enzymes: Secondary | ICD-10-CM | POA: Diagnosis present

## 2020-03-20 DIAGNOSIS — R531 Weakness: Secondary | ICD-10-CM

## 2020-03-20 DIAGNOSIS — F17210 Nicotine dependence, cigarettes, uncomplicated: Secondary | ICD-10-CM | POA: Diagnosis present

## 2020-03-20 LAB — COMPREHENSIVE METABOLIC PANEL
ALT: 36 U/L (ref 0–44)
AST: 22 U/L (ref 15–41)
Albumin: 4.2 g/dL (ref 3.5–5.0)
Alkaline Phosphatase: 203 U/L — ABNORMAL HIGH (ref 38–126)
Anion gap: 13 (ref 5–15)
BUN: 56 mg/dL — ABNORMAL HIGH (ref 8–23)
CO2: 18 mmol/L — ABNORMAL LOW (ref 22–32)
Calcium: 10 mg/dL (ref 8.9–10.3)
Chloride: 95 mmol/L — ABNORMAL LOW (ref 98–111)
Creatinine, Ser: 1.76 mg/dL — ABNORMAL HIGH (ref 0.61–1.24)
GFR calc Af Amer: 44 mL/min — ABNORMAL LOW (ref >60)
GFR calc non Af Amer: 38 mL/min — ABNORMAL LOW (ref >60)
Glucose, Bld: 104 mg/dL — ABNORMAL HIGH (ref 70–99)
Potassium: 4.7 mmol/L (ref 3.5–5.1)
Sodium: 126 mmol/L — ABNORMAL LOW (ref 135–145)
Total Bilirubin: 1 mg/dL (ref 0.3–1.2)
Total Protein: 8.9 g/dL — ABNORMAL HIGH (ref 6.5–8.1)

## 2020-03-20 LAB — RENAL FUNCTION PANEL
Albumin: 4.1 g/dL (ref 3.5–5.0)
Albumin: 3.9 g/dL (ref 3.5–5.0)
Albumin: 3.6 g/dL (ref 3.5–5.0)
Anion gap: 13 (ref 5–15)
Anion gap: 8 (ref 5–15)
Anion gap: 12 (ref 5–15)
BUN: 58 mg/dL — ABNORMAL HIGH (ref 8–23)
BUN: 52 mg/dL — ABNORMAL HIGH (ref 8–23)
BUN: 46 mg/dL — ABNORMAL HIGH (ref 8–23)
CO2: 20 mmol/L — ABNORMAL LOW (ref 22–32)
CO2: 25 mmol/L (ref 22–32)
CO2: 18 mmol/L — ABNORMAL LOW (ref 22–32)
Calcium: 10.3 mg/dL (ref 8.9–10.3)
Calcium: 10 mg/dL (ref 8.9–10.3)
Calcium: 9.4 mg/dL (ref 8.9–10.3)
Chloride: 95 mmol/L — ABNORMAL LOW (ref 98–111)
Chloride: 95 mmol/L — ABNORMAL LOW (ref 98–111)
Chloride: 98 mmol/L (ref 98–111)
Creatinine, Ser: 1.67 mg/dL — ABNORMAL HIGH (ref 0.61–1.24)
Creatinine, Ser: 1.66 mg/dL — ABNORMAL HIGH (ref 0.61–1.24)
Creatinine, Ser: 1.32 mg/dL — ABNORMAL HIGH (ref 0.61–1.24)
GFR calc Af Amer: 47 mL/min — ABNORMAL LOW (ref >60)
GFR calc Af Amer: 47 mL/min — ABNORMAL LOW (ref >60)
GFR calc Af Amer: >60 mL/min (ref >60)
GFR calc non Af Amer: 40 mL/min — ABNORMAL LOW (ref >60)
GFR calc non Af Amer: 41 mL/min — ABNORMAL LOW (ref >60)
GFR calc non Af Amer: 54 mL/min — ABNORMAL LOW (ref >60)
Glucose, Bld: 107 mg/dL — ABNORMAL HIGH (ref 70–99)
Glucose, Bld: 134 mg/dL — ABNORMAL HIGH (ref 70–99)
Glucose, Bld: 95 mg/dL (ref 70–99)
Phosphorus: 5.1 mg/dL — ABNORMAL HIGH (ref 2.5–4.6)
Phosphorus: 4.1 mg/dL (ref 2.5–4.6)
Phosphorus: 3.7 mg/dL (ref 2.5–4.6)
Potassium: 4.9 mmol/L (ref 3.5–5.1)
Potassium: 4.1 mmol/L (ref 3.5–5.1)
Potassium: 5.2 mmol/L — ABNORMAL HIGH (ref 3.5–5.1)
Sodium: 128 mmol/L — ABNORMAL LOW (ref 135–145)
Sodium: 128 mmol/L — ABNORMAL LOW (ref 135–145)
Sodium: 128 mmol/L — ABNORMAL LOW (ref 135–145)

## 2020-03-20 LAB — CBC WITH DIFFERENTIAL/PLATELET
Abs Immature Granulocytes: 0.11 K/uL — ABNORMAL HIGH (ref 0.00–0.07)
Basophils Absolute: 0.1 K/uL (ref 0.0–0.1)
Basophils Relative: 1 %
Eosinophils Absolute: 0.1 K/uL (ref 0.0–0.5)
Eosinophils Relative: 1 %
HCT: 46.3 % (ref 39.0–52.0)
Hemoglobin: 15.1 g/dL (ref 13.0–17.0)
Immature Granulocytes: 1 %
Lymphocytes Relative: 19 %
Lymphs Abs: 2.5 K/uL (ref 0.7–4.0)
MCH: 22.9 pg — ABNORMAL LOW (ref 26.0–34.0)
MCHC: 32.6 g/dL (ref 30.0–36.0)
MCV: 70.4 fL — ABNORMAL LOW (ref 80.0–100.0)
Monocytes Absolute: 0.6 K/uL (ref 0.1–1.0)
Monocytes Relative: 5 %
Neutro Abs: 9.8 K/uL — ABNORMAL HIGH (ref 1.7–7.7)
Neutrophils Relative %: 73 %
Platelets: 545 K/uL — ABNORMAL HIGH (ref 150–400)
RBC: 6.58 MIL/uL — ABNORMAL HIGH (ref 4.22–5.81)
RDW: 14.4 % (ref 11.5–15.5)
WBC: 13.3 K/uL — ABNORMAL HIGH (ref 4.0–10.5)
nRBC: 0 % (ref 0.0–0.2)

## 2020-03-20 LAB — SARS CORONAVIRUS 2 (TAT 6-24 HRS): SARS Coronavirus 2: NEGATIVE

## 2020-03-20 LAB — SODIUM, URINE, RANDOM: Sodium, Ur: <10 mmol/L

## 2020-03-20 LAB — OSMOLALITY, URINE: Osmolality, Ur: 623 mOsm/kg (ref 300–900)

## 2020-03-20 LAB — C-REACTIVE PROTEIN: CRP: 1 mg/dL — ABNORMAL HIGH

## 2020-03-20 LAB — LACTIC ACID, PLASMA: Lactic Acid, Venous: 1.2 mmol/L (ref 0.5–1.9)

## 2020-03-20 MED ORDER — SODIUM CHLORIDE 0.9 % IV SOLN
1.0000 g | Freq: Two times a day (BID) | INTRAVENOUS | Status: DC
  Administered 2020-03-20 – 2020-03-21 (×4): 1 g via INTRAVENOUS
  Filled 2020-03-20 (×8): qty 1

## 2020-03-20 MED ORDER — ADULT MULTIVITAMIN W/MINERALS CH
1.0000 | ORAL_TABLET | Freq: Every day | ORAL | Status: DC
  Administered 2020-03-20 – 2020-03-22 (×3): 1 via ORAL
  Filled 2020-03-20 (×3): qty 1

## 2020-03-20 MED ORDER — LEVOTHYROXINE SODIUM 25 MCG PO TABS
137.0000 ug | ORAL_TABLET | Freq: Every day | ORAL | Status: DC
  Administered 2020-03-20 – 2020-03-22 (×3): 137 ug via ORAL
  Filled 2020-03-20 (×3): qty 1

## 2020-03-20 MED ORDER — POLYETHYLENE GLYCOL 3350 17 G PO PACK: 17.0000 g | PACK | Freq: Every day | ORAL | Status: DC | PRN

## 2020-03-20 MED ORDER — ONDANSETRON HCL 4 MG/2ML IJ SOLN: 4.0000 mg | Freq: Four times a day (QID) | INTRAMUSCULAR | Status: DC | PRN

## 2020-03-20 MED ORDER — ASPIRIN EC 81 MG PO TBEC
81.0000 mg | DELAYED_RELEASE_TABLET | Freq: Every day | ORAL | Status: DC
  Administered 2020-03-20 – 2020-03-22 (×3): 81 mg via ORAL
  Filled 2020-03-20 (×3): qty 1

## 2020-03-20 MED ORDER — PANTOPRAZOLE SODIUM 40 MG PO TBEC
40.0000 mg | DELAYED_RELEASE_TABLET | Freq: Every day | ORAL | Status: DC
  Administered 2020-03-20 – 2020-03-22 (×3): 40 mg via ORAL
  Filled 2020-03-20 (×3): qty 1

## 2020-03-20 MED ORDER — ENOXAPARIN SODIUM 30 MG/0.3ML ~~LOC~~ SOLN
30.0000 mg | SUBCUTANEOUS | Status: DC
  Administered 2020-03-20 – 2020-03-22 (×3): 30 mg via SUBCUTANEOUS
  Filled 2020-03-20 (×3): qty 0.3

## 2020-03-20 MED ORDER — FLUTICASONE PROPIONATE 50 MCG/ACT NA SUSP
1.0000 | Freq: Every day | NASAL | Status: DC
  Administered 2020-03-22: 1 via NASAL
  Filled 2020-03-20: qty 16

## 2020-03-20 MED ORDER — ACETAMINOPHEN 325 MG PO TABS: 650.0000 mg | ORAL_TABLET | Freq: Four times a day (QID) | ORAL | Status: DC | PRN

## 2020-03-20 MED ORDER — ACETAMINOPHEN 650 MG RE SUPP: 650.0000 mg | Freq: Four times a day (QID) | RECTAL | Status: DC | PRN

## 2020-03-20 MED ORDER — HYDROCODONE-ACETAMINOPHEN 5-325 MG PO TABS: 1.0000 | ORAL_TABLET | Freq: Four times a day (QID) | ORAL | Status: DC | PRN

## 2020-03-20 MED ORDER — ONDANSETRON HCL 4 MG PO TABS: 4.0000 mg | ORAL_TABLET | Freq: Four times a day (QID) | ORAL | Status: DC | PRN

## 2020-03-20 MED ORDER — SODIUM CHLORIDE 0.9 % IV SOLN: INTRAVENOUS | Status: DC

## 2020-03-20 MED ORDER — LORATADINE 10 MG PO TABS
10.0000 mg | ORAL_TABLET | Freq: Every day | ORAL | Status: DC
  Administered 2020-03-20 – 2020-03-22 (×3): 10 mg via ORAL
  Filled 2020-03-20 (×3): qty 1

## 2020-03-20 MED ORDER — TRAZODONE HCL 50 MG PO TABS: 50.0000 mg | ORAL_TABLET | Freq: Every evening | ORAL | Status: DC | PRN

## 2020-03-20 MED ORDER — GADOBUTROL 1 MMOL/ML IV SOLN
6.0000 mL | Freq: Once | INTRAVENOUS | Status: AC | PRN
  Administered 2020-03-20: 6 mL via INTRAVENOUS

## 2020-03-20 MED ORDER — AMLODIPINE BESYLATE 10 MG PO TABS
10.0000 mg | ORAL_TABLET | Freq: Every day | ORAL | Status: DC
  Administered 2020-03-20 – 2020-03-22 (×3): 10 mg via ORAL
  Filled 2020-03-20 (×3): qty 1

## 2020-03-20 NOTE — ED Notes (Signed)
Requested abx from pharmacy

## 2020-03-20 NOTE — ED Notes (Signed)
MS   bfast ordered  

## 2020-03-20 NOTE — Progress Notes (Signed)
PROGRESS NOTE  Rodney Romero. [id number] DOB: Mar 16, 1946   PCP: [facility]  Patient is from: Home  DOA: 03/19/2020 LOS: 0  Brief Narrative / Interim history: 74 year old male with history of cholecystitis/common bile duct obstruction s/p percutaneous drain, HTN, hypothyroidism and GERD presenting with generalized weakness, unintentional weight loss, poor p.o. intake, nausea, hyponatremia and AKI.  Apparently, he presented to PCP and had a blood work.  Was told to have hyponatremia and AKI and directed to ED by PCP.  Of note, patient was hospitalized here from 3/2-3/9 for acute cholecystitis/CBD obstruction and underwent percutaneous drain and biliary stent placement.  Brush biopsy at that time negative for malignancy.  He was seen in [facility] ED on 4/2 for abdominal pain and cleared for discharge by general surgery home to follow-up with GI outpatient.  Patient was to follow-up with [name] at [facility] surgery on 4/19 but presented to PCP on 4/17 and found to have hyponatremia and AKI.  In ED, hemodynamically stable. Na 126.  Creatinine 2.09 (baseline 1.0).  BUN 57.  ALP 221.  Total protein 9.3.  Albumin 4.1.  WBC 14.2.  Platelet 578.  UA without significant finding.  COVID-19 PCR negative.  He was started on meropenem for presumed acute cholecystitis and admitted.  MRCP ordered.  MRCP concerning for new or progressive soft tissue mass centered in the porta hepatis and likely involving the pancreatic head concerning for cholangiocarcinoma.  [facility] consulted.  Subjective: Seen and examined earlier this morning.  No major events overnight of this morning.  No complaints at this time.  Admits to unintentional weight loss and poor p.o. intake.  Oriented x3.  Not a great historian but some insight into his situation.  Objective: Vitals:   03/20/20 0605 03/20/20 0700 03/20/20 0725 03/20/20 0842  BP: 133/87 120/86 113/83 131/86  Pulse: 65 67 70 68  Resp: 16   16  Temp:    98.1 F  (36.7 C)  TempSrc:    Oral  SpO2: 98% 98% 100% 99%  Weight:      Height:       No intake or output data in the 24 hours ending 03/20/20 1133 Filed Weights   03/19/20 1936  Weight: 55.3 kg    Examination:  GENERAL: Frail and chronically ill-appearing. HEENT: MMM.  Vision and hearing grossly intact.  NECK: Supple.  No apparent JVD.  RESP:  No IWOB. Good air movement bilaterally. CVS:  RRR. Heart sounds normal.  ABD/GI/GU: BS present. Soft. Non tender.  Percutaneous drain with slight green output in the buttock MSK/EXT:  Moves extremities. No apparent deformity.  Significant muscle mass and subcu fat loss. SKIN: no apparent skin lesion or wound NEURO: Awake, alert and oriented appropriately.  No apparent focal neuro deficit. PSYCH: Calm. Normal affect.   Procedures:  None  Microbiology summarized: COVID-19 PCR negative. Blood cultures NGTD Biliary fluid culture pending.  Assessment & Plan: Acute cholecystitis with common bile duct obstruction status post percutaneous drain placement by IR on 3/5 and ERCP with biliary sphincterectomy and brush biopsy on 3/4.  Pathology with brush biopsy negative for malignancy at that time.  His CA 19-9 and CEA were elevated.  He had drain exchange by IR on 4/7.  Returns with unintentional weight loss, poor p.o. intake, nausea, hyponatremia and AKI.  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  No obvious signs of infection but mild leukocytosis.  Blood cultures NGTD. [facility] consulted. -Continue meropenem pending evaluation by GI-I doubt he needs  antibiotic at this time. -Follow repeat CA 19-9 and CEA. -Trend leukocytosis-improving. -Follow biliary cultures.  Hypovolemic hyponatremia: Likely due to dehydration in the setting of poor p.o. intake.  Improving. -Continue IV NS -Check urine chemistry  Acute kidney injury/azotemia: Likely prerenal in the setting of dehydration/poor p.o. intake.  Not on nephrotoxic meds.  Baseline creatinine~1.0>  2.09 (admit)> 1.66 -Continue IVF -Continue monitoring -Avoid nephrotoxic meds.  Non-anion gap metabolic acidosis: Likely due to AKI and dehydration.  Resolved.  Elevated alkaline phosphatase: Likely due to #1. -Continue monitoring  Hyperproteinemia/elevated protein gap: suspect some element of hemoconcentration in the setting of dehydration. -Continue monitoring  Essential hypertension: Normotensive.  On amlodipine at home. -Continue home amlodipine  Hypothyroidism -Continue home Synthroid  Leukocytosis: Partly from dehydration  Severe malnutrition/unintentional weight loss in the setting of poor p.o. intake: Body mass index is 19.09 kg/m.  Significant muscle mass and subcu fat loss. -Consult dietitian  Debility/generalized weakness -PT/OT eval                  DVT prophylaxis: Subcu Lovenox Code Status: Full code Family Communication: Patient and/or RN. Available if any question.  Discharge barrier: Hyponatremia and AKI Patient is from: Home Final disposition: Likely home  Consultants:  Gastroenterology   Sch Meds:  Scheduled Meds: . amLODipine  10 mg Oral Daily  . aspirin EC  81 mg Oral Daily  . enoxaparin (LOVENOX) injection  30 mg Subcutaneous Q24H  . fluticasone  1 spray Each Nare Daily  . levothyroxine  137 mcg Oral QAC breakfast  . loratadine  10 mg Oral Daily  . multivitamin with minerals  1 tablet Oral Daily  . pantoprazole  40 mg Oral Daily   Continuous Infusions: . sodium chloride 75 mL/hr at 03/20/20 0100  . meropenem (MERREM) IV Stopped (03/20/20 0551)   PRN Meds:.acetaminophen **OR** acetaminophen, HYDROcodone-acetaminophen, ondansetron **OR** ondansetron (ZOFRAN) IV, polyethylene glycol, traZODone  Antimicrobials: Anti-infectives (From admission, onward)   Start     Dose/Rate Route Frequency Ordered Stop   03/20/20 0130  meropenem (MERREM) 1 g in sodium chloride 0.9 % 100 mL IVPB     1 g 200 mL/hr over 30 Minutes Intravenous  Every 12 hours 03/20/20 0109         I have personally reviewed the following labs and images: CBC: Recent Labs  Lab 03/19/20 1940 03/20/20 0525  WBC 14.2* 13.3*  NEUTROABS 10.4* 9.8*  HGB 15.5 15.1  HCT 46.6 46.3  MCV 70.4* 70.4*  PLT 578* 545*   BMP &GFR Recent Labs  Lab 03/19/20 1940 03/20/20 0525 03/20/20 0839  NA 126* 126*  128* 128*  K 5.1 4.7  4.9 4.1  CL 95* 95*  95* 95*  CO2 18* 18*  20* 25  GLUCOSE 116* 104*  107* 134*  BUN 57* 56*  58* 52*  CREATININE 2.09* 1.76*  1.67* 1.66*  CALCIUM 10.3 10.0  10.3 10.0  PHOS  --  5.1* 4.1   Estimated Creatinine Clearance: 31.5 mL/min (A) (by C-G formula based on SCr of 1.66 mg/dL (H)). Liver & Pancreas: Recent Labs  Lab 03/19/20 1940 03/20/20 0525 03/20/20 0839  AST 26 22  --   ALT 38 36  --   ALKPHOS 221* 203*  --   BILITOT 0.8 1.0  --   PROT 9.3* 8.9*  --   ALBUMIN 4.1 4.2  4.1 3.9   No results for input(s): LIPASE, AMYLASE in the last 168 hours. No results for input(s): AMMONIA in the  last 168 hours. Diabetic: No results for input(s): HGBA1C in the last 72 hours. No results for input(s): GLUCAP in the last 168 hours. Cardiac Enzymes: No results for input(s): CKTOTAL, CKMB, CKMBINDEX, TROPONINI in the last 168 hours. No results for input(s): PROBNP in the last 8760 hours. Coagulation Profile: No results for input(s): INR, PROTIME in the last 168 hours. Thyroid Function Tests: No results for input(s): TSH, T4TOTAL, FREET4, T3FREE, THYROIDAB in the last 72 hours. Lipid Profile: No results for input(s): CHOL, HDL, LDLCALC, TRIG, CHOLHDL, LDLDIRECT in the last 72 hours. Anemia Panel: No results for input(s): VITAMINB12, FOLATE, FERRITIN, TIBC, IRON, RETICCTPCT in the last 72 hours. Urine analysis:    Component Value Date/Time   COLORURINE YELLOW 03/19/2020 2332   APPEARANCEUR HAZY (A) 03/19/2020 2332   LABSPEC 1.017 03/19/2020 2332   PHURINE 5.0 03/19/2020 2332   GLUCOSEU NEGATIVE 03/19/2020  2332   HGBUR NEGATIVE 03/19/2020 2332   BILIRUBINUR NEGATIVE 03/19/2020 2332   KETONESUR NEGATIVE 03/19/2020 2332   PROTEINUR NEGATIVE 03/19/2020 2332   UROBILINOGEN 1.0 09/04/2015 2309   NITRITE NEGATIVE 03/19/2020 2332   LEUKOCYTESUR NEGATIVE 03/19/2020 2332   Sepsis Labs: Invalid input(s): PROCALCITONIN, [place]  Microbiology: Recent Results (from the past 240 hour(s))  SARS CORONAVIRUS 2 (TAT 6-24 HRS) Nasopharyngeal Nasopharyngeal Swab     Status: None   Collection Time: 03/20/20 12:50 AM   Specimen: Nasopharyngeal Swab  Result Value Ref Range Status   SARS Coronavirus 2 NEGATIVE NEGATIVE Final    Comment: (NOTE) SARS-CoV-2 target nucleic acids are NOT DETECTED. The SARS-CoV-2 RNA is generally detectable in upper and lower respiratory specimens during the acute phase of infection. Negative results do not preclude SARS-CoV-2 infection, do not rule out co-infections with other pathogens, and should not be used as the sole basis for treatment or other patient management decisions. Negative results must be combined with clinical observations, patient history, and epidemiological information. The expected result is Negative. Fact Sheet for Patients: SugarRoll.be Fact Sheet for Healthcare Providers: https://www.woods-mathews.com/ This test is not yet approved or cleared by the [place] FDA and  has been authorized for detection and/or diagnosis of SARS-CoV-2 by FDA under an Emergency Use Authorization (EUA). This EUA will remain  in effect (meaning this test can be used) for the duration of the COVID-19 declaration under Section 56 4(b)(1) of the Act, 21 U.S.C. section 360bbb-3(b)(1), unless the authorization is terminated or revoked sooner. Performed at [facility], [place] [address], [place], [place] [address]   Culture, blood (routine x 2)     Status: None (Preliminary result)   Collection Time: 03/20/20  5:25 AM    Specimen: BLOOD RIGHT HAND  Result Value Ref Range Status   Specimen Description BLOOD RIGHT HAND  Final   Special Requests   Final    BOTTLES DRAWN AEROBIC AND ANAEROBIC Blood Culture adequate volume   Culture   Final    NO GROWTH <12 HOURS Performed at [facility], [place] [address], [place], [place] [address]    Report Status PENDING  Incomplete  Culture, blood (routine x 2)     Status: None (Preliminary result)   Collection Time: 03/20/20  5:25 AM   Specimen: BLOOD  Result Value Ref Range Status   Specimen Description BLOOD RIGHT ANTECUBITAL  Final   Special Requests   Final    BOTTLES DRAWN AEROBIC AND ANAEROBIC Blood Culture adequate volume   Culture   Final    NO GROWTH <12 HOURS Performed  at [facility], [place] [address] [place], [place] [address]    Report Status PENDING  Incomplete    Radiology Studies: MR ABDOMEN MRCP W WO CONTAST  Result Date: 03/20/2020 CLINICAL DATA:  Cholecystitis with proximal common bile duct obstruction. Concern for malignancy. Prior negative brush biopsies of common bile duct. EXAM: MRI ABDOMEN WITHOUT AND WITH CONTRAST (INCLUDING MRCP) TECHNIQUE: Multiplanar multisequence MR imaging of the abdomen was performed both before and after the administration of intravenous contrast. Heavily T2-weighted images of the biliary and pancreatic ducts were obtained, and three-dimensional MRCP images were rendered by post processing. CONTRAST:  51mL GADAVIST GADOBUTROL 1 MMOL/ML IV SOLN COMPARISON:  03/05/2020 CT.  02/03/2020 MRCP. FINDINGS: Portions of exam are mildly motion degraded. Lower chest: Normal heart size without pericardial or pleural effusion. Hepatobiliary: A subcentimeter right hepatic lobe lesion is too small to characterize. Again identified is and external to internal biliary stent, placed from the left hepatic lobe and terminated in the descending duodenum. There is mild concurrent intrahepatic biliary duct dilatation, most apparent in the  right hepatic lobe. Gallbladder wall thickening is decreased, including at 4 mm on 19/9. Amorphous soft tissue is identified within the porta hepatis, extending from just medial to the gallbladder neck into the pancreatic head region. Example images 16-18 of series 9 and 18/18. This demonstrates delayed post-contrast enhancement, measuring on the order of 5.2 x 3.5 cm on 43/29. Surrounds the portal vein, which remains patent. Pancreas: The pancreatic body and tail are normal, without duct dilatation. The pancreatic head is likely involved by the porta hepatis process, including on 19/4. No superimposed pancreatitis. Spleen:  Normal in size, without focal abnormality. Adrenals/Urinary Tract: Normal adrenal glands. Bilateral renal cysts. A multiseptated lower pole right renal complex cyst measures 3.7 x 2.5 cm and has been present back to 12/18/2013. No hydronephrosis. Stomach/Bowel: Proximal gastric underdistention. No evidence of duodenal obstruction. Colonic stool burden suggests constipation. Vascular/Lymphatic: Advanced aortic atherosclerosis. No separate areas of abdominal adenopathy. Other:  No ascites. Musculoskeletal: No acute osseous abnormality. IMPRESSION: 1. New or progressive soft tissue mass centered in the porta hepatis and likely involving the pancreatic head. Favor cholangiocarcinoma. Given history of negative brushings, marked cholangitis is possible but felt much less likely. Consider repeat tissue sampling. This may be amenable to endoscopic ultrasound visualization and sampling. 2. Left-sided external to internal biliary stent in place with mild intrahepatic biliary duct dilatation. 3.  Aortic Atherosclerosis (ICD10-I70.0). 4.  Possible constipation. 5. Decrease in minimal gallbladder wall thickening. Electronically Signed   By: Abigail Miyamoto M.D.   On: 03/20/2020 05:35   35 minutes with more than 50% spent in reviewing records, counseling patient/family and coordinating care.   [place] Dirosa T. McCoy  If 7PM-7AM, please contact night-coverage [email or web address] Password [facility] 03/20/2020, 11:33 AM

## 2020-03-20 NOTE — Consult Note (Signed)
Referring Provider: Dr. Cyndia Skeeters Primary Care Physician:  [facility] Primary Gastroenterologist:  Dr. Watt Climes  Reason for Consultation:  Bile duct obstruction; Acut Cholecystitis  HPI: Rodney Romero. is a 74 y.o. male with recent cholecystitis and proximal common bile duct obstruction and had an ERCP on 02/05/20 with unsuccessful cannulation of the CBD despite needle knife biliary sphincterotomy and subsequently had PTC with drain placement on 02/06/20. Biliary brushings from the [facility] were negative for malignancy. He was scheduled to see surgery at [facility] as an outpt next week. He was readmitted with hyponatremia (Na 126 on admit). Unable to get any history from the patient. LFTs normal on admit with TB 0.8, AST 26, ALT 38. ALP elevated at 221. On 03/05/20 (prior to biliary drain) his AST 216, ALT 216. WBC increased to 14.2. MRCP on this admit shows soft tissue mass in the porta hepatitis involving the pancreatic head.  Past Medical History:  Diagnosis Date  . Bipolar 1 disorder ([place])   . Hypertension   . Hypothyroidism   . PTSD (post-traumatic stress disorder)     Past Surgical History:  Procedure Laterality Date  . ERCP N/A 02/05/2020   Procedure: ENDOSCOPIC RETROGRADE CHOLANGIOPANCREATOGRAPHY (ERCP);  Surgeon: Clarene Essex, MD;  Location: [place];  Service: Endoscopy;  Laterality: N/A;  . GALLBLADDER SURGERY    . IR ENDOLUMINAL BX OF BILIARY TREE  02/06/2020  . IR EXCHANGE BILIARY DRAIN  03/10/2020  . IR INT EXT BILIARY DRAIN WITH CHOLANGIOGRAM  02/06/2020  . SPHINCTEROTOMY  02/05/2020   Procedure: SPHINCTEROTOMY;  Surgeon: Clarene Essex, MD;  Location: [facility] ENDOSCOPY;  Service: Endoscopy;;  . TIBIA IM NAIL INSERTION Left 06/26/2018   Procedure: left tibia intramedullary nail;  Surgeon: Gaynelle Arabian, MD;  Location: [facility];  Service: Orthopedics;  Laterality: Left;    Prior to Admission medications   Medication Sig Start Date End Date Taking? Authorizing Provider  amLODipine (NORVASC) 10 MG  tablet Take 10 mg by mouth daily.    [provider]  amoxicillin-clavulanate (AUGMENTIN) 875-125 MG tablet Take 1 tablet by mouth every 12 (twelve) hours. 03/05/20   Valarie Merino, MD  aspirin EC 81 MG tablet Take 81 mg by mouth daily.    [provider]  fluticasone (FLONASE) 50 MCG/ACT nasal spray Place 1 spray into both nostrils daily.    [provider]  galantamine (RAZADYNE ER) 8 MG 24 hr capsule Take 8 mg by mouth daily with breakfast.    [provider]  HYDROcodone-acetaminophen (NORCO/VICODIN) 5-325 MG tablet Take 1-2 tablets by mouth every 6 (six) hours as needed for moderate pain or severe pain. 03/10/20   Monia Sabal, PA-C  levothyroxine (SYNTHROID, LEVOTHROID) 137 MCG tablet Take 137 mcg by mouth daily before breakfast.    [provider]  loratadine (CLARITIN) 10 MG tablet Take 10 mg by mouth daily.    [provider]  mirtazapine (REMERON) 15 MG tablet Take 15 mg by mouth at bedtime as needed (sleep).     [provider]  Multiple Vitamins-Minerals (MULTIVITAMIN WITH MINERALS) tablet Take 1 tablet by mouth daily.    [provider]  omeprazole (PRILOSEC) 20 MG capsule Take 20 mg by mouth daily.    [provider]  traZODone (DESYREL) 50 MG tablet Take 50 mg by mouth at bedtime as needed for sleep.    [provider]    Scheduled Meds: . amLODipine  10 mg Oral Daily  . aspirin EC  81 mg Oral  Daily  . enoxaparin (LOVENOX) injection  30 mg Subcutaneous Q24H  . fluticasone  1 spray Each Nare Daily  . levothyroxine  137 mcg Oral QAC breakfast  . loratadine  10 mg Oral Daily  . multivitamin with minerals  1 tablet Oral Daily  . pantoprazole  40 mg Oral Daily   Continuous Infusions: . sodium chloride 75 mL/hr at 03/20/20 0100  . meropenem (MERREM) IV Stopped (03/20/20 0551)   PRN Meds:.acetaminophen **OR** acetaminophen, HYDROcodone-acetaminophen, ondansetron **OR** ondansetron (ZOFRAN)  IV, polyethylene glycol, traZODone  Allergies as of 03/19/2020  . (No Known Allergies)    Family History  Problem Relation Age of Onset  . Hypertension Mother   . Diabetes Mother   . Seizures Father     Social History   Socioeconomic History  . Marital status: Married    Spouse name: Not on file  . Number of children: Not on file  . Years of education: Not on file  . Highest education level: Not on file  Occupational History  . Not on file  Tobacco Use  . Smoking status: Current Every Day Smoker    Packs/day: 0.50    Types: Cigarettes    Last attempt to quit: 09/03/2014    Years since quitting: 5.5  . Smokeless tobacco: Never Used  Substance and Sexual Activity  . Alcohol use: Yes    Comment: Everyother day   . Drug use: No  . Sexual activity: Not Currently    Birth control/protection: None  Other Topics Concern  . Not on file  Social History Narrative  . Not on file   Social Determinants of Health   Financial Resource Strain:   . Difficulty of Paying Living Expenses:   Food Insecurity:   . Worried About Charity fundraiser in the Last Year:   . Arboriculturist in the Last Year:   Transportation Needs:   . Film/video editor (Medical):   Marland Kitchen Lack of Transportation (Non-Medical):   Physical Activity:   . Days of Exercise per Week:   . Minutes of Exercise per Session:   Stress:   . Feeling of Stress :   Social Connections:   . Frequency of Communication with Friends and Family:   . Frequency of Social Gatherings with Friends and Family:   . Attends Religious Services:   . Active Member of Clubs or Organizations:   . Attends Archivist Meetings:   Marland Kitchen Marital Status:   Intimate Partner Violence:   . Fear of Current or Ex-Partner:   . Emotionally Abused:   Marland Kitchen Physically Abused:   . Sexually Abused:     Review of Systems: All negative except as stated above in HPI.  Physical Exam: Vital signs: Vitals:   03/20/20 0725 03/20/20 0842  BP:  113/83 131/86  Pulse: 70 68  Resp:  16  Temp:  98.1 F (36.7 C)  SpO2: 100% 99%     General: Cachetic, elderly, lethargic, no acute distress, disheveled  Head: normocephalic, atraumatic Eyes: anicteric sclera ENT: oropharynx clear, poor dentition Neck: supple, nontender Lungs:  Clear throughout to auscultation.   No wheezes, crackles, or rhonchi. No acute distress. Heart:  Regular rate and rhythm; no murmurs, clicks, rubs,  or gallops. Abdomen: soft, nontender, nondistended, +BS  Rectal:  Deferred Ext: no edema  GI:  Lab Results: Recent Labs    03/19/20 1940 03/20/20 0525  WBC 14.2* 13.3*  HGB 15.5 15.1  HCT 46.6 46.3  PLT 578* 545*  BMET Recent Labs    03/19/20 1940 03/20/20 0525 03/20/20 0839  NA 126* 126*  128* 128*  K 5.1 4.7  4.9 4.1  CL 95* 95*  95* 95*  CO2 18* 18*  20* 25  GLUCOSE 116* 104*  107* 134*  BUN 57* 56*  58* 52*  CREATININE 2.09* 1.76*  1.67* 1.66*  CALCIUM 10.3 10.0  10.3 10.0   LFT Recent Labs    03/20/20 0525 03/20/20 0525 03/20/20 0839  PROT 8.9*  --   --   ALBUMIN 4.2  4.1   < > 3.9  AST 22  --   --   ALT 36  --   --   ALKPHOS 203*  --   --   BILITOT 1.0  --   --    < > = values in this interval not displayed.   PT/INR No results for input(s): LABPROT, INR in the last 72 hours.   Studies/Results: MR ABDOMEN MRCP W WO CONTAST  Result Date: 03/20/2020 CLINICAL DATA:  Cholecystitis with proximal common bile duct obstruction. Concern for malignancy. Prior negative brush biopsies of common bile duct. EXAM: MRI ABDOMEN WITHOUT AND WITH CONTRAST (INCLUDING MRCP) TECHNIQUE: Multiplanar multisequence MR imaging of the abdomen was performed both before and after the administration of intravenous contrast. Heavily T2-weighted images of the biliary and pancreatic ducts were obtained, and three-dimensional MRCP images were rendered by post processing. CONTRAST:  62m GADAVIST GADOBUTROL 1 MMOL/ML IV SOLN COMPARISON:  03/05/2020 CT.   02/03/2020 MRCP. FINDINGS: Portions of exam are mildly motion degraded. Lower chest: Normal heart size without pericardial or pleural effusion. Hepatobiliary: A subcentimeter right hepatic lobe lesion is too small to characterize. Again identified is and external to internal biliary stent, placed from the left hepatic lobe and terminated in the descending duodenum. There is mild concurrent intrahepatic biliary duct dilatation, most apparent in the right hepatic lobe. Gallbladder wall thickening is decreased, including at 4 mm on 19/9. Amorphous soft tissue is identified within the porta hepatis, extending from just medial to the gallbladder neck into the pancreatic head region. Example images 16-18 of series 9 and 18/18. This demonstrates delayed post-contrast enhancement, measuring on the order of 5.2 x 3.5 cm on 43/29. Surrounds the portal vein, which remains patent. Pancreas: The pancreatic body and tail are normal, without duct dilatation. The pancreatic head is likely involved by the porta hepatis process, including on 19/4. No superimposed pancreatitis. Spleen:  Normal in size, without focal abnormality. Adrenals/Urinary Tract: Normal adrenal glands. Bilateral renal cysts. A multiseptated lower pole right renal complex cyst measures 3.7 x 2.5 cm and has been present back to 12/18/2013. No hydronephrosis. Stomach/Bowel: Proximal gastric underdistention. No evidence of duodenal obstruction. Colonic stool burden suggests constipation. Vascular/Lymphatic: Advanced aortic atherosclerosis. No separate areas of abdominal adenopathy. Other:  No ascites. Musculoskeletal: No acute osseous abnormality. IMPRESSION: 1. New or progressive soft tissue mass centered in the porta hepatis and likely involving the pancreatic head. Favor cholangiocarcinoma. Given history of negative brushings, marked cholangitis is possible but felt much less likely. Consider repeat tissue sampling. This may be amenable to endoscopic ultrasound  visualization and sampling. 2. Left-sided external to internal biliary stent in place with mild intrahepatic biliary duct dilatation. 3.  Aortic Atherosclerosis (ICD10-I70.0). 4.  Possible constipation. 5. Decrease in minimal gallbladder wall thickening. Electronically Signed   By: KAbigail MiyamotoM.D.   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 Biliary brushings on PTC were negative for malignancy. Transaminases normal and no signs of cholangitis to warrant repeat ERCP. Would recommend an outpt endoscopic ultrasound unless tissue diagnosis can be obtained at a tertiary care facility with repeat ERCP or gallbladder surgery ([facility] surgery appt this week). If sodium normalizes hopefully he could have that appt rescheduled and be seen very soon. Will discuss with my partner, Dr. Paulita Fujita who does EUS as well. Will re-visit on Monday after discussing with Dr. Paulita Fujita.    LOS: 0 days   Lear Ng  03/20/2020, 12:17 PM  Questions please call [phone]

## 2020-03-20 NOTE — Progress Notes (Signed)
Patient arrived to unit alert and oriented x4. Patient assessed, left in bed with alarm on, bed in lowest position, and call bell within reach. Orders Checked. Will continue to monitor. Gwendolyn Grant, RN

## 2020-03-20 NOTE — H&P (Addendum)
History and Physical    Rodney Romero. [id number] DOB: 28-Jun-1946 DOA: 03/19/2020  PCP: [place]  Patient coming from: Home   Chief Complaint:  Chief Complaint  Patient presents with  . Abnormal Lab     HPI:    74 year old male with past medical history of hypertension, hypothyroidism, gastroesophageal reflux disease and recent hospitalization at [facility] from March 2 to March 9 during which the patient was felt to have a complicated cholecystitis with proximal common bile duct obstruction post placement of a drain.  Both Dr. Watt Climes wtih gastroenterology and surgery were consulted during that hospitalization.  Brush biopsies of the proximal bile duct obstruction revealed no evidence of malignancy at the time.  Patient is an extremely poor historian and some additional history has been obtained from the wife.  Since that hospitalization, patient has experienced progressive weight loss and night sweats.  Patient states that the abdominal pain he experienced since the hospitalization has since resolved.  Additional history from the wife reveals that the patient has additionally become progressively more weak in the past several days.  Patient is also been exhibiting extremely poor appetite and oral intake.  On further questioning patient denies fevers, denies sick contacts, denies dysuria, denies low back pain.  Patient does complain of near constant nausea but denies any associated vomiting.  Since recent hospitalization patient has followed up as an outpatient with his provider at the [place].  Patient did return to [facility] emergency department on 4/2 due to continued complaints of abdominal pain and was evaluated by surgery.  At that time surgery deemed the patient was stable to be discharged home with outpatient follow-up with gastroenterology.  Patient is additionally to follow-up with [name] with [facility] health surgery on 4/19.  During patient's  most recent appointment with the [facility] on 4/17, a serial chemistry revealed the patient is developing substantial hyponatremia with associated acute kidney injury.  The patient was then instructed to go to [facility] emergency department for assessment.  Upon evaluation in the emergency department, hyponatremia and acute kidney injury were confirmed with chemistry and afterwards the hospitalist group was called to assess the patient for admission the hospital.   Review of Systems: A 10-system review of systems has been performed and all systems are negative with the exception of what is listed in the HPI.   Past Medical History:  Diagnosis Date  . Bipolar 1 disorder ([place])   . Hypertension   . Hypothyroidism   . PTSD (post-traumatic stress disorder)     Past Surgical History:  Procedure Laterality Date  . ERCP N/A 02/05/2020   Procedure: ENDOSCOPIC RETROGRADE CHOLANGIOPANCREATOGRAPHY (ERCP);  Surgeon: Clarene Essex, MD;  Location: [place];  Service: Endoscopy;  Laterality: N/A;  . GALLBLADDER SURGERY    . IR ENDOLUMINAL BX OF BILIARY TREE  02/06/2020  . IR EXCHANGE BILIARY DRAIN  03/10/2020  . IR INT EXT BILIARY DRAIN WITH CHOLANGIOGRAM  02/06/2020  . SPHINCTEROTOMY  02/05/2020   Procedure: SPHINCTEROTOMY;  Surgeon: Clarene Essex, MD;  Location: [facility] ENDOSCOPY;  Service: Endoscopy;;  . TIBIA IM NAIL INSERTION Left 06/26/2018   Procedure: left tibia intramedullary nail;  Surgeon: Gaynelle Arabian, MD;  Location: [facility];  Service: Orthopedics;  Laterality: Left;     reports that he has been smoking cigarettes. He has been smoking about 0.50 packs per day. He has never used smokeless tobacco. He reports current alcohol use. He reports that he does not use  drugs.  No Known Allergies  Family History  Problem Relation Age of Onset  . Hypertension Mother   . Diabetes Mother   . Seizures Father      Prior to Admission medications   Medication Sig Start Date End Date Taking? Authorizing Provider    amLODipine (NORVASC) 10 MG tablet Take 10 mg by mouth daily.    [provider]  amoxicillin-clavulanate (AUGMENTIN) 875-125 MG tablet Take 1 tablet by mouth every 12 (twelve) hours. 03/05/20   Valarie Merino, MD  aspirin EC 81 MG tablet Take 81 mg by mouth daily.    [provider]  fluticasone (FLONASE) 50 MCG/ACT nasal spray Place 1 spray into both nostrils daily.    [provider]  galantamine (RAZADYNE ER) 8 MG 24 hr capsule Take 8 mg by mouth daily with breakfast.    [provider]  HYDROcodone-acetaminophen (NORCO/VICODIN) 5-325 MG tablet Take 1-2 tablets by mouth every 6 (six) hours as needed for moderate pain or severe pain. 03/10/20   Monia Sabal, PA-C  levothyroxine (SYNTHROID, LEVOTHROID) 137 MCG tablet Take 137 mcg by mouth daily before breakfast.    [provider]  loratadine (CLARITIN) 10 MG tablet Take 10 mg by mouth daily.    [provider]  mirtazapine (REMERON) 15 MG tablet Take 15 mg by mouth at bedtime as needed (sleep).     [provider]  Multiple Vitamins-Minerals (MULTIVITAMIN WITH MINERALS) tablet Take 1 tablet by mouth daily.    [provider]  omeprazole (PRILOSEC) 20 MG capsule Take 20 mg by mouth daily.    [provider]  traZODone (DESYREL) 50 MG tablet Take 50 mg by mouth at bedtime as needed for sleep.    [provider]    Physical Exam: Vitals:   03/19/20 1936 03/19/20 2300 03/19/20 2315 03/20/20 0000  BP:  129/89 (!) 141/96 (!) 128/92  Pulse:      Resp:  19 13 13   Temp:      TempSrc:      SpO2:      Weight: 55.3 kg     Height: 5\' 7"  (1.702 m)       Constitutional: Lethargic but arousable and oriented x3, no associated distress.  Patient is cachectic. Skin: no rashes, no lesions, poor skin turgor noted. Eyes: Pupils are equally reactive to light.  No evidence of scleral icterus or conjunctival pallor.  ENMT: Dry mucous membranes noted with poor  dentition.Marland Kitchen Posterior pharynx clear of any exudate or lesions. Neck: normal, supple, no masses, no thyromegaly Respiratory: clear to auscultation bilaterally, no wheezing, no crackles. Normal respiratory effort. No accessory muscle use.  Cardiovascular: Regular rate and rhythm, no murmurs / rubs / gallops. No extremity edema. 2+ pedal pulses. No carotid bruits.  Back:   Nontender without crepitus or deformity. Abdomen: Biliary drain is in place, draining murky and almost purulent bilious fluid.  Abdomen is soft and nontender.  No evidence of intra-abdominal masses.  Positive bowel sounds noted in all quadrants.   Musculoskeletal: No joint deformity upper and lower extremities. Good ROM, no contractures.  Poor muscle tone noted. Neurologic: CN 2-12 grossly intact. Sensation intact, strength noted to be 5 out of 5 in all 4 extremities.  Patient is following all commands.  Patient is responsive to verbal stimuli.   Psychiatric: Patient presents with depressed mood and flat affect.  Patient seems to possess insight as to theircurrent situation.     Labs on Admission: I have personally  reviewed following labs and imaging studies -   CBC: Recent Labs  Lab 03/19/20 1940  WBC 14.2*  NEUTROABS 10.4*  HGB 15.5  HCT 46.6  MCV 70.4*  PLT [id number]*   Basic Metabolic Panel: Recent Labs  Lab 03/19/20 1940  NA 126*  K 5.1  CL 95*  CO2 18*  GLUCOSE 116*  BUN 57*  CREATININE 2.09*  CALCIUM 10.3   GFR: Estimated Creatinine Clearance: 25 mL/min (A) (by C-G formula based on SCr of 2.09 mg/dL (H)). Liver Function Tests: Recent Labs  Lab 03/19/20 1940  AST 26  ALT 38  ALKPHOS 221*  BILITOT 0.8  PROT 9.3*  ALBUMIN 4.1   No results for input(s): LIPASE, AMYLASE in the last 168 hours. No results for input(s): AMMONIA in the last 168 hours. Coagulation Profile: No results for input(s): INR, PROTIME in the last 168 hours. Cardiac Enzymes: No results for input(s): CKTOTAL, CKMB, CKMBINDEX,  TROPONINI in the last 168 hours. BNP (last 3 results) No results for input(s): PROBNP in the last 8760 hours. HbA1C: No results for input(s): HGBA1C in the last 72 hours. CBG: No results for input(s): GLUCAP in the last 168 hours. Lipid Profile: No results for input(s): CHOL, HDL, LDLCALC, TRIG, CHOLHDL, LDLDIRECT in the last 72 hours. Thyroid Function Tests: No results for input(s): TSH, T4TOTAL, FREET4, T3FREE, THYROIDAB in the last 72 hours. Anemia Panel: No results for input(s): VITAMINB12, FOLATE, FERRITIN, TIBC, IRON, RETICCTPCT in the last 72 hours. Urine analysis:    Component Value Date/Time   COLORURINE YELLOW 03/19/2020 2332   APPEARANCEUR HAZY (A) 03/19/2020 2332   LABSPEC 1.017 03/19/2020 2332   PHURINE 5.0 03/19/2020 2332   GLUCOSEU NEGATIVE 03/19/2020 2332   HGBUR NEGATIVE 03/19/2020 2332   BILIRUBINUR NEGATIVE 03/19/2020 2332   KETONESUR NEGATIVE 03/19/2020 2332   PROTEINUR NEGATIVE 03/19/2020 2332   UROBILINOGEN 1.0 09/04/2015 2309   NITRITE NEGATIVE 03/19/2020 2332   LEUKOCYTESUR NEGATIVE 03/19/2020 2332    Radiological Exams on Admission: No results found.   Assessment/Plan Active Problems:   Cholecystitis, acute   Patient presenting with extremely complicated acute cholecystitis with complicating proximal common bile duct obstruction.  Malignancy may concurrently be present but this is not definitively proven considering recent brush biopsies are negative (despite nonspecific elevation of CA19-9 and CEA).  Patient was to undergo outpatient follow-up with [name] with [facility] surgical oncology on 4/19 but unfortunately continues to present to our hospital with complications   Patient is now exhibiting worsening leukocytosis, worsening thrombocytosis, rising alkaline phosphatase, continued weight loss, very poor oral intake and now is developing substantial hyponatremia with acute kidney injury  Will obtain repeat MRCP  Will obtain CRP,  lactate  Will obtain blood cultures as well as cultures of biliary drain output  Obtaining cytology from biliary drain, repeat CA 19-9 and CEA  Placing patient on intravenous meropenem due to rising leukocytosis  Will again consult surgery in the morning to get their input - hopefully if patient's hyponatremia and acute kidney injury can be corrected then arrangements can be made for the patient to continue outpatient follow-up and management    Common bile duct (CBD) obstruction   See assessment and plan above.    Dehydration with hyponatremia   Patient exhibiting substantial hyponatremia of 126 with associated generalized weakness  Etiology is likely secondary to extremely poor oral intake  We will start by hydrating patient gently with normal saline at 75 cc an hour  We will perform serial chemistries  every 4 hours to ensure slow measured correction of sodium and minimize risk of central pontine myelinolysis    AKI (acute kidney injury) ([place])   Patient exhibiting evidence of acute kidney injury  Etiology likely prerenal due to extremely poor oral intake  Hydrating patient with intravenous isotonic fluids  Monitor renal function with serial chemistries    Essential hypertension   Restarting home regimen of amlodipine while monitoring for any evidence of episodic hypotension considering hypovolemia    Hypothyroidism   Continue home regimen of Synthroid     GERD without esophagitis   Continuing home regimen of PPI      Code Status:  Full code Family Communication: Discussed case at length with patient's wife.  Status is: Inpatient  Remains inpatient appropriate because:Persistent severe electrolyte disturbances, Ongoing diagnostic testing needed not appropriate for outpatient work up and IV treatments appropriate due to intensity of illness or inability to take PO   Dispo: The patient is from: Home              Anticipated d/c is to: Home               Anticipated d/c date is: 2 days              Patient currently is not medically stable to d/c.         Vernelle Emerald MD [facility] Pager [phone]  If 7PM-7AM, please contact night-coverage [email or web address] Use universal [place] password for that web site. If you do not have the password, please call the hospital operator.  03/20/2020, 1:26 AM

## 2020-03-21 LAB — CEA: CEA: 17 ng/mL — ABNORMAL HIGH (ref 0.0–4.7)

## 2020-03-21 LAB — CBC WITH DIFFERENTIAL/PLATELET
Abs Immature Granulocytes: 0.08 10*3/uL — ABNORMAL HIGH (ref 0.00–0.07)
Basophils Absolute: 0.1 10*3/uL (ref 0.0–0.1)
Basophils Relative: 1 %
Eosinophils Absolute: 0.2 10*3/uL (ref 0.0–0.5)
Eosinophils Relative: 2 %
HCT: 40.1 % (ref 39.0–52.0)
Hemoglobin: 13.3 g/dL (ref 13.0–17.0)
Immature Granulocytes: 1 %
Lymphocytes Relative: 19 %
Lymphs Abs: 2.1 10*3/uL (ref 0.7–4.0)
MCH: 23 pg — ABNORMAL LOW (ref 26.0–34.0)
MCHC: 33.2 g/dL (ref 30.0–36.0)
MCV: 69.3 fL — ABNORMAL LOW (ref 80.0–100.0)
Monocytes Absolute: 0.6 10*3/uL (ref 0.1–1.0)
Monocytes Relative: 6 %
Neutro Abs: 7.6 10*3/uL (ref 1.7–7.7)
Neutrophils Relative %: 71 %
Platelets: 447 10*3/uL — ABNORMAL HIGH (ref 150–400)
RBC: 5.79 MIL/uL (ref 4.22–5.81)
RDW: 13.8 % (ref 11.5–15.5)
WBC: 10.6 10*3/uL — ABNORMAL HIGH (ref 4.0–10.5)
nRBC: 0 % (ref 0.0–0.2)

## 2020-03-21 LAB — MAGNESIUM: Magnesium: 1.9 mg/dL (ref 1.7–2.4)

## 2020-03-21 LAB — COMPREHENSIVE METABOLIC PANEL
ALT: 26 U/L (ref 0–44)
AST: 20 U/L (ref 15–41)
Albumin: 3.2 g/dL — ABNORMAL LOW (ref 3.5–5.0)
Alkaline Phosphatase: 150 U/L — ABNORMAL HIGH (ref 38–126)
Anion gap: 6 (ref 5–15)
BUN: 20 mg/dL (ref 8–23)
CO2: 24 mmol/L (ref 22–32)
Calcium: 9 mg/dL (ref 8.9–10.3)
Chloride: 101 mmol/L (ref 98–111)
Creatinine, Ser: 0.9 mg/dL (ref 0.61–1.24)
GFR calc Af Amer: >60 mL/min (ref >60)
GFR calc non Af Amer: >60 mL/min (ref >60)
Glucose, Bld: 98 mg/dL (ref 70–99)
Potassium: 4.4 mmol/L (ref 3.5–5.1)
Sodium: 131 mmol/L — ABNORMAL LOW (ref 135–145)
Total Bilirubin: 1 mg/dL (ref 0.3–1.2)
Total Protein: 7.3 g/dL (ref 6.5–8.1)

## 2020-03-21 LAB — TSH: TSH: 12.674 u[IU]/mL — ABNORMAL HIGH (ref 0.350–4.500)

## 2020-03-21 LAB — CORTISOL-AM, BLOOD: Cortisol - AM: 18.5 ug/dL (ref 6.7–22.6)

## 2020-03-21 LAB — CANCER ANTIGEN 19-9: CA 19-9: 20 U/mL (ref 0–35)

## 2020-03-21 MED ORDER — SODIUM CHLORIDE 0.9 % IV SOLN
1.0000 g | Freq: Three times a day (TID) | INTRAVENOUS | Status: DC
  Administered 2020-03-21 – 2020-03-22 (×2): 1 g via INTRAVENOUS
  Filled 2020-03-21 (×4): qty 1

## 2020-03-21 NOTE — Progress Notes (Signed)
PROGRESS NOTE  Rodney Romero. Rodney Romero DOB: [birth year]   PCP: [facility]  Patient is from: Home  DOA: 03/19/2020 LOS: 1  Brief Narrative / Interim history: 74 year old male with history of cholecystitis/common bile duct obstruction s/p percutaneous drain, HTN, hypothyroidism and GERD presenting with generalized weakness, unintentional weight loss, poor p.o. intake, nausea, hyponatremia and AKI.  Apparently, he presented to PCP and had a blood work.  Was told to have hyponatremia and AKI and directed to ED by PCP.  Of note, patient was hospitalized here from 3/2-3/9 for acute cholecystitis/CBD obstruction and underwent percutaneous drain and biliary stent placement.  Brush biopsy at that time negative for malignancy.  He was seen in [facility] ED on 4/2 for abdominal pain and cleared for discharge by general surgery home to follow-up with GI outpatient.  Patient was to follow-up with [name] at [facility] surgery on 4/19 but presented to PCP on 4/17 and found to have hyponatremia and AKI.  In ED, hemodynamically stable. Na 126.  Creatinine 2.09 (baseline 1.0).  BUN 57.  ALP 221.  Total protein 9.3.  Albumin 4.1.  WBC 14.2.  Platelet 578.  UA without significant finding.  COVID-19 PCR negative.  He was started on meropenem for presumed acute cholecystitis and admitted.  MRCP ordered.  MRCP concerning for new or progressive soft tissue mass centered in the porta hepatis and likely involving the pancreatic head concerning for cholangiocarcinoma.  [facility] consulted.  Subjective: Seen and examined earlier this morning.  No major events overnight of this morning.  No complaint this morning.  He denies chest pain, dyspnea, GI or UTI symptoms.  Lying in bed and reading Bible.  Objective: Vitals:   03/20/20 2056 03/20/20 2355 03/21/20 0335 03/21/20 0852  BP: 125/83 124/85 135/79 134/77  Pulse: 70 64 64 (!) 59  Resp: 16 17 17 13   Temp: 97.9 F (36.6 C) 98 F (36.7 C) 98.2 F (36.8 C)  98.4 F (36.9 C)  TempSrc: Oral  Oral Oral  SpO2: 99% 98% 99% 100%  Weight:      Height:        Intake/Output Summary (Last 24 hours) at 03/21/2020 1014 Last data filed at 03/21/2020 1005 Gross per 24 hour  Intake 2576.67 ml  Output 975 ml  Net 1601.67 ml   Filed Weights   03/19/20 1936  Weight: 55.3 kg    Examination:  GENERAL: Chronically ill-appearing.  No apparent distress. HEENT: MMM.  Vision and hearing grossly intact.  NECK: Supple.  No apparent JVD.  RESP: On RA.  No IWOB.  Fair aeration bilaterally. CVS:  RRR. Heart sounds normal.  ABD/GI/GU: Bowel sounds present. Soft. Non tender.  Percutaneous biliary drain with slight green output. MSK/EXT:  Moves extremities.  Global muscle mass and subcu fat loss. SKIN: no apparent skin lesion or wound NEURO: Awake, alert and oriented appropriately.  No apparent focal neuro deficit. PSYCH: Calm.  Poor insight into his situation.  Procedures:  None  Microbiology summarized: COVID-19 PCR negative. Blood cultures NGTD Biliary fluid culture with GNR, GPC and GPR  Assessment & Plan: Acute cholecystitis with common bile duct obstruction status post percutaneous drain placement by IR on 3/5 and ERCP with biliary sphincterectomy and brush biopsy on 3/4.  Pathology with brush biopsy negative for malignancy at that time.  His CA 19-9 and CEA were elevated.  He had drain exchange by IR on 4/7.  Returns with unintentional weight loss, poor p.o. intake, nausea, hyponatremia and AKI.  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  Biliary fluid culture with GNR, GPR and GPC.  Mild leukocytosis but improving.  Blood cultures NGTD. -Appreciate GI input. -Continue meropenem pending biliary fluid culture speciation and sensitivity -Will curbside IR this afternoon -Follow repeat CA 19-9 and CEA. -Trend leukocytosis-improving. -Follow biliary culture speciation and sensitivity -Patient has outpatient follow-up with general surgery, [name] at [facility]  on 4/19 at 1 PM.  Hopefully we can discharge him in the morning so that he can get to this appointment.  Hypovolemic hyponatremia: Likely due to dehydration in the setting of poor p.o. intake.  Urine sodium appropriately low.  Hyponatremia improving. -Continue IV NS  Acute kidney injury/azotemia: Likely prerenal in the setting of dehydration/poor p.o. intake.  Not on nephrotoxic meds.  Baseline creatinine~1.0> 2.09 (admit)> 1.66>> 0.9 -Continue IVF -Continue monitoring -Avoid nephrotoxic meds.  Non-anion gap metabolic acidosis: Likely due to AKI and dehydration.  Resolved.  Elevated alkaline phosphatase: Likely due to #1. -Continue monitoring  Hyperproteinemia/elevated protein gap: Due to dehydration.  Resolved.  Essential hypertension: Normotensive.  On amlodipine at home. -Continue home amlodipine  Hypothyroidism: TSH elevated.  -Continue home Synthroid -Recheck TSH in 3 to 4 weeks outpatient.  Leukocytosis: Partly from dehydration.  Biliary culture also concerning.  Improved. -Continue monitoring  Severe malnutrition/unintentional weight loss in the setting of poor p.o. intake: Body mass index is 19.09 kg/m.  Significant muscle mass and subcu fat loss. -Consult dietitian  Debility/generalized weakness -PT/OT eval  Wt Readings from Last 10 Encounters:  03/19/20 55.3 kg  03/10/20 55.3 kg  02/05/20 55.8 kg  02/02/20 59.2 kg  06/25/18 56.7 kg  03/06/18 59 kg  09/09/16 61.2 kg  11/06/15 58.6 kg                  DVT prophylaxis: Subcu Lovenox Code Status: Full code Family Communication: Updated patient's wife over the phone.  Discharge barrier: Acute cholecystitis with positive biliary fluid culture requiring IV antibiotics pending culture speciation and sensitivity Patient is from: Home Final disposition: Likely home on 4/19 so he can follow-up with his surgeon outpatient.  Consultants:  Gastroenterology   Sch Meds:  Scheduled Meds: . amLODipine  10  mg Oral Daily  . aspirin EC  81 mg Oral Daily  . enoxaparin (LOVENOX) injection  30 mg Subcutaneous Q24H  . fluticasone  1 spray Each Nare Daily  . levothyroxine  137 mcg Oral QAC breakfast  . loratadine  10 mg Oral Daily  . multivitamin with minerals  1 tablet Oral Daily  . pantoprazole  40 mg Oral Daily   Continuous Infusions: . sodium chloride 75 mL/hr at 03/21/20 0332  . meropenem (MERREM) IV 1 g (03/21/20 0037)   PRN Meds:.acetaminophen **OR** acetaminophen, HYDROcodone-acetaminophen, ondansetron **OR** ondansetron (ZOFRAN) IV, polyethylene glycol, traZODone  Antimicrobials: Anti-infectives (From admission, onward)   Start     Dose/Rate Route Frequency Ordered Stop   03/20/20 0130  meropenem (MERREM) 1 g in sodium chloride 0.9 % 100 mL IVPB     1 g 200 mL/hr over 30 Minutes Intravenous Every 12 hours 03/20/20 0109         I have personally reviewed the following labs and images: CBC: Recent Labs  Lab 03/19/20 1940 03/20/20 0525 03/21/20 0444  WBC 14.2* 13.3* 10.6*  NEUTROABS 10.4* 9.8* 7.6  HGB 15.5 15.1 13.3  HCT 46.6 46.3 40.1  MCV 70.4* 70.4* 69.3*  PLT 578* 545* 447*   BMP &GFR Recent Labs  Lab 03/19/20 1940 03/20/20 0525 03/20/20  [id number] 03/20/20 1214 03/21/20 0444  NA 126* 126*  128* 128* 128* 131*  K 5.1 4.7  4.9 4.1 5.2* 4.4  CL 95* 95*  95* 95* 98 101  CO2 18* 18*  20* 25 18* 24  GLUCOSE 116* 104*  107* 134* 95 98  BUN 57* 56*  58* 52* 46* 20  CREATININE 2.09* 1.76*  1.67* 1.66* 1.32* 0.90  CALCIUM 10.3 10.0  10.3 10.0 9.4 9.0  MG  --   --   --   --  1.9  PHOS  --  5.1* 4.1 3.7  --    Estimated Creatinine Clearance: 58 mL/min (by C-G formula based on SCr of 0.9 mg/dL). Liver & Pancreas: Recent Labs  Lab 03/19/20 1940 03/20/20 0525 03/20/20 0839 03/20/20 1214 03/21/20 0444  AST 26 22  --   --  20  ALT 38 36  --   --  26  ALKPHOS 221* 203*  --   --  150*  BILITOT 0.8 1.0  --   --  1.0  PROT 9.3* 8.9*  --   --  7.3  ALBUMIN 4.1  4.2  4.1 3.9 3.6 3.2*   No results for input(s): LIPASE, AMYLASE in the last 168 hours. No results for input(s): AMMONIA in the last 168 hours. Diabetic: No results for input(s): HGBA1C in the last 72 hours. No results for input(s): GLUCAP in the last 168 hours. Cardiac Enzymes: No results for input(s): CKTOTAL, CKMB, CKMBINDEX, TROPONINI in the last 168 hours. No results for input(s): PROBNP in the last 8760 hours. Coagulation Profile: No results for input(s): INR, PROTIME in the last 168 hours. Thyroid Function Tests: Recent Labs    03/21/20 0444  TSH 12.674*   Lipid Profile: No results for input(s): CHOL, HDL, LDLCALC, TRIG, CHOLHDL, LDLDIRECT in the last 72 hours. Anemia Panel: No results for input(s): VITAMINB12, FOLATE, FERRITIN, TIBC, IRON, RETICCTPCT in the last 72 hours. Urine analysis:    Component Value Date/Time   COLORURINE YELLOW 03/19/2020 2332   APPEARANCEUR HAZY (A) 03/19/2020 2332   LABSPEC 1.017 03/19/2020 2332   PHURINE 5.0 03/19/2020 2332   GLUCOSEU NEGATIVE 03/19/2020 2332   HGBUR NEGATIVE 03/19/2020 2332   BILIRUBINUR NEGATIVE 03/19/2020 2332   KETONESUR NEGATIVE 03/19/2020 2332   PROTEINUR NEGATIVE 03/19/2020 2332   UROBILINOGEN 1.0 09/04/2015 2309   NITRITE NEGATIVE 03/19/2020 2332   LEUKOCYTESUR NEGATIVE 03/19/2020 2332   Sepsis Labs: Invalid input(s): PROCALCITONIN, [place]  Microbiology: Recent Results (from the past 240 hour(s))  SARS CORONAVIRUS 2 (TAT 6-24 HRS) Nasopharyngeal Nasopharyngeal Swab     Status: None   Collection Time: 03/20/20 12:50 AM   Specimen: Nasopharyngeal Swab  Result Value Ref Range Status   SARS Coronavirus 2 NEGATIVE NEGATIVE Final    Comment: (NOTE) SARS-CoV-2 target nucleic acids are NOT DETECTED. The SARS-CoV-2 RNA is generally detectable in upper and lower respiratory specimens during the acute phase of infection. Negative results do not preclude SARS-CoV-2 infection, do not rule out co-infections with  other pathogens, and should not be used as the sole basis for treatment or other patient management decisions. Negative results must be combined with clinical observations, patient history, and epidemiological information. The expected result is Negative. Fact Sheet for Patients: SugarRoll.be Fact Sheet for Healthcare Providers: https://www.woods-mathews.com/ This test is not yet approved or cleared by the [place] FDA and  has been authorized for detection and/or diagnosis of SARS-CoV-2 by FDA under an Emergency Use Authorization (EUA). This EUA will remain  in effect (meaning this test can be used) for the duration of the COVID-19 declaration under Section 56 4(b)(1) of the Act, 21 U.S.C. section 360bbb-3(b)(1), unless the authorization is terminated or revoked sooner. Performed at [facility], [place] [address], [place], [place] [address]   Body fluid culture     Status: None (Preliminary result)   Collection Time: 03/20/20  5:19 AM   Specimen: Body Fluid  Result Value Ref Range Status   Specimen Description FLUID  Final   Special Requests DRAIN  Final   Gram Stain   Final    RARE WBC PRESENT, PREDOMINANTLY MONONUCLEAR ABUNDANT GRAM NEGATIVE RODS ABUNDANT GRAM POSITIVE COCCI IN PAIRS MODERATE GRAM POSITIVE RODS Performed at [facility], [place] [address], [place], [place] [address]    Culture PENDING  Incomplete   Report Status PENDING  Incomplete  Culture, blood (routine x 2)     Status: None (Preliminary result)   Collection Time: 03/20/20  5:25 AM   Specimen: BLOOD RIGHT HAND  Result Value Ref Range Status   Specimen Description BLOOD RIGHT HAND  Final   Special Requests   Final    BOTTLES DRAWN AEROBIC AND ANAEROBIC Blood Culture adequate volume   Culture   Final    NO GROWTH 1 DAY Performed at [facility], [place] [address] [place], [place] [address]    Report Status PENDING  Incomplete  Culture, blood  (routine x 2)     Status: None (Preliminary result)   Collection Time: 03/20/20  5:25 AM   Specimen: BLOOD  Result Value Ref Range Status   Specimen Description BLOOD RIGHT ANTECUBITAL  Final   Special Requests   Final    BOTTLES DRAWN AEROBIC AND ANAEROBIC Blood Culture adequate volume   Culture   Final    NO GROWTH 1 DAY Performed at [facility], [place] [address], [place], [place] [address]    Report Status PENDING  Incomplete    Radiology Studies: No results found.   Glorimar Stroope T. Candor  If 7PM-7AM, please contact night-coverage [email or web address] Password [email or web address] 03/21/2020, 10:14 AM

## 2020-03-21 NOTE — Progress Notes (Signed)
Occupational Therapy Evaluation Only Patient Details Name: Rodney Romero. MRN: [id number] DOB: February 27, 1946 Today's Date: 03/21/2020    History of Present Illness Pt is a 74 yo male presenting with hyponatremia, AKI, and acute cholecystitis. 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 PMH includes recent admission (3/2-3/9) for same dx with ERCP on 02/05/20 with unsuccessful cannulation of the CBD, HTN, bipolar disorder, and alcohol abuse.   Clinical Impression   PTA pt resided with his wife, independent in all ADL, IADL, and mobility tasks. Pt does not ambulate with an assistive device and reports 0 falls in the last 6 months. Pt able to ambulate around room with supervision for line management only, noting 0 instances of LOB. Pt completed toileting task, simulated tub/shower transfer, hygiene at the sink, and LB dressing task with supervision for line management. Educated pt on additional safety strategies and activity pacing techniques at home with good understanding. Pt appears to be functioning at/near baseline for self-care and functional transfer tasks. No further skilled OT services warranted at this time.     Follow Up Recommendations  No OT follow up;Supervision - Intermittent    Equipment Recommendations  3 in 1 bedside commode(for use in shower)    Recommendations for Other Services       Precautions / Restrictions Precautions Precautions: Other (comment) Precaution Comments: Biliary drain Restrictions Weight Bearing Restrictions: No      Mobility Bed Mobility               General bed mobility comments: Pt received from PT in hallway ambulating.   Transfers Overall transfer level: Independent Equipment used: None(pt able to manage IV pole)             General transfer comment: Noted 0 instances of LOB.     Balance Overall balance assessment: No apparent balance deficits (not formally assessed)                                          ADL either performed or assessed with clinical judgement   ADL Overall ADL's : At baseline                                       General ADL Comments: Pt able to ambulate around room with supervision only to ensure safety with IV pole. Noted 0 instances of LOB. Pt completed toileting task, simulated tub/shower transfer, hygiene at the sink, and LB dressing task with supervision for line management only.      Vision Baseline Vision/History: Wears glasses Wears Glasses: At all times       Perception     Praxis      Pertinent Vitals/Pain Pain Assessment: No/denies pain     Hand Dominance Right   Extremity/Trunk Assessment Upper Extremity Assessment Upper Extremity Assessment: Overall WFL for tasks assessed   Lower Extremity Assessment Lower Extremity Assessment: Defer to PT evaluation       Communication Communication Communication: No difficulties(raspy voice)   Cognition Arousal/Alertness: Awake/alert Behavior During Therapy: WFL for tasks assessed/performed Overall Cognitive Status: Within Functional Limits for tasks assessed                                 General Comments:  A&Ox 4. Pt pleasant and willing to participate in therapy. Pt able to follow multi-step commands.    General Comments  No signs/symptoms of distress    Exercises     Shoulder Instructions      Home Living Family/patient expects to be discharged to:: Private residence Living Arrangements: Spouse/significant other Available Help at Discharge: Family;Available 24 hours/day Type of Home: House Home Access: Stairs to enter [employer] of Steps: 3 Entrance Stairs-Rails: Can reach both Home Layout: One level     Bathroom Shower/Tub: Teacher, early years/pre: Standard     Home Equipment: Grab bars - tub/shower;Walker - 2 wheels;Cane - single point          Prior Functioning/Environment Level of  Independence: Independent        Comments: Pt reports being independent in all ADL, IADL, and mobility tasks. Pt does not ambulate with an assistive device and reports 0 falls in the last 6 months. Pt still drives.         OT Problem List:        OT Treatment/Interventions:      OT Goals(Current goals can be found in the care plan section)    OT Frequency:     Barriers to D/C:            Co-evaluation              AM-PAC OT "6 Clicks" Daily Activity     Outcome Measure Help from another person eating meals?: None Help from another person taking care of personal grooming?: None Help from another person toileting, which includes using toliet, bedpan, or urinal?: None Help from another person bathing (including washing, rinsing, drying)?: A Little Help from another person to put on and taking off regular upper body clothing?: None Help from another person to put on and taking off regular lower body clothing?: None 6 Click Score: 23   End of Session Nurse Communication: Mobility status  Activity Tolerance: Patient tolerated treatment well Patient left: in chair;with call bell/phone within reach;with chair alarm set  OT Visit Diagnosis: Muscle weakness (generalized) (M62.81)                Time: 0955-1010 OT Time Calculation (min): 15 min Charges:  OT General Charges $OT Visit: 1 Visit OT Evaluation $OT Eval Low Complexity: 1 Low  Mauri Brooklyn OTR/L [phone]  Mauri Brooklyn 03/21/2020, 10:20 AM

## 2020-03-21 NOTE — Evaluation (Signed)
Physical Therapy Evaluation Patient Details Name: Rodney Romero. MRN: [id number] DOB: Aug 22, 1946 Today's Date: 03/21/2020   History of Present Illness  Pt is a 74 yo male presenting with hyponatremia, AKI, and acute cholecystitis. 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 PMH includes recent admission (3/2-3/9) for same dx with ERCP on 02/05/20 with unsuccessful cannulation of the CBD, HTN, bipolar disorder, and alcohol abuse.  Clinical Impression  Pt in bed upon arrival of PT, agreeable to evaluation at this time. Prior to admission the pt was independent without use of AD, and reports no sig difficulties or falls since last d/c home in early march. The pt now presents with limitations in functional mobility, power, and dynamic stability due to above dx, and will continue to benefit from skilled PT to address these deficits. The pt was able to demo good bed mobility and transfers without assist, but demos some generalized weakness and instability with higher-level dynamic tasks, and therefore will continue to benefit from skilled PT during acute stay to maximize strength and safety.      Follow Up Recommendations No PT follow up    Equipment Recommendations  None recommended by PT    Recommendations for Other Services       Precautions / Restrictions Precautions Precautions: Other (comment) Precaution Comments: Biliary drain Restrictions Weight Bearing Restrictions: No      Mobility  Bed Mobility Overal bed mobility: Modified Independent             General bed mobility comments: pt able to demo good management of drain and mobilize without assist  Transfers Overall transfer level: Independent Equipment used: None             General transfer comment: Noted 0 instances of LOB.   Ambulation/Gait Ambulation/Gait assistance: Supervision Gait Distance (Feet): 100 Feet Assistive device: IV Pole Gait Pattern/deviations:  Step-through pattern Gait velocity: 0.8 m/s Gait velocity interpretation: 1.31 - 2.62 ft/sec, indicative of limited community ambulator General Gait Details: pt able to ambulate with use of IV pole without any LOB or need for assist.  Stairs            Wheelchair Mobility    Modified Rankin (Stroke Patients Only)       Balance Overall balance assessment: Needs assistance Sitting-balance support: No upper extremity supported;Feet supported Sitting balance-Leahy Scale: Normal     Standing balance support: Single extremity supported Standing balance-Leahy Scale: Good                               Pertinent Vitals/Pain Pain Assessment: No/denies pain    Home Living Family/patient expects to be discharged to:: Private residence Living Arrangements: Spouse/significant other Available Help at Discharge: Family;Available 24 hours/day Type of Home: House Home Access: Stairs to enter Entrance Stairs-Rails: Can reach both Entrance Stairs-Number of Steps: 3 Home Layout: One level Home Equipment: Grab bars - tub/shower;Walker - 2 wheels;Cane - single point Additional Comments: pt reports no difficulty navigating in home since last d/c    Prior Function Level of Independence: Independent         Comments: Pt reports being independent in all ADL, IADL, and mobility tasks. Pt does not ambulate with an assistive device and reports 0 falls in the last 6 months. Pt still drives.      Hand Dominance   Dominant Hand: Right    Extremity/Trunk Assessment   Upper Extremity Assessment Upper Extremity Assessment:  Overall [facility] for tasks assessed    Lower Extremity Assessment Lower Extremity Assessment: Overall WFL for tasks assessed    Cervical / Trunk Assessment Cervical / Trunk Assessment: Normal  Communication   Communication: No difficulties(raspy voice)  Cognition Arousal/Alertness: Awake/alert Behavior During Therapy: WFL for tasks  assessed/performed Overall Cognitive Status: Within Functional Limits for tasks assessed                                 General Comments: A&Ox 4. Pt pleasant and willing to participate in therapy. Pt able to follow multi-step commands.       General Comments General comments (skin integrity, edema, etc.): pt with generally flat affect, but cooperative and agreeable to therapy    Exercises     Assessment/Plan    PT Assessment Patient needs continued PT services  PT Problem List Decreased strength;Decreased mobility;Decreased range of motion;Decreased coordination;Decreased balance       PT Treatment Interventions Therapeutic exercise;Gait training;Functional mobility training;Stair training;Therapeutic activities;Patient/family education;Balance training    PT Goals (Current goals can be found in the Care Plan section)  Acute Rehab PT Goals Patient Stated Goal: return home PT Goal Formulation: With patient Time For Goal Achievement: 04/05/20 Potential to Achieve Goals: Good    Frequency Min 3X/week   Barriers to discharge        Co-evaluation               AM-PAC PT "6 Clicks" Mobility  Outcome Measure Help needed turning from your back to your side while in a flat bed without using bedrails?: None Help needed moving from lying on your back to sitting on the side of a flat bed without using bedrails?: None Help needed moving to and from a bed to a chair (including a wheelchair)?: A Little Help needed standing up from a chair using your arms (e.g., wheelchair or bedside chair)?: None Help needed to walk in hospital room?: A Little Help needed climbing 3-5 steps with a railing? : A Little 6 Click Score: 21    End of Session Equipment Utilized During Treatment: Gait belt Activity Tolerance: Patient tolerated treatment well Patient left: with nursing/sitter in room;Other (comment)(standing with OT in room) Nurse Communication: Mobility status PT  Visit Diagnosis: Difficulty in walking, not elsewhere classified (R26.2)    Time: [id number] PT Time Calculation (min) (ACUTE ONLY): 23 min   Charges:   PT Evaluation $PT Eval Low Complexity: 1 Low PT Treatments $Gait Training: 8-22 mins        Karma Ganja, PT, DPT   Acute Rehabilitation Department Pager #: [phone]  Otho Bellows 03/21/2020, 12:59 PM

## 2020-03-22 DIAGNOSIS — E440 Moderate protein-calorie malnutrition: Secondary | ICD-10-CM

## 2020-03-22 LAB — CBC
HCT: 35.7 % — ABNORMAL LOW (ref 39.0–52.0)
Hemoglobin: 12 g/dL — ABNORMAL LOW (ref 13.0–17.0)
MCH: 23.3 pg — ABNORMAL LOW (ref 26.0–34.0)
MCHC: 33.6 g/dL (ref 30.0–36.0)
MCV: 69.3 fL — ABNORMAL LOW (ref 80.0–100.0)
Platelets: 408 10*3/uL — ABNORMAL HIGH (ref 150–400)
RBC: 5.15 MIL/uL (ref 4.22–5.81)
RDW: 13.7 % (ref 11.5–15.5)
WBC: 11.3 10*3/uL — ABNORMAL HIGH (ref 4.0–10.5)
nRBC: 0 % (ref 0.0–0.2)

## 2020-03-22 LAB — RENAL FUNCTION PANEL
Albumin: 2.8 g/dL — ABNORMAL LOW (ref 3.5–5.0)
Anion gap: 10 (ref 5–15)
BUN: 10 mg/dL (ref 8–23)
CO2: 23 mmol/L (ref 22–32)
Calcium: 8.8 mg/dL — ABNORMAL LOW (ref 8.9–10.3)
Chloride: 100 mmol/L (ref 98–111)
Creatinine, Ser: 0.66 mg/dL (ref 0.61–1.24)
GFR calc Af Amer: >60 mL/min (ref >60)
GFR calc non Af Amer: >60 mL/min (ref >60)
Glucose, Bld: 82 mg/dL (ref 70–99)
Phosphorus: 1.8 mg/dL — ABNORMAL LOW (ref 2.5–4.6)
Potassium: 3.5 mmol/L (ref 3.5–5.1)
Sodium: 133 mmol/L — ABNORMAL LOW (ref 135–145)

## 2020-03-22 LAB — CYTOLOGY - NON PAP

## 2020-03-22 LAB — MAGNESIUM: Magnesium: 1.8 mg/dL (ref 1.7–2.4)

## 2020-03-22 MED ORDER — MIRTAZAPINE 15 MG PO TABS: 15.0000 mg | ORAL_TABLET | Freq: Every day | ORAL | Status: DC

## 2020-03-22 MED ORDER — SENNOSIDES-DOCUSATE SODIUM 8.6-50 MG PO TABS: 1.0000 | ORAL_TABLET | Freq: Two times a day (BID) | ORAL | 0 refills | Status: DC | PRN

## 2020-03-22 MED ORDER — POLYETHYLENE GLYCOL 3350 17 GM/SCOOP PO POWD: 17.0000 g | Freq: Two times a day (BID) | ORAL | 2 refills | Status: DC | PRN

## 2020-03-22 MED ORDER — AMOXICILLIN-POT CLAVULANATE 875-125 MG PO TABS: 1.0000 | ORAL_TABLET | Freq: Two times a day (BID) | ORAL | 0 refills | Status: DC

## 2020-03-22 MED FILL — AMOX-CLAV 875-125 MG TABLET: 875-125 | 5 days supply | Qty: 10 | Fill #0

## 2020-03-22 MED FILL — POLYETHYLENE GLYCOL 3350 PO: 17 | 7 days supply | Qty: 238 | Fill #0

## 2020-03-22 NOTE — Discharge Summary (Signed)
Physician Discharge Summary  Rodney Romero. [id number] DOB: [birth year] DOA: 03/19/2020  PCP: [facility]  Admit date: 03/19/2020 Discharge date: 03/22/2020  Admitted From: Home. Disposition: Home  Recommendations for Outpatient Follow-up:  1. Follow ups as below. 2. Please obtain CBC/BMP/Mag at follow up 3. Please follow up on the following pending results: Biliary fluid culture speciation and sensitivity  Home Health: None Equipment/Devices: 3 in 1 commode  Discharge Condition: Stable CODE STATUS: Full code   Hospital Course: 74 year old male with history of cholecystitis/common bile duct obstruction s/p percutaneous drain, HTN, hypothyroidism and GERD presenting with generalized weakness, unintentional weight loss, poor p.o. intake, nausea, hyponatremia and AKI.  Apparently, he presented to PCP and had a blood work.  Was told to have hyponatremia and AKI and directed to ED by PCP.  Of note, patient was hospitalized here from 3/2-3/9 for acute cholecystitis/CBD obstruction and underwent percutaneous drain and biliary stent placement.  Brush biopsy at that time negative for malignancy.  He was seen in [facility] ED on 4/2 for abdominal pain and cleared for discharge by general surgery home to follow-up with GI outpatient.  Patient was to follow-up with Dr. Carlis Romero at [facility] surgery on 4/19 but presented to PCP on 4/17 and found to have hyponatremia and AKI.  In ED, hemodynamically stable. Na 126.  Creatinine 2.09 (baseline 1.0).  BUN 57.  ALP 221.  Total protein 9.3.  Albumin 4.1.  WBC 14.2.  Platelet 578.  UA without significant finding.  COVID-19 PCR negative.  He was started on meropenem for presumed acute cholecystitis and admitted.  MRCP ordered.  MRCP concerning for new or progressive soft tissue mass centered in the porta hepatis and likely involving the pancreatic head concerning for cholangiocarcinoma.  [facility] consulted and recommended outpatient endoscopic  ultrasound.  Discussed with IR, Dr. Jacqualyn Posey on 4/18 who did not feel there is any need for IR intervention as the percutaneous drain is working appropriately, and suggested outpatient follow-up as previously scheduled.  Per IR, biliary fluid culture could be just colonization at this point.  Patient remained afebrile.  Leukocytosis improved.  He was discharged on Augmentin for 5 more days.  Patient to go to his appointment with general surgery at [facility].   Patient's AKI resolved with hydration.  Hyponatremia improved.   See individual problem list below for more hospital course.  Discharge Diagnoses:  Acute cholecystitis with common bile duct obstruction status post percutaneous drain placement by IR on 3/5 and ERCP with biliary sphincterectomy and brush biopsy on 3/4.  Pathology with brush biopsy negative for malignancy at that time.  His CA 19-9 and CEA were elevated.  He had drain exchange by IR on 4/7. Returns with "unintentional weight loss", poor p.o. intake, nausea, hyponatremia and AKI.  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  Biliary fluid culture with Klebsiella pneumonia and Proteus mirabilis. Blood cultures NGTD.  GI recommended outpatient EUS.  IR recommended outpatient follow-up.  Patient received meropenem for 2 days and discharged on Augmentin for 5 more days.  Biliary culture sensitivity pending.  Patient to go to his appointment with general surgery, Dr. Carlis Romero at [facility] today at 1 PM.   Hypovolemic hyponatremia: Likely due to dehydration in the setting of poor p.o. intake.  Urine sodium appropriately low.  Hyponatremia improved, 133 on discharge. -Recheck BMP at follow-up.  Acute kidney injury/azotemia: Likely prerenal in the setting of dehydration/poor p.o. intake.  Not on nephrotoxic meds.  Baseline creatinine~1.0> 2.09 (admit)> 1.66>> 0.9> 0.66.  BUN 58>> 10.  AKI and azotemia resolved. -Encouraged appropriate hydration.  Non-anion gap metabolic acidosis: Likely due to AKI and  dehydration.  Resolved.  Elevated alkaline phosphatase: Likely due to #1.  Improved. -Recheck LFT at follow-up  Hyperproteinemia/elevated protein gap: Due to dehydration.  Resolved.  Essential hypertension: Normotensive.  On amlodipine at home. -Continue home amlodipine  Hypothyroidism: TSH elevated.   Noncompliance? -Continue home Synthroid -Recheck TSH in 3 to 4 weeks outpatient.  Leukocytosis: Partly from dehydration.  Biliary culture also concerning.  Improved. -Recheck CBC at follow-up.  Debility/generalized weakness-evaluated by PT/OT and no need was identified other than bedside 3 in 1 commode.  Severe malnutrition/unintentional weight loss in the setting of poor p.o. intake: Body mass index is 19.09 kg/m.  Significant muscle mass and subcu fat loss.  Weight seems to be relatively stable. -Continue multivitamin and Remeron.    Wt Readings from Last 10 Encounters:  03/19/20 55.3 kg  03/10/20 55.3 kg  02/05/20 55.8 kg  02/02/20 59.2 kg  06/25/18 56.7 kg  03/06/18 59 kg  09/09/16 61.2 kg  11/06/15 58.6 kg                    Discharge Instructions  Discharge Instructions    Call MD for:  extreme fatigue   Complete by: As directed    Call MD for:  persistant dizziness or light-headedness   Complete by: As directed    Call MD for:  persistant nausea and vomiting   Complete by: As directed    Call MD for:  severe uncontrolled pain   Complete by: As directed    Call MD for:  temperature >100.4   Complete by: As directed    Diet general   Complete by: As directed    Discharge instructions   Complete by: As directed    It has been a pleasure taking care of you! You were hospitalized with acute kidney injury and low sodium likely due to dehydration.  We have treated you with intravenous fluid.  With that, your kidney injury resolved.  Your sodium level improved.  We strongly encourage you to maintain good hydration.  Please continue antibiotics for  the next 5 days. Please review your new medication list and the directions before you take your medications.  Please keep your appointments with your surgeon and interventional radiology.   The gastroenterologist office will arrange outpatient follow-up.    Take care,   Increase activity slowly   Complete by: As directed      Allergies as of 03/22/2020   No Known Allergies     Medication List    STOP taking these medications   traZODone 50 MG tablet Commonly known as: DESYREL     TAKE these medications   amLODipine 10 MG tablet Commonly known as: NORVASC Take 10 mg by mouth daily.   amoxicillin-clavulanate 875-125 MG tablet Commonly known as: AUGMENTIN Take 1 tablet by mouth every 12 (twelve) hours. What changed: when to take this   aspirin EC 81 MG tablet Take 81 mg by mouth daily.   fluticasone 50 MCG/ACT nasal spray Commonly known as: FLONASE Place 1 spray into both nostrils daily.   HYDROcodone-acetaminophen 5-325 MG tablet Commonly known as: NORCO/VICODIN Take 1-2 tablets by mouth every 6 (six) hours as needed for moderate pain or severe pain.   levothyroxine 150 MCG tablet Commonly known as: SYNTHROID Take 150 mcg by mouth daily before breakfast. What changed: Another medication with the same name was  removed. Continue taking this medication, and follow the directions you see here.   loratadine 10 MG tablet Commonly known as: CLARITIN Take 10 mg by mouth daily.   mirtazapine 15 MG tablet Commonly known as: REMERON Take 1 tablet (15 mg total) by mouth at bedtime. What changed:   when to take this  reasons to take this   multivitamin with minerals tablet Take 1 tablet by mouth daily. What changed: Another medication with the same name was removed. Continue taking this medication, and follow the directions you see here.   omeprazole 20 MG capsule Commonly known as: PRILOSEC Take 20 mg by mouth daily.   polyethylene glycol powder 17 GM/SCOOP  powder Commonly known as: MiraLax Take 17 g by mouth 2 (two) times daily as needed for moderate constipation.   senna-docusate 8.6-50 MG tablet Commonly known as: Senokot-S Take 1 tablet by mouth 2 (two) times daily between meals as needed for mild constipation.            Durable Medical Equipment  (From admission, onward)         Start     Ordered   03/22/20 0722  For home use only DME Bedside commode  Once    Comments: 3 in 1  Question:  Patient needs a bedside commode to treat with the following condition  Answer:  Generalized weakness   03/22/20 0721          Consultations:  Gastroenterology  IR over the phone  Procedures/Studies:   CT ABDOMEN PELVIS W CONTRAST  Result Date: 03/05/2020 CLINICAL DATA:  Fever and abdominal pain. Internal external biliary drain placement for CBD obstruction placed a month ago. EXAM: CT ABDOMEN AND PELVIS WITH CONTRAST TECHNIQUE: Multidetector CT imaging of the abdomen and pelvis was performed using the standard protocol following bolus administration of intravenous contrast. CONTRAST:  130mL OMNIPAQUE IOHEXOL 300 MG/ML  SOLN COMPARISON:  MRCP dated February 03, 2020. CT abdomen pelvis dated February 02, 2020. FINDINGS: Lower chest: No acute abnormality. Minimal right greater than left lower lobe subsegmental atelectasis. Hepatobiliary: No focal liver abnormality. Interval placement of a internal external biliary drain in good position, with resolved intra-and extrahepatic biliary dilatation. Persistent significant wall thickening of the gallbladder measuring up to 8 mm. No radiopaque gallstones. Pancreas: Unremarkable. No pancreatic ductal dilatation or surrounding inflammatory changes. Spleen: Normal in size without focal abnormality. Adrenals/Urinary Tract: The adrenal glands are unremarkable. Unchanged focal scarring in the upper poles of both kidneys. Unchanged 1.3 cm simple cyst in the left kidney. Unchanged 3.8 x 2.6 cm complex cystic lesion in  the right kidney with multiple thin internal septations and coarse, thick mural calcification in the posterior medial aspect of the lesion. While it has increased in size since 2015, the lesion demonstrates no morphologic changes since then. Other tiny subcentimeter low-density lesions in the right kidney remain too small to characterize. No renal calculi or hydronephrosis. The bladder is unremarkable. Stomach/Bowel: Stomach is within normal limits. Appendix appears normal. No evidence of bowel wall thickening, distention, or inflammatory changes. Vascular/Lymphatic: Aortic atherosclerosis. No enlarged abdominal or pelvic lymph nodes. Reproductive: Unchanged prostatomegaly. Other: Unchanged tiny fat containing left inguinal hernia. No free fluid or pneumoperitoneum. Musculoskeletal: No acute or significant osseous findings. IMPRESSION: 1. Interval placement of an internal external biliary drain in good position, with resolved intra-and extrahepatic biliary dilatation. 2. 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 3. 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 While this lesion has increased in size since 2015, it demonstrates no morphologic changes since then. No further follow-up required. 4. Aortic Atherosclerosis (ICD10-I70.0). Electronically Signed   By: Titus Dubin M.D.   On: 03/05/2020 10:41   MR ABDOMEN MRCP W WO CONTAST  Result Date: 03/20/2020 CLINICAL DATA:  Cholecystitis with proximal common bile duct obstruction. Concern for malignancy. Prior negative brush biopsies of common bile duct. EXAM: MRI ABDOMEN WITHOUT AND WITH CONTRAST (INCLUDING MRCP) TECHNIQUE: Multiplanar multisequence MR imaging of the abdomen was performed both before and after the administration of intravenous contrast. Heavily T2-weighted images of the biliary and pancreatic ducts were obtained, and three-dimensional MRCP images were  rendered by post processing. CONTRAST:  39mL GADAVIST GADOBUTROL 1 MMOL/ML IV SOLN COMPARISON:  03/05/2020 CT.  02/03/2020 MRCP. FINDINGS: Portions of exam are mildly motion degraded. Lower chest: Normal heart size without pericardial or pleural effusion. Hepatobiliary: A subcentimeter right hepatic lobe lesion is too small to characterize. Again identified is and external to internal biliary stent, placed from the left hepatic lobe and terminated in the descending duodenum. There is mild concurrent intrahepatic biliary duct dilatation, most apparent in the right hepatic lobe. Gallbladder wall thickening is decreased, including at 4 mm on 19/9. Amorphous soft tissue is identified within the porta hepatis, extending from just medial to the gallbladder neck into the pancreatic head region. Example images 16-18 of series 9 and 18/18. This demonstrates delayed post-contrast enhancement, measuring on the order of 5.2 x 3.5 cm on 43/29. Surrounds the portal vein, which remains patent. Pancreas: The pancreatic body and tail are normal, without duct dilatation. The pancreatic head is likely involved by the porta hepatis process, including on 19/4. No superimposed pancreatitis. Spleen:  Normal in size, without focal abnormality. Adrenals/Urinary Tract: Normal adrenal glands. Bilateral renal cysts. A multiseptated lower pole right renal complex cyst measures 3.7 x 2.5 cm and has been present back to 12/18/2013. No hydronephrosis. Stomach/Bowel: Proximal gastric underdistention. No evidence of duodenal obstruction. Colonic stool burden suggests constipation. Vascular/Lymphatic: Advanced aortic atherosclerosis. No separate areas of abdominal adenopathy. Other:  No ascites. Musculoskeletal: No acute osseous abnormality. IMPRESSION: 1. New or progressive soft tissue mass centered in the porta hepatis and likely involving the pancreatic head. Favor cholangiocarcinoma. Given history of negative brushings, marked cholangitis is  possible but felt much less likely. Consider repeat tissue sampling. This may be amenable to endoscopic ultrasound visualization and sampling. 2. Left-sided external to internal biliary stent in place with mild intrahepatic biliary duct dilatation. 3.  Aortic Atherosclerosis (ICD10-I70.0). 4.  Possible constipation. 5. Decrease in minimal gallbladder wall thickening. Electronically Signed   By: Abigail Miyamoto M.D.   On: 03/20/2020 05:35   IR EXCHANGE BILIARY DRAIN  Result Date: 03/10/2020 INDICATION: 74 year old male with a history of common bile duct obstruction, drain placed 02/06/2020 with brush biopsy of common bile duct lesion. He presents today for exchange and up sizing EXAM: THROUGH THE TUBE CHOLANGIOGRAM EXCHANGE OF BILIARY DRAIN MEDICATIONS: 3.375 Zosyn; The antibiotic was administered within an appropriate time frame prior to the initiation of the procedure. ANESTHESIA/SEDATION: Moderate (conscious) sedation was employed during this procedure. A total of Versed 1.5 mg and Fentanyl 100 mcg was administered intravenously. Moderate Sedation Time: 10 minutes. The patient's level of consciousness and vital signs were monitored continuously by radiology nursing throughout the procedure under my direct supervision. FLUOROSCOPY TIME:  Fluoroscopy Time: 0 minutes 54 seconds (1 mGy). COMPLICATIONS: None PROCEDURE: Informed written consent was obtained from the patient  after a thorough discussion of the procedural risks, benefits and alternatives. All questions were addressed. Maximal Sterile Barrier Technique was utilized including caps, mask, sterile gowns, sterile gloves, sterile drape, hand hygiene and skin antiseptic. A timeout was performed prior to the initiation of the procedure. Patient positioned supine position on the fluoroscopy table. The drain was prepped and draped in the usual sterile fashion. 1% lidocaine was used for local anesthesia. Contrast was injected confirming location. Modified Seldinger  technique was then used to exchange the indwelling 8 [place] drain, with serial dilation performed of 10 [place] and 12 [place] and placement of a new 12 [place] drain. Contrast confirmed location and a final image was stored. Catheter was sutured in position and attached to gravity drainage. Patient tolerated the procedure well and remained hemodynamically stable throughout. No complications were encountered and no significant blood loss. IMPRESSION: Status post image guided exchange of internal/external biliary drain with placement of a new 12 French drain. Signed, Dulcy Fanny. Dellia Nims, [facility] Radiology PLAN: The patient will return to clinic with CMP in about 1 week for an injection an attempt at initiating capping trial. Electronically Signed   By: Corrie Mckusick D.O.   On: 03/10/2020 16:05       Discharge Exam: Vitals:   03/22/20 0804 03/22/20 1214  BP: 131/70 118/66  Pulse: 65 62  Resp: 18 18  Temp: 98.8 F (37.1 C) 98.7 F (37.1 C)  SpO2: 99% 99%    GENERAL: No acute distress.  Appears well.  HEENT: MMM.  Vision and hearing grossly intact.  NECK: Supple.  No apparent JVD.  RESP: On room air.  No IWOB. Good air movement bilaterally. CVS:  RRR. Heart sounds normal.  ABD/GI/GU: Bowel sounds present. Soft. Non tender.  Percutaneous biliary drain with greenish biliary output.  No purulence. MSK/EXT:  Moves extremities.  Some degree of muscle mass and subcu fat loss. SKIN: no apparent skin lesion or wound NEURO: Awake, alert and oriented appropriately.  No apparent focal neuro deficit. PSYCH: Calm.  Poor insight   The results of significant diagnostics from this hospitalization (including imaging, microbiology, ancillary and laboratory) are listed below for reference.     Microbiology: Recent Results (from the past 240 hour(s))  SARS CORONAVIRUS 2 (TAT 6-24 HRS) Nasopharyngeal Nasopharyngeal Swab     Status: None   Collection  Time: 03/20/20 12:50 AM   Specimen: Nasopharyngeal Swab  Result Value Ref Range Status   SARS Coronavirus 2 NEGATIVE NEGATIVE Final    Comment: (NOTE) SARS-CoV-2 target nucleic acids are NOT DETECTED. The SARS-CoV-2 RNA is generally detectable in upper and lower respiratory specimens during the acute phase of infection. Negative results do not preclude SARS-CoV-2 infection, do not rule out co-infections with other pathogens, and should not be used as the sole basis for treatment or other patient management decisions. Negative results must be combined with clinical observations, patient history, and epidemiological information. The expected result is Negative. Fact Sheet for Patients: SugarRoll.be Fact Sheet for Healthcare Providers: https://www.woods-mathews.com/ This test is not yet approved or cleared by the [place] FDA and  has been authorized for detection and/or diagnosis of SARS-CoV-2 by FDA under an Emergency Use Authorization (EUA). This EUA will remain  in effect (meaning this test can be used) for the duration of the COVID-19 declaration under Section 56 4(b)(1) of the Act, 21 U.S.C. section 360bbb-3(b)(1), unless the authorization is terminated or revoked sooner. Performed at [facility], [address] [place], [place] [address]   Body fluid culture     Status: None (Preliminary result)   Collection Time: 03/20/20  5:19 AM   Specimen: Body Fluid  Result Value Ref Range Status   Specimen Description FLUID  Final   Special Requests DRAIN  Final   Gram Stain   Final    RARE WBC PRESENT, PREDOMINANTLY MONONUCLEAR ABUNDANT GRAM NEGATIVE RODS ABUNDANT GRAM POSITIVE COCCI IN PAIRS MODERATE GRAM POSITIVE RODS Performed at [facility], [place] [address], [place], [place] [address]    Culture   Final    ABUNDANT KLEBSIELLA PNEUMONIAE ABUNDANT PROTEUS MIRABILIS    Report Status PENDING  Incomplete  Culture, blood  (routine x 2)     Status: None (Preliminary result)   Collection Time: 03/20/20  5:25 AM   Specimen: BLOOD RIGHT HAND  Result Value Ref Range Status   Specimen Description BLOOD RIGHT HAND  Final   Special Requests   Final    BOTTLES DRAWN AEROBIC AND ANAEROBIC Blood Culture adequate volume   Culture   Final    NO GROWTH 2 DAYS Performed at [facility], [place] [address], [place], [place] [address]    Report Status PENDING  Incomplete  Culture, blood (routine x 2)     Status: None (Preliminary result)   Collection Time: 03/20/20  5:25 AM   Specimen: BLOOD  Result Value Ref Range Status   Specimen Description BLOOD RIGHT ANTECUBITAL  Final   Special Requests   Final    BOTTLES DRAWN AEROBIC AND ANAEROBIC Blood Culture adequate volume   Culture   Final    NO GROWTH 2 DAYS Performed at [facility], [place] [address], [place], [place] [address]    Report Status PENDING  Incomplete     Labs: BNP (last 3 results) No results for input(s): BNP in the last 8760 hours. Basic Metabolic Panel: Recent Labs  Lab 03/20/20 0525 03/20/20 0839 03/20/20 1214 03/21/20 0444 03/22/20 0419  NA 126*  128* 128* 128* 131* 133*  K 4.7  4.9 4.1 5.2* 4.4 3.5  CL 95*  95* 95* 98 101 100  CO2 18*  20* 25 18* 24 23  GLUCOSE 104*  107* 134* 95 98 82  BUN 56*  58* 52* 46* 20 10  CREATININE 1.76*  1.67* 1.66* 1.32* 0.90 0.66  CALCIUM 10.0  10.3 10.0 9.4 9.0 8.8*  MG  --   --   --  1.9 1.8  PHOS 5.1* 4.1 3.7  --  1.8*   Liver Function Tests: Recent Labs  Lab 03/19/20 1940 03/19/20 1940 03/20/20 0525 03/20/20 0839 03/20/20 1214 03/21/20 0444 03/22/20 0419  AST 26  --  22  --   --  20  --   ALT 38  --  36  --   --  26  --   ALKPHOS 221*  --  203*  --   --  150*  --   BILITOT 0.8  --  1.0  --   --  1.0  --   PROT 9.3*  --  8.9*  --   --  7.3  --   ALBUMIN 4.1   < > 4.2  4.1 3.9 3.6 3.2* 2.8*   < > = values in this interval not displayed.   No results for input(s): LIPASE,  AMYLASE in the last 168 hours. No results for input(s): AMMONIA in the last 168 hours. CBC: Recent Labs  Lab 03/19/20 1940 03/20/20 0525 03/21/20 0444 03/22/20 0419  WBC 14.2* 13.3* 10.6* 11.3*  NEUTROABS 10.4* 9.8* 7.6  --   HGB 15.5 15.1 13.3 12.0*  HCT 46.6 46.3 40.1 35.7*  MCV 70.4* 70.4* 69.3* 69.3*  PLT 578* 545* 447* 408*   Cardiac Enzymes: No results for input(s): CKTOTAL, CKMB, CKMBINDEX, TROPONINI in the last 168 hours. BNP: Invalid input(s): POCBNP CBG: No results for input(s): GLUCAP in the last 168 hours. D-Dimer No results for input(s): DDIMER in the last 72 hours. Hgb A1c No results for input(s): HGBA1C in the last 72 hours. Lipid Profile No results for input(s): CHOL, HDL, LDLCALC, TRIG, CHOLHDL, LDLDIRECT in the last 72 hours. Thyroid function studies Recent Labs    03/21/20 0444  TSH 12.674*   Anemia work up No results for input(s): VITAMINB12, FOLATE, FERRITIN, TIBC, IRON, RETICCTPCT in the last 72 hours. Urinalysis    Component Value Date/Time   COLORURINE YELLOW 03/19/2020 2332   APPEARANCEUR HAZY (A) 03/19/2020 2332   LABSPEC 1.017 03/19/2020 2332   PHURINE 5.0 03/19/2020 2332   GLUCOSEU NEGATIVE 03/19/2020 2332   HGBUR NEGATIVE 03/19/2020 2332   BILIRUBINUR NEGATIVE 03/19/2020 2332   KETONESUR NEGATIVE 03/19/2020 2332   PROTEINUR NEGATIVE 03/19/2020 2332   UROBILINOGEN 1.0 09/04/2015 2309   NITRITE NEGATIVE 03/19/2020 2332   LEUKOCYTESUR NEGATIVE 03/19/2020 2332   Sepsis Labs Invalid input(s): PROCALCITONIN,  WBC,  LACTICIDVEN   Time coordinating discharge: 40 minutes  SIGNED:  Mercy Riding, MD  [facility] 03/22/2020, 5:54 PM  If 7PM-7AM, please contact night-coverage [email or web address] Password [email or web address]

## 2020-03-22 NOTE — Progress Notes (Signed)
Discharged to home after IV access removed.Transported by w/c to wife's vehicle.

## 2020-03-22 NOTE — TOC Transition Note (Addendum)
Transition of Care [facility]) - CM/SW Discharge Note   Patient Details  Name: Rodney Romero. MRN: [id number] Date of Birth: 1946/06/14  Transition of Care [facility]) CM/SW Contact:  Pollie Friar, RN Phone Number: 03/22/2020, 11:21 AM   Clinical Narrative:    [facility] issues with [facility] and states his wife provides needed transportation. Pt discharging home with self care. No f/u per PT/OT. Recommendations for 3 in 1 but pt is currently refusing.  Per pt his spouse can provide intermittent supervision.  Wife to provide transport home.   Final next level of care: Home/Self Care Barriers to Discharge: No Barriers Identified   Patient Goals and CMS Choice        Discharge Placement                       Discharge Plan and Services                                     Social Determinants of Health (SDOH) Interventions     Readmission Risk Interventions Readmission Risk Prevention Plan 02/03/2020  Post Dischage Appt Not Complete  Appt Comments disposition pending  Medication Screening Complete  Transportation Screening Complete  Some recent data might be hidden

## 2020-03-22 NOTE — Progress Notes (Signed)
Patient ID: Rodney Romero., male   DOB: [birth year], 74 y.o.   MRN: [id number]  Since he is seeing [facility] surgery this week, then defer any additional ERCP or EUS to [place]. If EUS needed locally as outpt then let us know. Will sign off. Call if questions.

## 2020-03-23 ENCOUNTER — Other Ambulatory Visit: Payer: Medicare Other

## 2020-03-25 LAB — CULTURE, BLOOD (ROUTINE X 2)
Culture: NO GROWTH
Culture: NO GROWTH
Special Requests: ADEQUATE
Special Requests: ADEQUATE

## 2020-03-25 LAB — BODY FLUID CULTURE

## 2020-04-28 ENCOUNTER — Emergency Department (HOSPITAL_COMMUNITY): Payer: Medicare Other

## 2020-04-28 ENCOUNTER — Other Ambulatory Visit: Payer: Self-pay

## 2020-04-28 ENCOUNTER — Encounter (HOSPITAL_COMMUNITY): Payer: Self-pay | Admitting: Emergency Medicine

## 2020-04-28 ENCOUNTER — Inpatient Hospital Stay (HOSPITAL_COMMUNITY)
Admission: EM | Admit: 2020-04-28 | Discharge: 2020-04-30 | DRG: 871 | Disposition: A | Discharge status: Home / Self Care | Payer: Medicare Other | Attending: Pulmonary Disease | Admitting: Pulmonary Disease

## 2020-04-28 ENCOUNTER — Ambulatory Visit (INDEPENDENT_AMBULATORY_CARE_PROVIDER_SITE_OTHER)
Admission: EM | Admit: 2020-04-28 | Discharge: 2020-04-28 | Disposition: A | Discharge status: Other Acute Inpatient Hospital | Payer: Medicare Other | Source: Home / Self Care

## 2020-04-28 ENCOUNTER — Encounter (HOSPITAL_COMMUNITY): Payer: Self-pay

## 2020-04-28 DIAGNOSIS — G9341 Metabolic encephalopathy: Secondary | ICD-10-CM | POA: Diagnosis present

## 2020-04-28 DIAGNOSIS — A4159 Other Gram-negative sepsis: Secondary | ICD-10-CM | POA: Diagnosis not present

## 2020-04-28 DIAGNOSIS — K750 Abscess of liver: Secondary | ICD-10-CM | POA: Diagnosis present

## 2020-04-28 DIAGNOSIS — A419 Sepsis, unspecified organism: Secondary | ICD-10-CM | POA: Diagnosis present

## 2020-04-28 DIAGNOSIS — Z7982 Long term (current) use of aspirin: Secondary | ICD-10-CM

## 2020-04-28 DIAGNOSIS — N400 Enlarged prostate without lower urinary tract symptoms: Secondary | ICD-10-CM | POA: Diagnosis present

## 2020-04-28 DIAGNOSIS — C240 Malignant neoplasm of extrahepatic bile duct: Secondary | ICD-10-CM

## 2020-04-28 DIAGNOSIS — Z681 Body mass index (BMI) 19 or less, adult: Secondary | ICD-10-CM

## 2020-04-28 DIAGNOSIS — R54 Age-related physical debility: Secondary | ICD-10-CM | POA: Diagnosis present

## 2020-04-28 DIAGNOSIS — J449 Chronic obstructive pulmonary disease, unspecified: Secondary | ICD-10-CM | POA: Diagnosis present

## 2020-04-28 DIAGNOSIS — R6521 Severe sepsis with septic shock: Secondary | ICD-10-CM | POA: Diagnosis present

## 2020-04-28 DIAGNOSIS — R64 Cachexia: Secondary | ICD-10-CM | POA: Diagnosis present

## 2020-04-28 DIAGNOSIS — R109 Unspecified abdominal pain: Secondary | ICD-10-CM

## 2020-04-28 DIAGNOSIS — Z79891 Long term (current) use of opiate analgesic: Secondary | ICD-10-CM

## 2020-04-28 DIAGNOSIS — Z20822 Contact with and (suspected) exposure to covid-19: Secondary | ICD-10-CM | POA: Diagnosis present

## 2020-04-28 DIAGNOSIS — I10 Essential (primary) hypertension: Secondary | ICD-10-CM | POA: Diagnosis present

## 2020-04-28 DIAGNOSIS — E039 Hypothyroidism, unspecified: Secondary | ICD-10-CM | POA: Diagnosis present

## 2020-04-28 DIAGNOSIS — R1011 Right upper quadrant pain: Secondary | ICD-10-CM

## 2020-04-28 DIAGNOSIS — F1010 Alcohol abuse, uncomplicated: Secondary | ICD-10-CM | POA: Diagnosis present

## 2020-04-28 DIAGNOSIS — Z79890 Hormone replacement therapy (postmenopausal): Secondary | ICD-10-CM

## 2020-04-28 DIAGNOSIS — K810 Acute cholecystitis: Secondary | ICD-10-CM | POA: Diagnosis not present

## 2020-04-28 DIAGNOSIS — D6481 Anemia due to antineoplastic chemotherapy: Secondary | ICD-10-CM | POA: Diagnosis present

## 2020-04-28 DIAGNOSIS — F4310 Post-traumatic stress disorder, unspecified: Secondary | ICD-10-CM | POA: Diagnosis present

## 2020-04-28 DIAGNOSIS — Z87891 Personal history of nicotine dependence: Secondary | ICD-10-CM

## 2020-04-28 DIAGNOSIS — R1010 Upper abdominal pain, unspecified: Secondary | ICD-10-CM

## 2020-04-28 DIAGNOSIS — F319 Bipolar disorder, unspecified: Secondary | ICD-10-CM | POA: Diagnosis present

## 2020-04-28 DIAGNOSIS — C221 Intrahepatic bile duct carcinoma: Secondary | ICD-10-CM | POA: Diagnosis present

## 2020-04-28 DIAGNOSIS — Z8249 Family history of ischemic heart disease and other diseases of the circulatory system: Secondary | ICD-10-CM

## 2020-04-28 DIAGNOSIS — D649 Anemia, unspecified: Secondary | ICD-10-CM

## 2020-04-28 DIAGNOSIS — T451X5A Adverse effect of antineoplastic and immunosuppressive drugs, initial encounter: Secondary | ICD-10-CM | POA: Diagnosis present

## 2020-04-28 DIAGNOSIS — Z79899 Other long term (current) drug therapy: Secondary | ICD-10-CM

## 2020-04-28 DIAGNOSIS — K819 Cholecystitis, unspecified: Secondary | ICD-10-CM | POA: Diagnosis present

## 2020-04-28 HISTORY — DX: Malignant (primary) neoplasm, unspecified: C80.1

## 2020-04-28 LAB — COMPREHENSIVE METABOLIC PANEL
ALT: 110 U/L — ABNORMAL HIGH (ref 0–44)
AST: 105 U/L — ABNORMAL HIGH (ref 15–41)
Albumin: 2.3 g/dL — ABNORMAL LOW (ref 3.5–5.0)
Alkaline Phosphatase: 542 U/L — ABNORMAL HIGH (ref 38–126)
Anion gap: 11 (ref 5–15)
BUN: 17 mg/dL (ref 8–23)
CO2: 24 mmol/L (ref 22–32)
Calcium: 8.8 mg/dL — ABNORMAL LOW (ref 8.9–10.3)
Chloride: 95 mmol/L — ABNORMAL LOW (ref 98–111)
Creatinine, Ser: 0.82 mg/dL (ref 0.61–1.24)
GFR calc Af Amer: >60 mL/min (ref >60)
GFR calc non Af Amer: >60 mL/min (ref >60)
Glucose, Bld: 118 mg/dL — ABNORMAL HIGH (ref 70–99)
Potassium: 3.8 mmol/L (ref 3.5–5.1)
Sodium: 130 mmol/L — ABNORMAL LOW (ref 135–145)
Total Bilirubin: 1.8 mg/dL — ABNORMAL HIGH (ref 0.3–1.2)
Total Protein: 6.5 g/dL (ref 6.5–8.1)

## 2020-04-28 LAB — CBC
HCT: 22.7 % — ABNORMAL LOW (ref 39.0–52.0)
Hemoglobin: 7.7 g/dL — ABNORMAL LOW (ref 13.0–17.0)
MCH: 23.2 pg — ABNORMAL LOW (ref 26.0–34.0)
MCHC: 33.9 g/dL (ref 30.0–36.0)
MCV: 68.4 fL — ABNORMAL LOW (ref 80.0–100.0)
Platelets: 245 10*3/uL (ref 150–400)
RBC: 3.32 MIL/uL — ABNORMAL LOW (ref 4.22–5.81)
RDW: 13.7 % (ref 11.5–15.5)
WBC: 12.5 10*3/uL — ABNORMAL HIGH (ref 4.0–10.5)
nRBC: 0 % (ref 0.0–0.2)

## 2020-04-28 LAB — POCT URINALYSIS DIP (DEVICE)
Glucose, UA: NEGATIVE mg/dL
Hgb urine dipstick: NEGATIVE
Ketones, ur: NEGATIVE mg/dL
Leukocytes,Ua: NEGATIVE
Nitrite: NEGATIVE
Protein, ur: 30 mg/dL — AB
Specific Gravity, Urine: 1.015 (ref 1.005–1.030)
Urobilinogen, UA: 4 mg/dL — ABNORMAL HIGH (ref 0.0–1.0)
pH: 5.5 (ref 5.0–8.0)

## 2020-04-28 LAB — LIPASE, BLOOD: Lipase: 23 U/L (ref 11–51)

## 2020-04-28 MED ORDER — SODIUM CHLORIDE 0.9% FLUSH
3.0000 mL | Freq: Once | INTRAVENOUS | Status: AC
  Administered 2020-04-28: 3 mL via INTRAVENOUS

## 2020-04-28 MED ORDER — HYDROMORPHONE HCL 1 MG/ML IJ SOLN
1.0000 mg | Freq: Once | INTRAMUSCULAR | Status: AC
  Administered 2020-04-28: 1 mg via INTRAVENOUS
  Filled 2020-04-28: qty 1

## 2020-04-28 MED ORDER — SODIUM CHLORIDE 0.9 % IV SOLN
2.0000 g | Freq: Once | INTRAVENOUS | Status: AC
  Administered 2020-04-29: 2 g via INTRAVENOUS
  Filled 2020-04-28: qty 2

## 2020-04-28 MED ORDER — ONDANSETRON HCL 4 MG/2ML IJ SOLN
4.0000 mg | Freq: Once | INTRAMUSCULAR | Status: AC
  Administered 2020-04-28: 4 mg via INTRAVENOUS
  Filled 2020-04-28: qty 2

## 2020-04-28 MED ORDER — LACTATED RINGERS IV BOLUS (SEPSIS)
250.0000 mL | Freq: Once | INTRAVENOUS | Status: AC
  Administered 2020-04-29: 250 mL via INTRAVENOUS

## 2020-04-28 MED ORDER — SODIUM CHLORIDE 0.9 % IV SOLN: INTRAVENOUS | Status: DC

## 2020-04-28 MED ORDER — IOHEXOL 300 MG/ML  SOLN
100.0000 mL | Freq: Once | INTRAMUSCULAR | Status: AC | PRN
  Administered 2020-04-28: 100 mL via INTRAVENOUS

## 2020-04-28 MED ORDER — HYDROCODONE-ACETAMINOPHEN 5-325 MG PO TABS
ORAL_TABLET | ORAL | Status: AC
  Filled 2020-04-28: qty 1

## 2020-04-28 MED ORDER — METRONIDAZOLE IN NACL 5-0.79 MG/ML-% IV SOLN
500.0000 mg | Freq: Once | INTRAVENOUS | Status: AC
  Administered 2020-04-29: 500 mg via INTRAVENOUS
  Filled 2020-04-28: qty 100

## 2020-04-28 MED ORDER — LACTATED RINGERS IV BOLUS (SEPSIS)
500.0000 mL | Freq: Once | INTRAVENOUS | Status: AC
  Administered 2020-04-29: 500 mL via INTRAVENOUS

## 2020-04-28 MED ORDER — SODIUM CHLORIDE 0.9 % IV BOLUS
500.0000 mL | Freq: Once | INTRAVENOUS | Status: AC
  Administered 2020-04-28: 500 mL via INTRAVENOUS

## 2020-04-28 MED ORDER — HYDROCODONE-ACETAMINOPHEN 5-325 MG PO TABS
1.0000 | ORAL_TABLET | Freq: Once | ORAL | Status: AC
  Administered 2020-04-28: 1 via ORAL

## 2020-04-28 MED ORDER — LACTATED RINGERS IV BOLUS (SEPSIS)
1000.0000 mL | Freq: Once | INTRAVENOUS | Status: AC
  Administered 2020-04-29: 1000 mL via INTRAVENOUS

## 2020-04-28 NOTE — ED Provider Notes (Signed)
[name]   MRN: [id number] DOB: 09-05-1946  Subjective:   Rodney Romero. is a 74 y.o. male presenting for 1 day history of acute onset recurrent upper abdominal pain.  Symptoms started after he had chemotherapy for the first time yesterday.  He is currently undergoing treatment for hilar cholangiocarcinoma.  It is unclear if the patient is taking his oxycodone pain medication.  He has also had nausea and decreased appetite.  His oncologist is Dr. Dorene Grebe with [facility].   No current facility-administered medications for this encounter.  Current Outpatient Medications:  .  amLODipine (NORVASC) 10 MG tablet, Take 10 mg by mouth daily., Disp: , Rfl:  .  amoxicillin-clavulanate (AUGMENTIN) 875-125 MG tablet, Take 1 tablet by mouth every 12 (twelve) hours., Disp: 10 tablet, Rfl: 0 .  aspirin EC 81 MG tablet, Take 81 mg by mouth daily., Disp: , Rfl:  .  fluticasone (FLONASE) 50 MCG/ACT nasal spray, Place 1 spray into both nostrils daily., Disp: , Rfl:  .  HYDROcodone-acetaminophen (NORCO/VICODIN) 5-325 MG tablet, Take 1-2 tablets by mouth every 6 (six) hours as needed for moderate pain or severe pain., Disp: 20 tablet, Rfl: 0 .  levothyroxine (SYNTHROID) 150 MCG tablet, Take 150 mcg by mouth daily before breakfast., Disp: , Rfl:  .  loratadine (CLARITIN) 10 MG tablet, Take 10 mg by mouth daily., Disp: , Rfl:  .  mirtazapine (REMERON) 15 MG tablet, Take 1 tablet (15 mg total) by mouth at bedtime., Disp: , Rfl:  .  Multiple Vitamins-Minerals (MULTIVITAMIN WITH MINERALS) tablet, Take 1 tablet by mouth daily., Disp: , Rfl:  .  omeprazole (PRILOSEC) 20 MG capsule, Take 20 mg by mouth daily., Disp: , Rfl:  .  polyethylene glycol powder (MIRALAX) 17 GM/SCOOP powder, Take 17 g by mouth 2 (two) times daily as needed for moderate constipation., Disp: 255 g, Rfl: 2 .  senna-docusate (SENOKOT-S) 8.6-50 MG tablet, Take 1 tablet by mouth 2 (two) times daily between meals as  needed for mild constipation., Disp: 180 tablet, Rfl: 0   No Known Allergies  Past Medical History:  Diagnosis Date  . Bipolar 1 disorder ([place])   . Cancer ([place])   . Hypertension   . Hypothyroidism   . PTSD (post-traumatic stress disorder)      Past Surgical History:  Procedure Laterality Date  . ERCP N/A 02/05/2020   Procedure: ENDOSCOPIC RETROGRADE CHOLANGIOPANCREATOGRAPHY (ERCP);  Surgeon: Clarene Essex, MD;  Location: [place];  Service: Endoscopy;  Laterality: N/A;  . GALLBLADDER SURGERY    . IR ENDOLUMINAL BX OF BILIARY TREE  02/06/2020  . IR EXCHANGE BILIARY DRAIN  03/10/2020  . IR INT EXT BILIARY DRAIN WITH CHOLANGIOGRAM  02/06/2020  . SPHINCTEROTOMY  02/05/2020   Procedure: SPHINCTEROTOMY;  Surgeon: Clarene Essex, MD;  Location: [facility] ENDOSCOPY;  Service: Endoscopy;;  . TIBIA IM NAIL INSERTION Left 06/26/2018   Procedure: left tibia intramedullary nail;  Surgeon: Gaynelle Arabian, MD;  Location: [facility];  Service: Orthopedics;  Laterality: Left;    Family History  Problem Relation Age of Onset  . Hypertension Mother   . Diabetes Mother   . Seizures Father     Social History   Tobacco Use  . Smoking status: Former Smoker    Packs/day: 0.50    Types: Cigarettes    Quit date: 09/03/2014    Years since quitting: 5.6  . Smokeless tobacco: Never Used  Substance Use Topics  . Alcohol use: Not Currently  . Drug  use: No    ROS   Objective:   Vitals: BP (!) 119/54   Pulse 99   Temp 98.5 F (36.9 C) (Oral)   Resp 16   Ht 5\' 7"  (1.702 m)   Wt 114 lb (51.7 kg)   SpO2 97%   BMI 17.85 kg/m   Physical Exam Constitutional:      General: He is in acute distress.     Appearance: Normal appearance. He is well-developed. He is not ill-appearing, toxic-appearing or diaphoretic.  HENT:     Head: Normocephalic and atraumatic.     Right Ear: External ear normal.     Left Ear: External ear normal.     Nose: Nose normal.     Mouth/Throat:     Mouth: Mucous membranes are moist.       Pharynx: Oropharynx is clear.  Eyes:     General: No scleral icterus.    Extraocular Movements: Extraocular movements intact.     Pupils: Pupils are equal, round, and reactive to light.  Cardiovascular:     Rate and Rhythm: Normal rate and regular rhythm.     Heart sounds: Normal heart sounds. No murmur. No friction rub. No gallop.   Pulmonary:     Effort: Pulmonary effort is normal. No respiratory distress.     Breath sounds: Normal breath sounds. No stridor. No wheezing, rhonchi or rales.  Abdominal:     Tenderness: There is abdominal tenderness in the right upper quadrant, epigastric area and left upper quadrant. There is guarding. There is no right CVA tenderness, left CVA tenderness or rebound.  Neurological:     Mental Status: He is alert and oriented to person, place, and time.  Psychiatric:        Mood and Affect: Mood normal.        Behavior: Behavior normal.        Thought Content: Thought content normal.      Assessment and Plan :   PDMP not reviewed this encounter.  1. Pain of upper abdomen   2. Hilar cholangiocarcinoma ([place])     Discussed case with Dr. Dorene Grebe. There are concerns for biliary obstruction given his medical history. Patient is in need of a higher level of work up including cmet, lipase and most importantly a CT scan to help rule the aforementioned out. Patient and his wife advised to report to the ER now for further work up.    Jaynee Eagles, PA-C 04/28/20 1459

## 2020-04-28 NOTE — ED Triage Notes (Signed)
Patient arrives to ED with complaints of upper abdominal pain since yesterday after his first chemotherapy treatment for hilar cholangiocarcinoma. Patient denies N/V/D.

## 2020-04-28 NOTE — ED Triage Notes (Signed)
Pt c/o 10/10 sharp pain in LUQ, RUQ of abdomen started yesterday. PT had chemo Tuesday at [facility]. and was told by the dr to come get checked out for the abdominal pain. Pt denies N/V/D.

## 2020-04-28 NOTE — ED Provider Notes (Signed)
[facility] EMERGENCY DEPARTMENT Provider Note   CSN: [id number] Arrival date & time: 04/28/20  1534     History Chief Complaint  Patient presents with  . Abdominal Pain    Rodney Romero. is a 74 y.o. male.  Patient sent over from urgent care.  Patient known to have cholangiocarcinoma receives chemotherapy at [facility] as per hematology oncology is.  Patient mostly complained of upper abdominal pain.  That is been worse since yesterday.  No nausea vomiting or diarrhea no fevers.  Urgent care discussed it with Dr. Larey Brick his hematologist at [facility] and they were concerned about the possible biliary obstruction so he sent for CT scan.  Patient also given some pain medicine here.        Past Medical History:  Diagnosis Date  . Bipolar 1 disorder ([place])   . Cancer ([place])   . Hypertension   . Hypothyroidism   . PTSD (post-traumatic stress disorder)     Patient Active Problem List   Diagnosis Date Noted  . Common bile duct (CBD) obstruction 03/20/2020  . Hypothyroidism 03/20/2020  . GERD without esophagitis 03/20/2020  . Dehydration with hyponatremia 03/20/2020  . AKI (acute kidney injury) ([place]) 03/20/2020  . Cholecystitis, acute   . Essential hypertension 02/02/2020  . Closed tibia fracture 06/25/2018    Past Surgical History:  Procedure Laterality Date  . ERCP N/A 02/05/2020   Procedure: ENDOSCOPIC RETROGRADE CHOLANGIOPANCREATOGRAPHY (ERCP);  Surgeon: Clarene Essex, MD;  Location: [place];  Service: Endoscopy;  Laterality: N/A;  . GALLBLADDER SURGERY    . IR ENDOLUMINAL BX OF BILIARY TREE  02/06/2020  . IR EXCHANGE BILIARY DRAIN  03/10/2020  . IR INT EXT BILIARY DRAIN WITH CHOLANGIOGRAM  02/06/2020  . SPHINCTEROTOMY  02/05/2020   Procedure: SPHINCTEROTOMY;  Surgeon: Clarene Essex, MD;  Location: [facility] ENDOSCOPY;  Service: Endoscopy;;  . TIBIA IM NAIL INSERTION Left 06/26/2018   Procedure: left tibia intramedullary nail;  Surgeon: Gaynelle Arabian, MD;  Location:  [facility];  Service: Orthopedics;  Laterality: Left;       Family History  Problem Relation Age of Onset  . Hypertension Mother   . Diabetes Mother   . Seizures Father     Social History   Tobacco Use  . Smoking status: Former Smoker    Packs/day: 0.50    Types: Cigarettes    Quit date: 09/03/2014    Years since quitting: 5.6  . Smokeless tobacco: Never Used  Substance Use Topics  . Alcohol use: Not Currently  . Drug use: No    Home Medications Prior to Admission medications   Medication Sig Start Date End Date Taking? Authorizing Provider  amLODipine (NORVASC) 10 MG tablet Take 10 mg by mouth daily.    [provider]  amoxicillin-clavulanate (AUGMENTIN) 875-125 MG tablet Take 1 tablet by mouth every 12 (twelve) hours. 03/22/20   Mercy Riding, MD  aspirin EC 81 MG tablet Take 81 mg by mouth daily.    [provider]  fluticasone (FLONASE) 50 MCG/ACT nasal spray Place 1 spray into both nostrils daily.    [provider]  HYDROcodone-acetaminophen (NORCO/VICODIN) 5-325 MG tablet Take 1-2 tablets by mouth every 6 (six) hours as needed for moderate pain or severe pain. 03/10/20   Monia Sabal, PA-C  levothyroxine (SYNTHROID) 150 MCG tablet Take 150 mcg by mouth daily before breakfast.    [provider]  loratadine (CLARITIN) 10 MG tablet Take 10 mg by mouth daily.  [provider]  mirtazapine (REMERON) 15 MG tablet Take 1 tablet (15 mg total) by mouth at bedtime. 03/22/20   Mercy Riding, MD  Multiple Vitamins-Minerals (MULTIVITAMIN WITH MINERALS) tablet Take 1 tablet by mouth daily.    [provider]  omeprazole (PRILOSEC) 20 MG capsule Take 20 mg by mouth daily.    [provider]  polyethylene glycol powder (MIRALAX) 17 GM/SCOOP powder Take 17 g by mouth 2 (two) times daily as needed for moderate constipation. 03/22/20   Mercy Riding, MD  senna-docusate (SENOKOT-S) 8.6-50 MG tablet Take 1 tablet by mouth 2 (two)  times daily between meals as needed for mild constipation. 03/22/20   Mercy Riding, MD    Allergies    Patient has no known allergies.  Review of Systems   Review of Systems  Constitutional: Negative for chills and fever.  HENT: Negative for rhinorrhea and sore throat.   Eyes: Negative for visual disturbance.  Respiratory: Negative for cough and shortness of breath.   Cardiovascular: Negative for chest pain and leg swelling.  Gastrointestinal: Positive for abdominal pain. Negative for diarrhea, nausea and vomiting.  Genitourinary: Negative for dysuria.  Musculoskeletal: Negative for back pain and neck pain.  Skin: Negative for rash.  Neurological: Negative for dizziness, light-headedness and headaches.  Hematological: Does not bruise/bleed easily.  Psychiatric/Behavioral: Negative for confusion.    Physical Exam Updated Vital Signs BP (!) 115/54   Pulse 94   Temp 99.1 F (37.3 C) (Oral)   Resp 18   SpO2 96%   Physical Exam Vitals and nursing note reviewed.  Constitutional:      Appearance: Normal appearance. He is well-developed.  HENT:     Head: Normocephalic and atraumatic.  Eyes:     General: Scleral icterus present.     Conjunctiva/sclera: Conjunctivae normal.  Cardiovascular:     Rate and Rhythm: Normal rate and regular rhythm.     Heart sounds: No murmur.  Pulmonary:     Effort: Pulmonary effort is normal. No respiratory distress.     Breath sounds: Normal breath sounds.     Comments: Port-A-Cath in the right upper chest area. Abdominal:     Palpations: Abdomen is soft.     Tenderness: There is abdominal tenderness. There is guarding.  Musculoskeletal:        General: No swelling.     Cervical back: Neck supple.  Skin:    General: Skin is warm and dry.     Capillary Refill: Capillary refill takes less than 2 seconds.  Neurological:     General: No focal deficit present.     Mental Status: He is alert and oriented to person, place, and time.     ED  Results / Procedures / Treatments   Labs (all labs ordered are listed, but only abnormal results are displayed) Labs Reviewed  COMPREHENSIVE METABOLIC PANEL - Abnormal; Notable for the following components:      Result Value   Sodium 130 (*)    Chloride 95 (*)    Glucose, Bld 118 (*)    Calcium 8.8 (*)    Albumin 2.3 (*)    AST 105 (*)    ALT 110 (*)    Alkaline Phosphatase 542 (*)    Total Bilirubin 1.8 (*)    All other components within normal limits  CBC - Abnormal; Notable for the following components:   WBC 12.5 (*)    RBC 3.32 (*)    Hemoglobin 7.7 (*)  HCT 22.7 (*)    MCV 68.4 (*)    MCH 23.2 (*)    All other components within normal limits  CULTURE, BLOOD (ROUTINE X 2)  CULTURE, BLOOD (ROUTINE X 2)  SARS CORONAVIRUS 2 BY RT PCR (HOSPITAL ORDER, [place] [facility])  LIPASE, BLOOD  URINALYSIS, ROUTINE W REFLEX MICROSCOPIC  LACTIC ACID, PLASMA  LACTIC ACID, PLASMA  PROTIME-INR    EKG None  Radiology CT Abdomen Pelvis W Contrast  Result Date: 04/28/2020 CLINICAL DATA:  Known cholangiocarcinoma, right upper quadrant pain EXAM: CT ABDOMEN AND PELVIS WITH CONTRAST TECHNIQUE: Multidetector CT imaging of the abdomen and pelvis was performed using the standard protocol following bolus administration of intravenous contrast. CONTRAST:  113mL OMNIPAQUE IOHEXOL 300 MG/ML  SOLN COMPARISON:  MR abdomen 03/20/2020, CT 03/05/2020 FINDINGS: Lower chest: Bibasilar atelectasis with more dense bandlike areas of opacity in the right lung base likely reflecting subsegmental atelectatic changes well. No visible pulmonary nodules mild cardiomegaly. No pericardial effusion. Distal thoracic aortic atherosclerosis. Hepatobiliary: There are multiple targetoid lesions with central hypoattenuation and peripheral rims of more intermediate attenuation throughout the all segments of the right lobe liver. Some associated capsular retraction is seen along the anterior right lobe  (8/11) marked intrahepatic biliary ductal dilatation is noted throughout the right liver as well. This is despite the presence of an internal biliary drain originating in the main hepatic duct in terminating in the duodenum. A prior percutaneous biliary drain no longer continues to the skin surface, terminating in the muscle belly of the superior aspect rectus sheath (8/5). There is a small fluid and gas containing collection measuring approximately 2.1 x 2.3 cm which is contiguous between the muscle belly and hepatic capsule. Catheter seen terminating distally in the common bile duct. Ill-defined hypoattenuation about the drainage catheters and the central right hepatic duct is noted as well (8/16). Much of the left lobe appears unaffected by these acute changes the gallbladder is circumferentially thickened with adjacent pericholecystic hazy stranding. No visible intraductal gallstones. Pancreas: Unremarkable. No pancreatic ductal dilatation or surrounding inflammatory changes. Spleen: Normal in size without focal abnormality. Adrenals/Urinary Tract: Normal adrenal glands. There is symmetric renal enhancement and slightly delayed excretion bilaterally on more delayed phase imaging. Multiple clustered cysts again seen in the lower pole right kidney including several layering milk of calcium cysts. As well as an additional fluid attenuation cyst in the lower pole left kidney all appear grossly unchanged or diminished in size from the comparison exam. No new concerning renal lesion. No urolithiasis or hydronephrosis. There is circumferential bladder wall thickening. Indentation of the bladder base by the enlarged prostate. Stomach/Bowel: Distal esophagus, stomach and duodenal sweep are unremarkable. No small bowel wall thickening or dilatation. No evidence of obstruction. Air-filled appendix in the right lower quadrant. No colonic dilatation or wall thickening. Vascular/Lymphatic: Atherosclerotic calcifications  throughout the abdominal aorta and branch vessels. No aneurysm or ectasia. No enlarged abdominopelvic lymph nodes. Reproductive: Prostatomegaly with enhancing nodule in the periphery near the base of the gland (3/71) indeterminate. Other: Mild circumferential body wall edema. Small volume low-attenuation free fluid in the deep pelvis. No abdominopelvic free air. No bowel containing hernias. Musculoskeletal: Multilevel degenerative changes are present in the imaged portions of the spine. No acute osseous abnormality or suspicious osseous lesion. IMPRESSION: 1. Ill-defined region of hypoattenuation centered upon the central liver and traversed by the multiple drainage catheters compatible with site of primary cholangiocarcinoma. 2. Multiple targetoid lesions throughout the all segments of the right  lobe liver with central hypoattenuation and peripheral rims of more intermediate attenuation. Findings are compatible with metastatic disease. 3. A prior percutaneous biliary drain appears abandoned, no longer continuing to the skin surface, terminating in the muscle belly of the superior aspect of the rectus sheath with a small fluid and air containing collection adjacent the tip contiguous with the superior belly of the rectus muscle and hepatic capsule. Could reflect drainage about catheter tip a an abscess is difficult to exclude. 4. Circumferentially thickened gallbladder wall with adjacent pericholecystic hazy stranding, suspicious for acute cholecystitis. Recommend correlation with right upper quadrant ultrasound. 5. Prostatomegaly with enhancing nodule in the periphery near the base of the gland indeterminate. Recommend correlation with PSA and consider further evaluation with dedicated prostate imaging. 6. Circumferential bladder wall thickening with faint perivesicular hazy stranding as well as delayed excretion of the kidneys, possibly related to chronic outlet obstruction though should correlate with urinalysis  to exclude cystitis and ascending tract infection. 7. Aortic Atherosclerosis (ICD10-I70.0). These results were called by telephone at the time of interpretation on 04/28/2020 at 11:33 pm to provider Fredia Sorrow , who verbally acknowledged these results. Electronically Signed   By: Lovena Le M.D.   On: 04/28/2020 23:34    Procedures Procedures (including critical care time)  CRITICAL CARE Performed by: Fredia Sorrow Total critical care time: 35 minutes Critical care time was exclusive of separately billable procedures and treating other patients. Critical care was necessary to treat or prevent imminent or life-threatening deterioration. Critical care was time spent personally by me on the following activities: development of treatment plan with patient and/or surrogate as well as nursing, discussions with consultants, evaluation of patient's response to treatment, examination of patient, obtaining history from patient or surrogate, ordering and performing treatments and interventions, ordering and review of laboratory studies, ordering and review of radiographic studies, pulse oximetry and re-evaluation of patient's condition.  Medications Ordered in ED Medications  0.9 %  sodium chloride infusion (has no administration in time range)  lactated ringers bolus 1,000 mL (has no administration in time range)    And  lactated ringers bolus 500 mL (has no administration in time range)    And  lactated ringers bolus 250 mL (has no administration in time range)  ceFEPIme (MAXIPIME) 2 g in sodium chloride 0.9 % 100 mL IVPB (has no administration in time range)  metroNIDAZOLE (FLAGYL) IVPB 500 mg (has no administration in time range)  sodium chloride flush (NS) 0.9 % injection 3 mL (3 mLs Intravenous Given 04/28/20 2255)  sodium chloride 0.9 % bolus 500 mL (500 mLs Intravenous New Bag/Given 04/28/20 2255)  ondansetron (ZOFRAN) injection 4 mg (4 mg Intravenous Given 04/28/20 2244)  HYDROmorphone  (DILAUDID) injection 1 mg (1 mg Intravenous Given 04/28/20 2244)  iohexol (OMNIPAQUE) 300 MG/ML solution 100 mL (100 mLs Intravenous Contrast Given 04/28/20 2301)    ED Course  I have reviewed the triage vital signs and the nursing notes.  Pertinent labs & imaging results that were available during my care of the patient were reviewed by me and considered in my medical decision making (see chart for details).    MDM Rules/Calculators/A&P                      Is pain improved here with hydromorphone.  Not clear whether he has any pain medications at home patient says not.  Concern is to evaluate the abdominal pain there is significant tenderness.  Preliminary CT results raise  several concerns.  The gallbladder looks as if it could be infected.  He has 2 indwelling stents.  Biliary stents 1 is not on the proper location there is concern for possible abscess there.  Patient turned over to overnight physician who will contact his hematology oncology team at [facility] probably will need medicine admission may require surgical consultation but patient will need to go [facility]  Patient's initial labs here without any significant fever.  Or hypotension.  But patient probably will need sepsis labs ordered and antibiotics started.   Final Clinical Impression(s) / ED Diagnoses Final diagnoses:  Pain of upper abdomen    Rx / DC Orders ED Discharge Orders    None       Fredia Sorrow, MD 04/28/20 [phone]

## 2020-04-28 NOTE — Discharge Instructions (Addendum)
I have spoken with Dr. Dorene Grebe and we in agreement that a CT scan of abdomen is necessary to help further evaluate whether he may have biliary obstruction given his hilar cholangiocarcinoma. Unfortunately we do not have this at the urgent care and the recommendation is to go to the [facility] emergency room to evaluate for this. Please head there now for more labs, work up.

## 2020-04-29 ENCOUNTER — Encounter (HOSPITAL_COMMUNITY): Payer: Self-pay | Admitting: Internal Medicine

## 2020-04-29 ENCOUNTER — Emergency Department (HOSPITAL_COMMUNITY): Payer: Medicare Other

## 2020-04-29 ENCOUNTER — Inpatient Hospital Stay (HOSPITAL_COMMUNITY): Payer: Medicare Other

## 2020-04-29 DIAGNOSIS — A4159 Other Gram-negative sepsis: Secondary | ICD-10-CM | POA: Diagnosis present

## 2020-04-29 DIAGNOSIS — Z8249 Family history of ischemic heart disease and other diseases of the circulatory system: Secondary | ICD-10-CM | POA: Diagnosis not present

## 2020-04-29 DIAGNOSIS — D649 Anemia, unspecified: Secondary | ICD-10-CM

## 2020-04-29 DIAGNOSIS — Z79899 Other long term (current) drug therapy: Secondary | ICD-10-CM | POA: Diagnosis not present

## 2020-04-29 DIAGNOSIS — F4310 Post-traumatic stress disorder, unspecified: Secondary | ICD-10-CM | POA: Diagnosis present

## 2020-04-29 DIAGNOSIS — Z87891 Personal history of nicotine dependence: Secondary | ICD-10-CM | POA: Diagnosis not present

## 2020-04-29 DIAGNOSIS — A419 Sepsis, unspecified organism: Secondary | ICD-10-CM

## 2020-04-29 DIAGNOSIS — Z681 Body mass index (BMI) 19 or less, adult: Secondary | ICD-10-CM | POA: Diagnosis not present

## 2020-04-29 DIAGNOSIS — Z79891 Long term (current) use of opiate analgesic: Secondary | ICD-10-CM | POA: Diagnosis not present

## 2020-04-29 DIAGNOSIS — J449 Chronic obstructive pulmonary disease, unspecified: Secondary | ICD-10-CM | POA: Diagnosis present

## 2020-04-29 DIAGNOSIS — G9341 Metabolic encephalopathy: Secondary | ICD-10-CM | POA: Diagnosis present

## 2020-04-29 DIAGNOSIS — R64 Cachexia: Secondary | ICD-10-CM | POA: Diagnosis present

## 2020-04-29 DIAGNOSIS — R6521 Severe sepsis with septic shock: Secondary | ICD-10-CM | POA: Diagnosis present

## 2020-04-29 DIAGNOSIS — K810 Acute cholecystitis: Secondary | ICD-10-CM

## 2020-04-29 DIAGNOSIS — K819 Cholecystitis, unspecified: Secondary | ICD-10-CM | POA: Diagnosis present

## 2020-04-29 DIAGNOSIS — K750 Abscess of liver: Secondary | ICD-10-CM | POA: Diagnosis present

## 2020-04-29 DIAGNOSIS — I10 Essential (primary) hypertension: Secondary | ICD-10-CM | POA: Diagnosis present

## 2020-04-29 DIAGNOSIS — Z20822 Contact with and (suspected) exposure to covid-19: Secondary | ICD-10-CM | POA: Diagnosis present

## 2020-04-29 DIAGNOSIS — C221 Intrahepatic bile duct carcinoma: Secondary | ICD-10-CM | POA: Diagnosis present

## 2020-04-29 DIAGNOSIS — N400 Enlarged prostate without lower urinary tract symptoms: Secondary | ICD-10-CM | POA: Diagnosis present

## 2020-04-29 DIAGNOSIS — E039 Hypothyroidism, unspecified: Secondary | ICD-10-CM | POA: Diagnosis present

## 2020-04-29 DIAGNOSIS — Z7982 Long term (current) use of aspirin: Secondary | ICD-10-CM | POA: Diagnosis not present

## 2020-04-29 DIAGNOSIS — F1010 Alcohol abuse, uncomplicated: Secondary | ICD-10-CM | POA: Diagnosis present

## 2020-04-29 DIAGNOSIS — R54 Age-related physical debility: Secondary | ICD-10-CM | POA: Diagnosis present

## 2020-04-29 DIAGNOSIS — T451X5A Adverse effect of antineoplastic and immunosuppressive drugs, initial encounter: Secondary | ICD-10-CM | POA: Diagnosis present

## 2020-04-29 DIAGNOSIS — F319 Bipolar disorder, unspecified: Secondary | ICD-10-CM | POA: Diagnosis present

## 2020-04-29 DIAGNOSIS — I959 Hypotension, unspecified: Secondary | ICD-10-CM

## 2020-04-29 HISTORY — PX: IR PERC CHOLECYSTOSTOMY: IMG2326

## 2020-04-29 HISTORY — PX: IR INT EXT BILIARY DRAIN WITH CHOLANGIOGRAM: IMG6044

## 2020-04-29 LAB — FOLATE: Folate: 11.8 ng/mL (ref >5.9)

## 2020-04-29 LAB — CBC WITH DIFFERENTIAL/PLATELET
Abs Immature Granulocytes: 0.88 10*3/uL — ABNORMAL HIGH (ref 0.00–0.07)
Abs Immature Granulocytes: 0 10*3/uL (ref 0.00–0.07)
Basophils Absolute: 0 10*3/uL (ref 0.0–0.1)
Basophils Absolute: 0 10*3/uL (ref 0.0–0.1)
Basophils Relative: 0 %
Basophils Relative: 0 %
Eosinophils Absolute: 0 10*3/uL (ref 0.0–0.5)
Eosinophils Absolute: 0 10*3/uL (ref 0.0–0.5)
Eosinophils Relative: 0 %
Eosinophils Relative: 0 %
HCT: 20.9 % — ABNORMAL LOW (ref 39.0–52.0)
HCT: 16.7 % — ABNORMAL LOW (ref 39.0–52.0)
Hemoglobin: 7.1 g/dL — ABNORMAL LOW (ref 13.0–17.0)
Hemoglobin: 5.7 g/dL — CL (ref 13.0–17.0)
Immature Granulocytes: 8 %
Lymphocytes Relative: 4 %
Lymphocytes Relative: 9 %
Lymphs Abs: 0.4 10*3/uL — ABNORMAL LOW (ref 0.7–4.0)
Lymphs Abs: 0.5 10*3/uL — ABNORMAL LOW (ref 0.7–4.0)
MCH: 23.5 pg — ABNORMAL LOW (ref 26.0–34.0)
MCH: 23.6 pg — ABNORMAL LOW (ref 26.0–34.0)
MCHC: 34 g/dL (ref 30.0–36.0)
MCHC: 34.1 g/dL (ref 30.0–36.0)
MCV: 69.2 fL — ABNORMAL LOW (ref 80.0–100.0)
MCV: 69 fL — ABNORMAL LOW (ref 80.0–100.0)
Monocytes Absolute: 0 10*3/uL — ABNORMAL LOW (ref 0.1–1.0)
Monocytes Absolute: 0 10*3/uL — ABNORMAL LOW (ref 0.1–1.0)
Monocytes Relative: 0 %
Monocytes Relative: 0 %
Neutro Abs: 9.2 10*3/uL — ABNORMAL HIGH (ref 1.7–7.7)
Neutro Abs: 5.3 10*3/uL (ref 1.7–7.7)
Neutrophils Relative %: 88 %
Neutrophils Relative %: 91 %
Platelets: 195 10*3/uL (ref 150–400)
Platelets: 146 10*3/uL — ABNORMAL LOW (ref 150–400)
RBC: 3.02 MIL/uL — ABNORMAL LOW (ref 4.22–5.81)
RBC: 2.42 MIL/uL — ABNORMAL LOW (ref 4.22–5.81)
RDW: 13.8 % (ref 11.5–15.5)
RDW: 13.6 % (ref 11.5–15.5)
WBC: 10.5 10*3/uL (ref 4.0–10.5)
WBC: 5.8 10*3/uL (ref 4.0–10.5)
nRBC: 0 % (ref 0.0–0.2)
nRBC: 0 % (ref 0.0–0.2)
nRBC: 0 /100 WBC

## 2020-04-29 LAB — LACTIC ACID, PLASMA
Lactic Acid, Venous: 1.3 mmol/L (ref 0.5–1.9)
Lactic Acid, Venous: 1.8 mmol/L (ref 0.5–1.9)

## 2020-04-29 LAB — BLOOD CULTURE ID PANEL (REFLEXED)

## 2020-04-29 LAB — RETICULOCYTES
Immature Retic Fract: 4.3 % (ref 2.3–15.9)
RBC.: 2.43 MIL/uL — ABNORMAL LOW (ref 4.22–5.81)
Retic Count, Absolute: 11.2 10*3/uL — ABNORMAL LOW (ref 19.0–186.0)
Retic Ct Pct: 0.5 % (ref 0.4–3.1)

## 2020-04-29 LAB — BASIC METABOLIC PANEL
Anion gap: 9 (ref 5–15)
BUN: 16 mg/dL (ref 8–23)
CO2: 24 mmol/L (ref 22–32)
Calcium: 8.4 mg/dL — ABNORMAL LOW (ref 8.9–10.3)
Chloride: 99 mmol/L (ref 98–111)
Creatinine, Ser: 0.97 mg/dL (ref 0.61–1.24)
GFR calc Af Amer: >60 mL/min (ref >60)
GFR calc non Af Amer: >60 mL/min (ref >60)
Glucose, Bld: 117 mg/dL — ABNORMAL HIGH (ref 70–99)
Potassium: 3.8 mmol/L (ref 3.5–5.1)
Sodium: 132 mmol/L — ABNORMAL LOW (ref 135–145)

## 2020-04-29 LAB — HEPATIC FUNCTION PANEL
ALT: 151 U/L — ABNORMAL HIGH (ref 0–44)
AST: 189 U/L — ABNORMAL HIGH (ref 15–41)
Albumin: 2 g/dL — ABNORMAL LOW (ref 3.5–5.0)
Alkaline Phosphatase: 442 U/L — ABNORMAL HIGH (ref 38–126)
Bilirubin, Direct: 1.1 mg/dL — ABNORMAL HIGH (ref 0.0–0.2)
Indirect Bilirubin: 0.6 mg/dL (ref 0.3–0.9)
Total Bilirubin: 1.7 mg/dL — ABNORMAL HIGH (ref 0.3–1.2)
Total Protein: 5.9 g/dL — ABNORMAL LOW (ref 6.5–8.1)

## 2020-04-29 LAB — CBG MONITORING, ED
Glucose-Capillary: 82 mg/dL (ref 70–99)
Glucose-Capillary: 63 mg/dL — ABNORMAL LOW (ref 70–99)
Glucose-Capillary: 154 mg/dL — ABNORMAL HIGH (ref 70–99)
Glucose-Capillary: 86 mg/dL (ref 70–99)
Glucose-Capillary: 76 mg/dL (ref 70–99)
Glucose-Capillary: 88 mg/dL (ref 70–99)

## 2020-04-29 LAB — GLUCOSE, CAPILLARY: Glucose-Capillary: 87 mg/dL (ref 70–99)

## 2020-04-29 LAB — URINALYSIS, ROUTINE W REFLEX MICROSCOPIC
Bacteria, UA: NONE SEEN
Bilirubin Urine: NEGATIVE
Glucose, UA: NEGATIVE mg/dL
Hgb urine dipstick: NEGATIVE
Ketones, ur: NEGATIVE mg/dL
Leukocytes,Ua: NEGATIVE
Nitrite: NEGATIVE
Protein, ur: 30 mg/dL — AB
Specific Gravity, Urine: 1.016 (ref 1.005–1.030)
pH: 5 (ref 5.0–8.0)

## 2020-04-29 LAB — PROTIME-INR
INR: 1.3 — ABNORMAL HIGH (ref 0.8–1.2)
Prothrombin Time: 15.8 seconds — ABNORMAL HIGH (ref 11.4–15.2)

## 2020-04-29 LAB — HEMOGLOBIN AND HEMATOCRIT, BLOOD
HCT: 27.2 % — ABNORMAL LOW (ref 39.0–52.0)
Hemoglobin: 9.2 g/dL — ABNORMAL LOW (ref 13.0–17.0)

## 2020-04-29 LAB — SARS CORONAVIRUS 2 BY RT PCR (HOSPITAL ORDER, PERFORMED IN ~~LOC~~ HOSPITAL LAB): SARS Coronavirus 2: NEGATIVE

## 2020-04-29 LAB — FERRITIN: Ferritin: 1474 ng/mL — ABNORMAL HIGH (ref 24–336)

## 2020-04-29 LAB — IRON AND TIBC
Iron: 12 ug/dL — ABNORMAL LOW (ref 45–182)
Saturation Ratios: 10 % — ABNORMAL LOW (ref 17.9–39.5)
TIBC: 122 ug/dL — ABNORMAL LOW (ref 250–450)
UIBC: 110 ug/dL

## 2020-04-29 LAB — VITAMIN B12: Vitamin B-12: 1121 pg/mL — ABNORMAL HIGH (ref 180–914)

## 2020-04-29 LAB — PREPARE RBC (CROSSMATCH)

## 2020-04-29 MED ORDER — FENTANYL CITRATE (PF) 100 MCG/2ML IJ SOLN
INTRAMUSCULAR | Status: AC | PRN
  Administered 2020-04-29 (×3): 12.5 ug via INTRAVENOUS

## 2020-04-29 MED ORDER — VANCOMYCIN HCL 500 MG/100ML IV SOLN: 500.0000 mg | Freq: Two times a day (BID) | INTRAVENOUS | Status: DC

## 2020-04-29 MED ORDER — FUROSEMIDE 10 MG/ML IJ SOLN
20.0000 mg | Freq: Once | INTRAMUSCULAR | Status: AC
  Administered 2020-04-29: 20 mg via INTRAVENOUS
  Filled 2020-04-29: qty 2

## 2020-04-29 MED ORDER — SODIUM CHLORIDE 0.9 % IV SOLN
250.0000 mL | INTRAVENOUS | Status: DC
  Administered 2020-04-29: 250 mL via INTRAVENOUS

## 2020-04-29 MED ORDER — VANCOMYCIN HCL IN DEXTROSE 1-5 GM/200ML-% IV SOLN
1000.0000 mg | Freq: Once | INTRAVENOUS | Status: AC
  Administered 2020-04-29: 1000 mg via INTRAVENOUS
  Filled 2020-04-29: qty 200

## 2020-04-29 MED ORDER — LACTATED RINGERS IV SOLN: INTRAVENOUS | Status: AC

## 2020-04-29 MED ORDER — PANTOPRAZOLE SODIUM 40 MG IV SOLR
40.0000 mg | Freq: Two times a day (BID) | INTRAVENOUS | Status: DC
  Administered 2020-04-29 (×2): 40 mg via INTRAVENOUS
  Filled 2020-04-29 (×2): qty 40

## 2020-04-29 MED ORDER — SODIUM CHLORIDE 0.9 % IV BOLUS
1000.0000 mL | Freq: Once | INTRAVENOUS | Status: AC
  Administered 2020-04-29: 1000 mL via INTRAVENOUS

## 2020-04-29 MED ORDER — DEXTROSE-NACL 5-0.9 % IV SOLN: INTRAVENOUS | Status: DC

## 2020-04-29 MED ORDER — FENTANYL CITRATE (PF) 100 MCG/2ML IJ SOLN
INTRAMUSCULAR | Status: AC
  Filled 2020-04-29: qty 2

## 2020-04-29 MED ORDER — ONDANSETRON HCL 4 MG PO TABS: 4.0000 mg | ORAL_TABLET | Freq: Four times a day (QID) | ORAL | Status: DC | PRN

## 2020-04-29 MED ORDER — DIPHENHYDRAMINE HCL 25 MG PO CAPS: 25.0000 mg | ORAL_CAPSULE | Freq: Once | ORAL | Status: DC

## 2020-04-29 MED ORDER — SODIUM CHLORIDE 0.9% IV SOLUTION: Freq: Once | INTRAVENOUS | Status: AC

## 2020-04-29 MED ORDER — DIPHENHYDRAMINE HCL 50 MG/ML IJ SOLN
25.0000 mg | Freq: Once | INTRAMUSCULAR | Status: DC
  Filled 2020-04-29: qty 1

## 2020-04-29 MED ORDER — LEVOFLOXACIN IN D5W 500 MG/100ML IV SOLN
500.0000 mg | Freq: Once | INTRAVENOUS | Status: AC
  Administered 2020-04-29: 500 mg via INTRAVENOUS
  Filled 2020-04-29: qty 100

## 2020-04-29 MED ORDER — DEXTROSE-NACL 5-0.9 % IV SOLN: INTRAVENOUS | Status: AC

## 2020-04-29 MED ORDER — MIDAZOLAM HCL 2 MG/2ML IJ SOLN
INTRAMUSCULAR | Status: AC | PRN
  Administered 2020-04-29: 0.5 mg via INTRAVENOUS

## 2020-04-29 MED ORDER — HEPARIN SODIUM (PORCINE) 5000 UNIT/ML IJ SOLN
5000.0000 [IU] | Freq: Three times a day (TID) | INTRAMUSCULAR | Status: DC
  Administered 2020-04-29: 5000 [IU] via SUBCUTANEOUS
  Filled 2020-04-29: qty 1

## 2020-04-29 MED ORDER — DEXTROSE 50 % IV SOLN
1.0000 | Freq: Once | INTRAVENOUS | Status: AC
  Administered 2020-04-29: 50 mL via INTRAVENOUS
  Filled 2020-04-29: qty 50

## 2020-04-29 MED ORDER — SODIUM CHLORIDE 0.9% FLUSH
5.0000 mL | Freq: Three times a day (TID) | INTRAVENOUS | Status: DC
  Administered 2020-04-29: 5 mL

## 2020-04-29 MED ORDER — SODIUM CHLORIDE 0.9 % IV SOLN: INTRAVENOUS | Status: DC

## 2020-04-29 MED ORDER — LEVOTHYROXINE SODIUM 100 MCG/5ML IV SOLN
75.0000 ug | Freq: Every day | INTRAVENOUS | Status: DC
  Administered 2020-04-29: 75 ug via INTRAVENOUS
  Filled 2020-04-29 (×2): qty 5

## 2020-04-29 MED ORDER — IOHEXOL 300 MG/ML  SOLN
50.0000 mL | Freq: Once | INTRAMUSCULAR | Status: AC | PRN
  Administered 2020-04-29: 30 mL

## 2020-04-29 MED ORDER — NALOXONE HCL 0.4 MG/ML IJ SOLN
INTRAMUSCULAR | Status: AC
  Filled 2020-04-29: qty 1

## 2020-04-29 MED ORDER — ONDANSETRON HCL 4 MG/2ML IJ SOLN: 4.0000 mg | Freq: Four times a day (QID) | INTRAMUSCULAR | Status: DC | PRN

## 2020-04-29 MED ORDER — NOREPINEPHRINE 4 MG/250ML-% IV SOLN: 0.0000 ug/min | INTRAVENOUS | Status: DC

## 2020-04-29 MED ORDER — MIDAZOLAM HCL 2 MG/2ML IJ SOLN
INTRAMUSCULAR | Status: AC
  Filled 2020-04-29: qty 2

## 2020-04-29 MED ORDER — LIDOCAINE HCL 1 % IJ SOLN
INTRAMUSCULAR | Status: AC
  Filled 2020-04-29: qty 20

## 2020-04-29 MED ORDER — HYDRALAZINE HCL 20 MG/ML IJ SOLN: 10.0000 mg | INTRAMUSCULAR | Status: DC | PRN

## 2020-04-29 MED ORDER — LIDOCAINE HCL 1 % IJ SOLN
INTRAMUSCULAR | Status: AC | PRN
  Administered 2020-04-29: 15 mL

## 2020-04-29 MED ORDER — NALOXONE HCL 0.4 MG/ML IJ SOLN
0.4000 mg | Freq: Once | INTRAMUSCULAR | Status: AC
  Administered 2020-04-29: 0.4 mg via INTRAVENOUS

## 2020-04-29 MED ORDER — SODIUM CHLORIDE 0.9 % IV SOLN
2.0000 g | INTRAVENOUS | Status: DC
  Administered 2020-04-29: 2 g via INTRAVENOUS
  Filled 2020-04-29: qty 20

## 2020-04-29 MED ORDER — HYDROMORPHONE HCL 1 MG/ML IJ SOLN
1.0000 mg | Freq: Once | INTRAMUSCULAR | Status: AC
  Administered 2020-04-29: 1 mg via INTRAVENOUS
  Filled 2020-04-29: qty 1

## 2020-04-29 MED ORDER — NOREPINEPHRINE 4 MG/250ML-% IV SOLN
2.0000 ug/min | INTRAVENOUS | Status: DC
  Administered 2020-04-29: 2 ug/min via INTRAVENOUS
  Filled 2020-04-29: qty 250

## 2020-04-29 MED ORDER — METRONIDAZOLE IN NACL 5-0.79 MG/ML-% IV SOLN
500.0000 mg | Freq: Three times a day (TID) | INTRAVENOUS | Status: DC
  Administered 2020-04-29 (×3): 500 mg via INTRAVENOUS
  Filled 2020-04-29 (×3): qty 100

## 2020-04-29 MED ORDER — ACETAMINOPHEN 650 MG RE SUPP
650.0000 mg | Freq: Once | RECTAL | Status: AC
  Administered 2020-04-29: 650 mg via RECTAL
  Filled 2020-04-29: qty 1

## 2020-04-29 MED ORDER — SODIUM CHLORIDE 0.9 % IV SOLN: 2.0000 g | Freq: Two times a day (BID) | INTRAVENOUS | Status: DC

## 2020-04-29 MED ORDER — LACTATED RINGERS IV BOLUS
1000.0000 mL | Freq: Once | INTRAVENOUS | Status: AC
  Administered 2020-04-29: 1000 mL via INTRAVENOUS

## 2020-04-29 MED ORDER — ACETAMINOPHEN 325 MG PO TABS: 650.0000 mg | ORAL_TABLET | Freq: Once | ORAL | Status: DC

## 2020-04-29 NOTE — ED Notes (Signed)
Pt has had increase in BP following Narcan and increased fluids briefly.   Dr. Grandville Silos has been contacted by previous RN and Dr. Sabra Heck ED MD has evaluated and ordered the Narcan.  No gross changes in mentation following Narcan.  Remains confused but restless.  IV patent and infusing.  Pt copious incontinence of urine and stool, peri care and linens changed.  Condom cath applied.  Will keep NPO d/t mentation.

## 2020-04-29 NOTE — Progress Notes (Signed)
Pharmacy Antibiotic Note  Rodney Romero. is a 74 y.o. male admitted on 04/28/2020 with bacteremia.  Pharmacy has been consulted for vancomycin dosing.  Plan: - Vancomycin 1000 mg IV x1, followed by vancomycin 500 mg IV every 12 hours  - Monitor renal function and clinical status - Follow-up cultures and de-escalate     Temp (24hrs), Avg:100.5 F (38.1 C), Min:99 F (37.2 C), Max:102.8 F (39.3 C)  Recent Labs  Lab 04/28/20 1608 04/28/20 2334 04/29/20 0134 04/29/20 0255 04/29/20 0841  WBC 12.5*  --   --  10.5 5.8  CREATININE 0.82  --   --  0.97  --   LATICACIDVEN  --  1.3 1.8  --   --     Estimated Creatinine Clearance: 50.3 mL/min (by C-G formula based on SCr of 0.97 mg/dL).    No Known Allergies  Antimicrobials this admission: Metronidazole 5/27 x1 Ceftriaxone 5/27 x1 Levofloxacin 5/27 x1 Cefepime 5/27 >> Vancomycin 5/27 >>  Microbiology results: 5/26 BCx x2: enterobacteriaceae, kleb PNA 5/26 Wound Cx (bile): sent     Marliss Buttacavoli L. Devin Going, [place] [facility] PGY1 Pharmacy Resident [phone] 04/29/20    8:25 PM  Please check AMION for all [facility] phone numbers After 10:00 PM, call the [facility] [phone]

## 2020-04-29 NOTE — ED Notes (Signed)
Dr. Grandville Silos returned call. Provider made aware of the low BP and 51ml bolus was initiated by this RN. Received orders for follow up labs.

## 2020-04-29 NOTE — ED Notes (Signed)
Pt incontinent of stool, able to assist minimally with log rolling.

## 2020-04-29 NOTE — ED Notes (Signed)
Pt restful without changes noted.  Denies any needs.

## 2020-04-29 NOTE — H&P (Signed)
History and Physical    [name]. [id number] DOB: 1946-08-18 DOA: 04/28/2020  PCP: [facility]  Patient coming from: Home.  Chief Complaint: Abdominal pain.  HPI: Rodney Romero. is a 74 y.o. 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  Pain has been persistent.  ED Course: In the ER patient underwent CT abdomen pelvis and sonogram of the abdomen which shows features concerning for acute cholecystitis.  Labs show patient has worsening hemoglobin when compared to recent past but when compared to last week done at [facility] it is dropped by 1 g.  LFTs are elevated at AST 189 ALT 151 total bilirubin 1.7.  ER physician discussed with on-call oncologist at [facility] about transfer but since there was no bed available patient started on empiric antibiotics admitted at [facility].  Covid test was negative.  EKG shows normal sinus rhythm.  Review of Systems: As per HPI, rest all negative.   Past Medical History:  Diagnosis Date  . Bipolar 1 disorder ([place])   . Cancer ([place])   . Hypertension   . Hypothyroidism   . PTSD (post-traumatic stress disorder)     Past Surgical History:  Procedure Laterality Date  . ERCP N/A 02/05/2020   Procedure: ENDOSCOPIC RETROGRADE CHOLANGIOPANCREATOGRAPHY (ERCP);  Surgeon: Clarene Essex, MD;  Location: [facility];  Service: Endoscopy;  Laterality: N/A;  . GALLBLADDER SURGERY    . IR ENDOLUMINAL BX OF BILIARY TREE  02/06/2020  . IR EXCHANGE BILIARY DRAIN  03/10/2020  . IR INT EXT BILIARY DRAIN WITH CHOLANGIOGRAM  02/06/2020  . SPHINCTEROTOMY  02/05/2020   Procedure: SPHINCTEROTOMY;   Surgeon: Clarene Essex, MD;  Location: [facility] ENDOSCOPY;  Service: Endoscopy;;  . TIBIA IM NAIL INSERTION Left 06/26/2018   Procedure: left tibia intramedullary nail;  Surgeon: Gaynelle Arabian, MD;  Location: [facility] ORS;  Service: Orthopedics;  Laterality: Left;     reports that he quit smoking about 5 years ago. His smoking use included cigarettes. He smoked 0.50 packs per day. He has never used smokeless tobacco. He reports previous alcohol use. He reports that he does not use drugs.  No Known Allergies  Family History  Problem Relation Age of Onset  . Hypertension Mother   . Diabetes Mother   . Seizures Father     Prior to Admission medications   Medication Sig Start Date End Date Taking? Authorizing Provider  amLODipine (NORVASC) 10 MG tablet Take 10 mg by mouth daily.    [provider]  amoxicillin-clavulanate (AUGMENTIN) 875-125 MG tablet Take 1 tablet by mouth every 12 (twelve) hours. 03/22/20   Mercy Riding, MD  aspirin EC 81 MG tablet Take 81 mg by mouth daily.    [provider]  fluticasone (FLONASE) 50 MCG/ACT nasal spray Place 1 spray into both nostrils daily.    [provider]  HYDROcodone-acetaminophen (NORCO/VICODIN) 5-325 MG tablet Take 1-2 tablets by mouth every 6 (six) hours as needed for moderate pain or severe pain. 03/10/20   Monia Sabal, PA-C  levothyroxine (SYNTHROID) 150 MCG tablet Take 150 mcg by mouth daily before breakfast.    [provider]  loratadine (CLARITIN) 10 MG tablet  Take 10 mg by mouth daily.    [provider]  mirtazapine (REMERON) 15 MG tablet Take 1 tablet (15 mg total) by mouth at bedtime. 03/22/20   Mercy Riding, MD  Multiple Vitamins-Minerals (MULTIVITAMIN WITH MINERALS) tablet Take 1 tablet by mouth daily.    [provider]  omeprazole (PRILOSEC) 20 MG capsule Take 20 mg by mouth daily.    [provider]  polyethylene glycol powder (MIRALAX) 17 GM/SCOOP powder Take 17 g by mouth 2 (two)  times daily as needed for moderate constipation. 03/22/20   Mercy Riding, MD  senna-docusate (SENOKOT-S) 8.6-50 MG tablet Take 1 tablet by mouth 2 (two) times daily between meals as needed for mild constipation. 03/22/20   Mercy Riding, MD    Physical Exam: Constitutional: Moderately built and nourished. Vitals:   04/28/20 2013 04/28/20 2230 04/29/20 0050 04/29/20 0215  BP: (!) 97/57 (!) 115/54 103/63 (!) 99/59  Pulse: 94     Resp: 18  17 17   Temp:      TempSrc:      SpO2: 96%      Eyes: Anicteric no pallor. ENMT: No discharge from the ears eyes nose or mouth. Neck: No mass felt.  No neck rigidity. Respiratory: No rhonchi or crepitations. Cardiovascular: S1-S2 heard. Abdomen: Right upper quadrant tenderness.  No guarding or rigidity. Musculoskeletal: No edema. Skin: No rash. Neurologic: Alert awake oriented to time place and person.  Moves all extremities. Psychiatric: Appears normal per normal affect.   Labs on Admission: I have personally reviewed following labs and imaging studies  CBC: Recent Labs  Lab 04/28/20 1608  WBC 12.5*  HGB 7.7*  HCT 22.7*  MCV 68.4*  PLT 99991111   Basic Metabolic Panel: Recent Labs  Lab 04/28/20 1608  NA 130*  K 3.8  CL 95*  CO2 24  GLUCOSE 118*  BUN 17  CREATININE 0.82  CALCIUM 8.8*   GFR: Estimated Creatinine Clearance: 59.5 mL/min (by C-G formula based on SCr of 0.82 mg/dL). Liver Function Tests: Recent Labs  Lab 04/28/20 1608  AST 105*  ALT 110*  ALKPHOS 542*  BILITOT 1.8*  PROT 6.5  ALBUMIN 2.3*   Recent Labs  Lab 04/28/20 1608  LIPASE 23   No results for input(s): AMMONIA in the last 168 hours. Coagulation Profile: Recent Labs  Lab 04/28/20 2334  INR 1.3*   Cardiac Enzymes: No results for input(s): CKTOTAL, CKMB, CKMBINDEX, TROPONINI in the last 168 hours. BNP (last 3 results) No results for input(s): PROBNP in the last 8760 hours. HbA1C: No results for input(s): HGBA1C in the last 72 hours. CBG: No  results for input(s): GLUCAP in the last 168 hours. Lipid Profile: No results for input(s): CHOL, HDL, LDLCALC, TRIG, CHOLHDL, LDLDIRECT in the last 72 hours. Thyroid Function Tests: No results for input(s): TSH, T4TOTAL, FREET4, T3FREE, THYROIDAB in the last 72 hours. Anemia Panel: No results for input(s): VITAMINB12, FOLATE, FERRITIN, TIBC, IRON, RETICCTPCT in the last 72 hours. Urine analysis:    Component Value Date/Time   COLORURINE AMBER (A) 04/28/2020 2342   APPEARANCEUR CLEAR 04/28/2020 2342   LABSPEC 1.016 04/28/2020 2342   PHURINE 5.0 04/28/2020 [place] 04/28/2020 2342   HGBUR NEGATIVE 04/28/2020 2342   BILIRUBINUR NEGATIVE 04/28/2020 [place] 04/28/2020 2342   PROTEINUR 30 (A) 04/28/2020 2342   UROBILINOGEN 4.0 (H) 04/28/2020 1414   NITRITE NEGATIVE 04/28/2020 2342   LEUKOCYTESUR NEGATIVE 04/28/2020 2342   Sepsis Labs: @  LABRCNTIP(procalcitonin:4,lacticidven:4) ) Recent Results (from the past 240 hour(s))  SARS Coronavirus 2 by RT PCR (hospital order, performed in [facility] hospital lab) Nasopharyngeal Nasopharyngeal Swab     Status: None   Collection Time: 04/28/20 11:42 PM   Specimen: Nasopharyngeal Swab  Result Value Ref Range Status   SARS Coronavirus 2 NEGATIVE NEGATIVE Final    Comment: (NOTE) SARS-CoV-2 target nucleic acids are NOT DETECTED. The SARS-CoV-2 RNA is generally detectable in upper and lower respiratory specimens during the acute phase of infection. The lowest concentration of SARS-CoV-2 viral copies this assay can detect is 250 copies / mL. A negative result does not preclude SARS-CoV-2 infection and should not be used as the sole basis for treatment or other patient management decisions.  A negative result may occur with improper specimen collection / handling, submission of specimen other than nasopharyngeal swab, presence of viral mutation(s) within the areas targeted by this assay, and inadequate number of  viral copies (<250 copies / mL). A negative result must be combined with clinical observations, patient history, and epidemiological information. Fact Sheet for Patients:   StrictlyIdeas.no Fact Sheet for Healthcare Providers: BankingDealers.co.za This test is not yet approved or cleared  by the [place] FDA and has been authorized for detection and/or diagnosis of SARS-CoV-2 by FDA under an Emergency Use Authorization (EUA).  This EUA will remain in effect (meaning this test can be used) for the duration of the COVID-19 declaration under Section 564(b)(1) of the Act, 21 U.S.C. section 360bbb-3(b)(1), unless the authorization is terminated or revoked sooner. Performed at [facility], [place] [address] [place], [place] [address]      Radiological Exams on Admission: CT Abdomen Pelvis W Contrast  Result Date: 04/28/2020 CLINICAL DATA:  Known cholangiocarcinoma, right upper quadrant pain EXAM: CT ABDOMEN AND PELVIS WITH CONTRAST TECHNIQUE: Multidetector CT imaging of the abdomen and pelvis was performed using the standard protocol following bolus administration of intravenous contrast. CONTRAST:  113mL OMNIPAQUE IOHEXOL 300 MG/ML  SOLN COMPARISON:  MR abdomen 03/20/2020, CT 03/05/2020 FINDINGS: Lower chest: Bibasilar atelectasis with more dense bandlike areas of opacity in the right lung base likely reflecting subsegmental atelectatic changes well. No visible pulmonary nodules mild cardiomegaly. No pericardial effusion. Distal thoracic aortic atherosclerosis. Hepatobiliary: There are multiple targetoid lesions with central hypoattenuation and peripheral rims of more intermediate attenuation throughout the all segments of the right lobe liver. Some associated capsular retraction is seen along the anterior right lobe (8/11) marked intrahepatic biliary ductal dilatation is noted throughout the right liver as well. This is despite the presence of  an internal biliary drain originating in the main hepatic duct in terminating in the duodenum. A prior percutaneous biliary drain no longer continues to the skin surface, terminating in the muscle belly of the superior aspect rectus sheath (8/5). There is a small fluid and gas containing collection measuring approximately 2.1 x 2.3 cm which is contiguous between the muscle belly and hepatic capsule. Catheter seen terminating distally in the common bile duct. Ill-defined hypoattenuation about the drainage catheters and the central right hepatic duct is noted as well (8/16). Much of the left lobe appears unaffected by these acute changes the gallbladder is circumferentially thickened with adjacent pericholecystic hazy stranding. No visible intraductal gallstones. Pancreas: Unremarkable. No pancreatic ductal dilatation or surrounding inflammatory changes. Spleen: Normal in size without focal abnormality. Adrenals/Urinary Tract: Normal adrenal glands. There is symmetric renal enhancement and slightly delayed excretion bilaterally on more delayed phase imaging. Multiple clustered cysts again  seen in the lower pole right kidney including several layering milk of calcium cysts. As well as an additional fluid attenuation cyst in the lower pole left kidney all appear grossly unchanged or diminished in size from the comparison exam. No new concerning renal lesion. No urolithiasis or hydronephrosis. There is circumferential bladder wall thickening. Indentation of the bladder base by the enlarged prostate. Stomach/Bowel: Distal esophagus, stomach and duodenal sweep are unremarkable. No small bowel wall thickening or dilatation. No evidence of obstruction. Air-filled appendix in the right lower quadrant. No colonic dilatation or wall thickening. Vascular/Lymphatic: Atherosclerotic calcifications throughout the abdominal aorta and branch vessels. No aneurysm or ectasia. No enlarged abdominopelvic lymph nodes. Reproductive:  Prostatomegaly with enhancing nodule in the periphery near the base of the gland (3/71) indeterminate. Other: Mild circumferential body wall edema. Small volume low-attenuation free fluid in the deep pelvis. No abdominopelvic free air. No bowel containing hernias. Musculoskeletal: Multilevel degenerative changes are present in the imaged portions of the spine. No acute osseous abnormality or suspicious osseous lesion. IMPRESSION: 1. Ill-defined region of hypoattenuation centered upon the central liver and traversed by the multiple drainage catheters compatible with site of primary cholangiocarcinoma. 2. Multiple targetoid lesions throughout the all segments of the right lobe liver with central hypoattenuation and peripheral rims of more intermediate attenuation. Findings are compatible with metastatic disease. 3. A prior percutaneous biliary drain appears abandoned, no longer continuing to the skin surface, terminating in the muscle belly of the superior aspect of the rectus sheath with a small fluid and air containing collection adjacent the tip contiguous with the superior belly of the rectus muscle and hepatic capsule. Could reflect drainage about catheter tip a an abscess is difficult to exclude. 4. Circumferentially thickened gallbladder wall with adjacent pericholecystic hazy stranding, suspicious for acute cholecystitis. Recommend correlation with right upper quadrant ultrasound. 5. Prostatomegaly with enhancing nodule in the periphery near the base of the gland indeterminate. Recommend correlation with PSA and consider further evaluation with dedicated prostate imaging. 6. Circumferential bladder wall thickening with faint perivesicular hazy stranding as well as delayed excretion of the kidneys, possibly related to chronic outlet obstruction though should correlate with urinalysis to exclude cystitis and ascending tract infection. 7. Aortic Atherosclerosis (ICD10-I70.0). These results were called by  telephone at the time of interpretation on 04/28/2020 at 11:33 pm to provider Fredia Sorrow , who verbally acknowledged these results. Electronically Signed   By: Lovena Le M.D.   On: 04/28/2020 23:34   US Abdomen Limited RUQ  Result Date: 04/29/2020 CLINICAL DATA:  Right upper quadrant pain EXAM: ULTRASOUND ABDOMEN LIMITED RIGHT UPPER QUADRANT COMPARISON:  CT abdomen pelvis 04/28/2020 FINDINGS: Gallbladder: Gallbladder contains hypoechoic biliary sludge. There is mild gallbladder wall thickening of 4 mm. Pericholecystic fluid is noted. Sonographic Percell Miller sign is reportedly positive. Common bile duct: Diameter: 6 mm Liver: There is redemonstration of the extensive intrahepatic biliary ductal dilatation throughout the right lobe liver seen on comparison CT. Corresponding to the several of the more ill-defined hypoattenuating foci seen on CT are more cystic appearing lesions including lesions in the anterior right lobe liver measuring 1.8 x 1.4 x 2.2 cm and 2.3 x 1.8 x 3.2 cm as well as a more complex posterior cystic structure measuring 2.8 x 1.9 x 2.5 cm with low level internal echoes and internal debris. Portal vein is patent on color Doppler imaging with normal direction of blood flow towards the liver. Other: None. IMPRESSION: Sonographic features are suggestive of acute cholecystitis in the appropriate clinical  setting. Redemonstration of the extensive intrahepatic biliary ductal dilatation throughout the right lobe liver. With central occlusion presumably from patient's known cholangiocarcinoma. Ill-defined hypoattenuating lesions CT appear to correspond to numerous cystic foci throughout the right liver, new since April of 2021. In the setting of known cholangiocarcinoma, these findings could reflect the development of biloma as extension of the cholangiocarcinoma process. However, given the presence of fever and concern for sepsis, superinfection or formation of de Novo abscesses are not excluded  particularly given the more ill-defined margins of these lesions on the CT imaging. Consider further evaluation with contrast-enhanced MRI following patient stabilization. These results were called by telephone at the time of interpretation on 04/29/2020 at 1:03 am to provider Ripley Fraise , who verbally acknowledged these results. Electronically Signed   By: Lovena Le M.D.   On: 04/29/2020 00:58    EKG: Independently reviewed.  Normal sinus rhythm.  Assessment/Plan Principal Problem:   Acute cholecystitis Active Problems:   Essential hypertension   Hypothyroidism   Anemia    1. Acute cholecystitis with possible evolving sepsis for which patient has been started on empiric antibiotics.  ER physician did discuss with on-call oncologist Dr. Laroy Apple at [facility] who is agreeable to accept the patient but since there is no beds available patient has been admitted to [facility].  If patient remains at [facility] then will consult interventional radiology for possible drain placement.  Empiric antibiotics n.p.o. for now.  IV fluids.  Follow cultures. 2. Anemia appears to be worsening likely from recent chemotherapy.  Type and screen transfuse if hemoglobin falls less than 7. 3. Hypertension was mildly hypotensive at presentation presently n.p.o. we will keep patient as needed IV hydralazine. 4. Hypothyroidism we will keep patient IV Synthroid since patient is n.p.o. 5. Hilar cholangiocarcinoma being treated at [facility] oncology received chemotherapy last week.  Since patient has possibility of developing sepsis from cholecystitis will need close monitoring inpatient status.   DVT prophylaxis: SCDs since patient may need procedure avoiding pharmacological DVT prophylaxis. Code Status: Full code. Family Communication: Discussed with patient. Disposition Plan: To be determined. Consults called: None. Admission status: Inpatient.   Rise Patience MD [facility] Pager [phone].  If 7PM-7AM, please contact night-coverage [email or web address] Password [facility]  04/29/2020, 2:24 AM

## 2020-04-29 NOTE — Consult Note (Signed)
[facility] Surgery Consult Note  Rodney Romero. Jun 09, 1946  [id number].    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  Patient underwent ERCP on 3/4 with biliary sphincterotomy PTC with brushings PTC was capped prior to discharge.  He was referred to [facility] is currently undergoing treatment for cholangiocarcinoma.  His last treatment was last week.  He was seen in April and underwent IR drain for cholecystitis.  Drain has subsequently been removed.  Patient is confused and sedated he can give no additional history. He presented to the ED yesterday in the early a.m. with 3 days of increasing right upper quadrant abdominal pain no nausea vomiting diarrhea or chills.  Work-up in the ED shows fever up to 102.8.  He is tachycardic and hypotensive.  Labs show sodium of 132, glucose of 117, alk phos of 442, AST 189, ALT 151, total bilirubin 1.7.  Lactate is 1.8, WBC 10.5 last p.m., 5.8 this AM.  H/H 7.1/20.9 last p.m., 5.7/16.7 this AM.  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  Prior percutaneous biliary drain is no longer present.  Gallbladder is thickened with adjacent pericholecystic hazy stranding suspicious for acute cholecystitis.  Abdominal ultrasound obtained this morning shows biliary sludge mild gallbladder wall thickening pericholecystic fluid, common bile duct 6 mm.  Extensive intrahepatic biliary ductal dilatation throughout the right lobe of the liver with central  occlusion presumably from the cholangiocarcinoma, possible development of a biloma or possible abscess.   ROS: Review of Systems  Unable to perform ROS: Medical condition    Family History  Problem Relation Age of Onset  . Hypertension Mother   . Diabetes Mother   . Seizures Father     Past Medical History:  Diagnosis Date  . Bipolar 1 disorder ([place])   . Cancer ([place])   . Hypertension   . Hypothyroidism   . PTSD (post-traumatic stress disorder)     Past Surgical History:  Procedure Laterality Date  . ERCP N/A 02/05/2020   Procedure: ENDOSCOPIC RETROGRADE CHOLANGIOPANCREATOGRAPHY (ERCP);  Surgeon: Clarene Essex, MD;  Location: [place];  Service: Endoscopy;  Laterality: N/A;  . GALLBLADDER SURGERY    . IR ENDOLUMINAL BX OF BILIARY TREE  02/06/2020  . IR EXCHANGE BILIARY DRAIN  03/10/2020  . IR INT EXT BILIARY DRAIN WITH CHOLANGIOGRAM  02/06/2020  . SPHINCTEROTOMY  02/05/2020   Procedure: SPHINCTEROTOMY;  Surgeon: Clarene Essex, MD;  Location: [facility] ENDOSCOPY;  Service: Endoscopy;;  . TIBIA IM NAIL INSERTION Left 06/26/2018   Procedure: left tibia intramedullary nail;  Surgeon: Gaynelle Arabian, MD;  Location: [facility];  Service: Orthopedics;  Laterality: Left;    Social History:  reports that he quit smoking about 5 years ago. His smoking use included cigarettes. He smoked 0.50 packs per day. He has never used smokeless tobacco. He reports previous alcohol use. He reports that he does not use drugs.  Allergies: No Known Allergies  (Not in a hospital admission)   Blood pressure (!) 77/48, pulse (!) 103, temperature (!) 102.8 F (39.3 C), temperature source Rectal, resp. rate 20, SpO2 94 %. Physical Exam:  General: Confused,  extremely sick African-American male.  Intermittently tender in the abdomen.  Hypotensive with ongoing fever.  He has had some pain medication and really cannot answer any questions HEENT: head is normocephalic, atraumatic.  Sclera are noninjected.  Pupils are equal.   Ears and nose without any masses or lesions.  Mouth is pink and moist Heart: regular, rate, and rhythm.  Normal s1,s2. No obvious murmurs, gallops, or rubs noted.  Palpable radial and pedal pulses bilaterally Lungs: CTAB, no wheezes, rhonchi, or rales noted.  Respiratory effort nonlabored Abd: Minimal distention he is intermittently tender on exam.  Positive bowel sounds.  No external drains. MS: all 4 extremities are symmetrical with no cyanosis, clubbing, or edema. Skin: warm and dry with no masses, lesions, or rashes Neuro: Cranial nerves 2-12 grossly intact, sensation is normal throughout Psych: Patient is confused,  he cannot answer questions at this time.  He was not able to give any history at all.  Results for orders placed or performed during the hospital encounter of 04/28/20 (from the past 48 hour(s))  Lipase, blood     Status: None   Collection Time: 04/28/20  4:08 PM  Result Value Ref Range   Lipase 23 11 - 51 U/L    Comment: Performed at [facility], [place] [address], [place], [place] [address]  Comprehensive metabolic panel     Status: Abnormal   Collection Time: 04/28/20  4:08 PM  Result Value Ref Range   Sodium 130 (L) 135 - 145 mmol/L   Potassium 3.8 3.5 - 5.1 mmol/L   Chloride 95 (L) 98 - 111 mmol/L   CO2 24 22 - 32 mmol/L   Glucose, Bld 118 (H) 70 - 99 mg/dL    Comment: Glucose reference range applies only to samples taken after fasting for at least 8 hours.   BUN 17 8 - 23 mg/dL   Creatinine, Ser 0.82 0.61 - 1.24 mg/dL   Calcium 8.8 (L) 8.9 - 10.3 mg/dL   Total Protein 6.5 6.5 - 8.1 g/dL   Albumin 2.3 (L) 3.5 - 5.0 g/dL   AST 105 (H) 15 - 41 U/L   ALT 110 (H) 0 - 44 U/L   Alkaline Phosphatase 542 (H) 38 - 126 U/L   Total Bilirubin 1.8 (H) 0.3 - 1.2 mg/dL   GFR calc non Af Amer >60 >60 mL/min   GFR calc Af Amer >60 >60 mL/min   Anion gap 11 5 - 15    Comment: Performed at [facility] [address] [place], [place] [address]  CBC     Status:  Abnormal   Collection Time: 04/28/20  4:08 PM  Result Value Ref Range   WBC 12.5 (H) 4.0 - 10.5 K/uL   RBC 3.32 (L) 4.22 - 5.81 MIL/uL   Hemoglobin 7.7 (L) 13.0 - 17.0 g/dL    Comment: Reticulocyte Hemoglobin testing may be clinically indicated, consider ordering this additional test [id number]    HCT 22.7 (L) 39.0 - 52.0 %   MCV 68.4 (L) 80.0 - 100.0 fL   MCH 23.2 (L) 26.0 - 34.0 pg   MCHC 33.9 30.0 - 36.0 g/dL   RDW 13.7 11.5 - 15.5 %   Platelets 245 150 - 400 K/uL    Comment: REPEATED TO VERIFY   nRBC 0.0 0.0 - 0.2 %    Comment: Performed at [facility], [place] [address] [place], [place] [address]  Lactic acid, plasma     Status: None  Collection Time: 04/28/20 11:34 PM  Result Value Ref Range   Lactic Acid, Venous 1.3 0.5 - 1.9 mmol/L    Comment: Performed at [facility], [place] [address], [place], [place] [address]  Protime-INR     Status: Abnormal   Collection Time: 04/28/20 11:34 PM  Result Value Ref Range   Prothrombin Time 15.8 (H) 11.4 - 15.2 seconds   INR 1.3 (H) 0.8 - 1.2    Comment: (NOTE) INR goal varies based on device and disease states. Performed at [facility], [place] [address] [place], [place] [address]   Urinalysis, Routine w reflex microscopic     Status: Abnormal   Collection Time: 04/28/20 11:42 PM  Result Value Ref Range   Color, Urine AMBER (A) YELLOW    Comment: BIOCHEMICALS MAY BE AFFECTED BY COLOR   APPearance CLEAR CLEAR   Specific Gravity, Urine 1.016 1.005 - 1.030   pH 5.0 5.0 - 8.0   Glucose, UA NEGATIVE NEGATIVE mg/dL   Hgb urine dipstick NEGATIVE NEGATIVE   Bilirubin Urine NEGATIVE NEGATIVE   Ketones, ur NEGATIVE NEGATIVE mg/dL   Protein, ur 30 (A) NEGATIVE mg/dL   Nitrite NEGATIVE NEGATIVE   Leukocytes,Ua NEGATIVE NEGATIVE   RBC / HPF 0-5 0 - 5 RBC/hpf   WBC, UA 0-5 0 - 5 WBC/hpf   Bacteria, UA NONE SEEN NONE SEEN   Squamous Epithelial / LPF 0-5 0 - 5   Mucus PRESENT    Hyaline Casts, UA PRESENT    Ca Oxalate  Crys, UA PRESENT     Comment: Performed at [facility], [address], [place], [place] [address]  SARS Coronavirus 2 by RT PCR (hospital order, performed in [facility] hospital lab) Nasopharyngeal Nasopharyngeal Swab     Status: None   Collection Time: 04/28/20 11:42 PM   Specimen: Nasopharyngeal Swab  Result Value Ref Range   SARS Coronavirus 2 NEGATIVE NEGATIVE    Comment: (NOTE) SARS-CoV-2 target nucleic acids are NOT DETECTED. The SARS-CoV-2 RNA is generally detectable in upper and lower respiratory specimens during the acute phase of infection. The lowest concentration of SARS-CoV-2 viral copies this assay can detect is 250 copies / mL. A negative result does not preclude SARS-CoV-2 infection and should not be used as the sole basis for treatment or other patient management decisions.  A negative result may occur with improper specimen collection / handling, submission of specimen other than nasopharyngeal swab, presence of viral mutation(s) within the areas targeted by this assay, and inadequate number of viral copies (<250 copies / mL). A negative result must be combined with clinical observations, patient history, and epidemiological information. Fact Sheet for Patients:   StrictlyIdeas.no Fact Sheet for Healthcare Providers: BankingDealers.co.za This test is not yet approved or cleared  by the [place] FDA and has been authorized for detection and/or diagnosis of SARS-CoV-2 by FDA under an Emergency Use Authorization (EUA).  This EUA will remain in effect (meaning this test can be used) for the duration of the COVID-19 declaration under Section 564(b)(1) of the Act, 21 U.S.C. section 360bbb-3(b)(1), unless the authorization is terminated or revoked sooner. Performed at [facility], [place] [address] [place], [place] [address]   Blood Culture (routine x 2)     Status: None (Preliminary result)   Collection  Time: 04/28/20 11:54 PM   Specimen: BLOOD  Result Value Ref Range   Specimen Description BLOOD LEFT ANTECUBITAL    Special Requests  BOTTLES DRAWN AEROBIC AND ANAEROBIC Blood Culture adequate volume   Culture      NO GROWTH < 12 HOURS Performed at [facility] [address] [place], [place] [address]    Report Status PENDING   Lactic acid, plasma     Status: None   Collection Time: 04/29/20  1:34 AM  Result Value Ref Range   Lactic Acid, Venous 1.8 0.5 - 1.9 mmol/L    Comment: Performed at [facility] [address], [place], [place] [address]  Hepatic function panel     Status: Abnormal   Collection Time: 04/29/20  2:55 AM  Result Value Ref Range   Total Protein 5.9 (L) 6.5 - 8.1 g/dL   Albumin 2.0 (L) 3.5 - 5.0 g/dL   AST 189 (H) 15 - 41 U/L   ALT 151 (H) 0 - 44 U/L   Alkaline Phosphatase 442 (H) 38 - 126 U/L   Total Bilirubin 1.7 (H) 0.3 - 1.2 mg/dL   Bilirubin, Direct 1.1 (H) 0.0 - 0.2 mg/dL   Indirect Bilirubin 0.6 0.3 - 0.9 mg/dL    Comment: Performed at [facility] [address], [place], [place] [address]  Basic metabolic panel     Status: Abnormal   Collection Time: 04/29/20  2:55 AM  Result Value Ref Range   Sodium 132 (L) 135 - 145 mmol/L   Potassium 3.8 3.5 - 5.1 mmol/L   Chloride 99 98 - 111 mmol/L   CO2 24 22 - 32 mmol/L   Glucose, Bld 117 (H) 70 - 99 mg/dL    Comment: Glucose reference range applies only to samples taken after fasting for at least 8 hours.   BUN 16 8 - 23 mg/dL   Creatinine, Ser 0.97 0.61 - 1.24 mg/dL   Calcium 8.4 (L) 8.9 - 10.3 mg/dL   GFR calc non Af Amer >60 >60 mL/min   GFR calc Af Amer >60 >60 mL/min   Anion gap 9 5 - 15    Comment: Performed at [facility] [address], [place],  [address]  CBC WITH DIFFERENTIAL     Status: Abnormal   Collection Time: 04/29/20  2:55 AM  Result Value Ref Range   WBC 10.5 4.0 - 10.5 K/uL   RBC 3.02 (L) 4.22 - 5.81 MIL/uL   Hemoglobin 7.1 (L) 13.0 -  17.0 g/dL    Comment: Reticulocyte Hemoglobin testing may be clinically indicated, consider ordering this additional test [id number]    HCT 20.9 (L) 39.0 - 52.0 %   MCV 69.2 (L) 80.0 - 100.0 fL   MCH 23.5 (L) 26.0 - 34.0 pg   MCHC 34.0 30.0 - 36.0 g/dL   RDW 13.8 11.5 - 15.5 %   Platelets 195 150 - 400 K/uL    Comment: REPEATED TO VERIFY PLATELET CLUMPING, SUGGEST RECOLLECTION OF SAMPLE IN CITRATE TUBE.    nRBC 0.0 0.0 - 0.2 %   Neutrophils Relative % 88 %   Neutro Abs 9.2 (H) 1.7 - 7.7 K/uL   Lymphocytes Relative 4 %   Lymphs Abs 0.4 (L) 0.7 - 4.0 K/uL   Monocytes Relative 0 %   Monocytes Absolute 0.0 (L) 0.1 - 1.0 K/uL   Eosinophils Relative 0 %   Eosinophils Absolute 0.0 0.0 - 0.5 K/uL   Basophils Relative 0 %   Basophils Absolute 0.0 0.0 - 0.1 K/uL   Immature Granulocytes 8 %   Abs Immature Granulocytes 0.88 (H) 0.00 -  0.07 K/uL   Target Cells PRESENT     Comment: Performed at [facility], [place] [address] [place], [place] [address]  Type and screen [place]     Status: None (Preliminary result)   Collection Time: 04/29/20  2:55 AM  Result Value Ref Range   ABO/RH(D) O POS    Antibody Screen NEG    Sample Expiration      05/02/2020,2359 Performed at [facility], [place] [address], [place], [place] [address]    Unit Number [id number]    Blood Component Type RED CELLS,LR    Unit division 00    Status of Unit ALLOCATED    Transfusion Status OK TO TRANSFUSE    Crossmatch Result Compatible    Unit Number [id number]    Blood Component Type RED CELLS,LR    Unit division 00    Status of Unit ALLOCATED    Transfusion Status OK TO TRANSFUSE    Crossmatch Result Compatible   CBG monitoring, ED     Status: None   Collection Time: 04/29/20  3:25 AM  Result Value Ref Range   Glucose-Capillary 82 70 - 99 mg/dL    Comment: Glucose reference range applies only to samples taken after fasting for at least 8 hours.   Comment 1 Document in  Chart   CBG monitoring, ED     Status: Abnormal   Collection Time: 04/29/20  6:11 AM  Result Value Ref Range   Glucose-Capillary 63 (L) 70 - 99 mg/dL    Comment: Glucose reference range applies only to samples taken after fasting for at least 8 hours.  CBG monitoring, ED     Status: Abnormal   Collection Time: 04/29/20  7:06 AM  Result Value Ref Range   Glucose-Capillary 154 (H) 70 - 99 mg/dL    Comment: Glucose reference range applies only to samples taken after fasting for at least 8 hours.  Vitamin B12     Status: Abnormal   Collection Time: 04/29/20  8:41 AM  Result Value Ref Range   Vitamin B-12 1,121 (H) 180 - 914 pg/mL    Comment: (NOTE) This assay is not validated for testing neonatal or myeloproliferative syndrome specimens for Vitamin B12 levels. Performed at [facility], [place] [address], [place], [place] [address]   Iron and TIBC     Status: Abnormal   Collection Time: 04/29/20  8:41 AM  Result Value Ref Range   Iron 12 (L) 45 - 182 ug/dL   TIBC 122 (L) 250 - 450 ug/dL   Saturation Ratios 10 (L) 17.9 - 39.5 %   UIBC 110 ug/dL    Comment: Performed at [facility], [place] [address], [place], [place] [address]  Ferritin     Status: Abnormal   Collection Time: 04/29/20  8:41 AM  Result Value Ref Range   Ferritin 1,474 (H) 24 - 336 ng/mL    Comment: Performed at [facility], [place] [address], [place], [place] [address]  CBC with Differential/Platelet     Status: Abnormal (Preliminary result)   Collection Time: 04/29/20  8:41 AM  Result Value Ref Range   WBC 5.8 4.0 - 10.5 K/uL   RBC 2.42 (L) 4.22 - 5.81 MIL/uL   Hemoglobin 5.7 (LL) 13.0 - 17.0 g/dL    Comment: REPEATED TO VERIFY Reticulocyte Hemoglobin testing may be clinically indicated, consider ordering this additional test [id number] THIS CRITICAL RESULT HAS VERIFIED AND BEEN CALLED TO  MICHELLE COFFEY,RN BY ZELDA BEECH ON 05 27 2021 AT 0946, AND HAS BEEN READ BACK.     HCT 16.7 (L) 39.0 - 52.0 %    MCV 69.0 (L) 80.0 - 100.0 fL   MCH 23.6 (L) 26.0 - 34.0 pg   MCHC 34.1 30.0 - 36.0 g/dL   RDW 13.6 11.5 - 15.5 %   Platelets 146 (L) 150 - 400 K/uL    Comment: REPEATED TO VERIFY   nRBC 0.0 0.0 - 0.2 %    Comment: Performed at [facility] [address] [place], [place] [address]   Neutrophils Relative % PENDING %   Neutro Abs PENDING 1.7 - 7.7 K/uL   Band Neutrophils PENDING %   Lymphocytes Relative PENDING %   Lymphs Abs PENDING 0.7 - 4.0 K/uL   Monocytes Relative PENDING %   Monocytes Absolute PENDING 0.1 - 1.0 K/uL   Eosinophils Relative PENDING %   Eosinophils Absolute PENDING 0.0 - 0.5 K/uL   Basophils Relative PENDING %   Basophils Absolute PENDING 0.0 - 0.1 K/uL   WBC Morphology PENDING    RBC Morphology PENDING    Smear Review PENDING    Other PENDING %   nRBC PENDING 0 /100 WBC   Metamyelocytes Relative PENDING %   Myelocytes PENDING %   Promyelocytes Relative PENDING %   Blasts PENDING %   Immature Granulocytes PENDING %   Abs Immature Granulocytes PENDING 0.00 - 0.07 K/uL  Folate     Status: None   Collection Time: 04/29/20  8:42 AM  Result Value Ref Range   Folate 11.8 >5.9 ng/mL    Comment: Performed at [facility] [address], [place], [place] [address]  Reticulocytes     Status: Abnormal   Collection Time: 04/29/20  8:42 AM  Result Value Ref Range   Retic Ct Pct 0.5 0.4 - 3.1 %   RBC. 2.43 (L) 4.22 - 5.81 MIL/uL   Retic Count, Absolute 11.2 (L) 19.0 - 186.0 K/uL   Immature Retic Fract 4.3 2.3 - 15.9 %    Comment: Performed at [facility] [address] [place], [place] [address]  CBG monitoring, ED     Status: None   Collection Time: 04/29/20  8:56 AM  Result Value Ref Range   Glucose-Capillary 86 70 - 99 mg/dL    Comment: Glucose reference range applies only to samples taken after fasting for at least 8 hours.  Prepare RBC (crossmatch)     Status: None   Collection Time: 04/29/20  9:56 AM  Result Value Ref Range    Order Confirmation      ORDER PROCESSED BY BLOOD BANK Performed at [facility], [address], [place], [place] [address]    CT Abdomen Pelvis W Contrast  Result Date: 04/28/2020 CLINICAL DATA:  Known cholangiocarcinoma, right upper quadrant pain EXAM: CT ABDOMEN AND PELVIS WITH CONTRAST TECHNIQUE: Multidetector CT imaging of the abdomen and pelvis was performed using the standard protocol following bolus administration of intravenous contrast. CONTRAST:  180m OMNIPAQUE IOHEXOL 300 MG/ML  SOLN COMPARISON:  MR abdomen 03/20/2020, CT 03/05/2020 FINDINGS: Lower chest: Bibasilar atelectasis with more dense bandlike areas of opacity in the right lung base likely reflecting subsegmental atelectatic changes well. No visible pulmonary nodules mild cardiomegaly. No pericardial effusion. Distal thoracic aortic atherosclerosis. Hepatobiliary: There are multiple targetoid lesions with central hypoattenuation and peripheral rims of more intermediate attenuation throughout the all segments of the right  lobe liver. Some associated capsular retraction is seen along the anterior right lobe (8/11) marked intrahepatic biliary ductal dilatation is noted throughout the right liver as well. This is despite the presence of an internal biliary drain originating in the main hepatic duct in terminating in the duodenum. A prior percutaneous biliary drain no longer continues to the skin surface, terminating in the muscle belly of the superior aspect rectus sheath (8/5). There is a small fluid and gas containing collection measuring approximately 2.1 x 2.3 cm which is contiguous between the muscle belly and hepatic capsule. Catheter seen terminating distally in the common bile duct. Ill-defined hypoattenuation about the drainage catheters and the central right hepatic duct is noted as well (8/16). Much of the left lobe appears unaffected by these acute changes the gallbladder is circumferentially thickened with adjacent  pericholecystic hazy stranding. No visible intraductal gallstones. Pancreas: Unremarkable. No pancreatic ductal dilatation or surrounding inflammatory changes. Spleen: Normal in size without focal abnormality. Adrenals/Urinary Tract: Normal adrenal glands. There is symmetric renal enhancement and slightly delayed excretion bilaterally on more delayed phase imaging. Multiple clustered cysts again seen in the lower pole right kidney including several layering milk of calcium cysts. As well as an additional fluid attenuation cyst in the lower pole left kidney all appear grossly unchanged or diminished in size from the comparison exam. No new concerning renal lesion. No urolithiasis or hydronephrosis. There is circumferential bladder wall thickening. Indentation of the bladder base by the enlarged prostate. Stomach/Bowel: Distal esophagus, stomach and duodenal sweep are unremarkable. No small bowel wall thickening or dilatation. No evidence of obstruction. Air-filled appendix in the right lower quadrant. No colonic dilatation or wall thickening. Vascular/Lymphatic: Atherosclerotic calcifications throughout the abdominal aorta and branch vessels. No aneurysm or ectasia. No enlarged abdominopelvic lymph nodes. Reproductive: Prostatomegaly with enhancing nodule in the periphery near the base of the gland (3/71) indeterminate. Other: Mild circumferential body wall edema. Small volume low-attenuation free fluid in the deep pelvis. No abdominopelvic free air. No bowel containing hernias. Musculoskeletal: Multilevel degenerative changes are present in the imaged portions of the spine. No acute osseous abnormality or suspicious osseous lesion. IMPRESSION: 1. Ill-defined region of hypoattenuation centered upon the central liver and traversed by the multiple drainage catheters compatible with site of primary cholangiocarcinoma. 2. Multiple targetoid lesions throughout the all segments of the right lobe liver with central  hypoattenuation and peripheral rims of more intermediate attenuation. Findings are compatible with metastatic disease. 3. A prior percutaneous biliary drain appears abandoned, no longer continuing to the skin surface, terminating in the muscle belly of the superior aspect of the rectus sheath with a small fluid and air containing collection adjacent the tip contiguous with the superior belly of the rectus muscle and hepatic capsule. Could reflect drainage about catheter tip a an abscess is difficult to exclude. 4. Circumferentially thickened gallbladder wall with adjacent pericholecystic hazy stranding, suspicious for acute cholecystitis. Recommend correlation with right upper quadrant ultrasound. 5. Prostatomegaly with enhancing nodule in the periphery near the base of the gland indeterminate. Recommend correlation with PSA and consider further evaluation with dedicated prostate imaging. 6. Circumferential bladder wall thickening with faint perivesicular hazy stranding as well as delayed excretion of the kidneys, possibly related to chronic outlet obstruction though should correlate with urinalysis to exclude cystitis and ascending tract infection. 7. Aortic Atherosclerosis (ICD10-I70.0). These results were called by telephone at the time of interpretation on 04/28/2020 at 11:33 pm to provider Fredia Sorrow , who verbally acknowledged these results. Electronically Signed  By: Lovena Le M.D.   On: 04/28/2020 23:34   US Abdomen Limited RUQ  Result Date: 04/29/2020 CLINICAL DATA:  Right upper quadrant pain EXAM: ULTRASOUND ABDOMEN LIMITED RIGHT UPPER QUADRANT COMPARISON:  CT abdomen pelvis 04/28/2020 FINDINGS: Gallbladder: Gallbladder contains hypoechoic biliary sludge. There is mild gallbladder wall thickening of 4 mm. Pericholecystic fluid is noted. Sonographic Percell Miller sign is reportedly positive. Common bile duct: Diameter: 6 mm Liver: There is redemonstration of the extensive intrahepatic biliary ductal  dilatation throughout the right lobe liver seen on comparison CT. Corresponding to the several of the more ill-defined hypoattenuating foci seen on CT are more cystic appearing lesions including lesions in the anterior right lobe liver measuring 1.8 x 1.4 x 2.2 cm and 2.3 x 1.8 x 3.2 cm as well as a more complex posterior cystic structure measuring 2.8 x 1.9 x 2.5 cm with low level internal echoes and internal debris. Portal vein is patent on color Doppler imaging with normal direction of blood flow towards the liver. Other: None. IMPRESSION: Sonographic features are suggestive of acute cholecystitis in the appropriate clinical setting. Redemonstration of the extensive intrahepatic biliary ductal dilatation throughout the right lobe liver. With central occlusion presumably from patient's known cholangiocarcinoma. Ill-defined hypoattenuating lesions CT appear to correspond to numerous cystic foci throughout the right liver, new since April of 2021. In the setting of known cholangiocarcinoma, these findings could reflect the development of biloma as extension of the cholangiocarcinoma process. However, given the presence of fever and concern for sepsis, superinfection or formation of de Novo abscesses are not excluded particularly given the more ill-defined margins of these lesions on the CT imaging. Consider further evaluation with contrast-enhanced MRI following patient stabilization. These results were called by telephone at the time of interpretation on 04/29/2020 at 1:03 am to provider Ripley Fraise , who verbally acknowledged these results. Electronically Signed   By: Lovena Le M.D.   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 Georgette Dover.  Most likely  will require IR intervention, and ongoing conservative treatment.   Earnstine Regal [facility] 04/29/2020, 10:24 AM Please see Amion for pager number during day hours 7:00am-4:30pm

## 2020-04-29 NOTE — ED Notes (Signed)
Pt able to follow instructions, no noted transfusion reaction noted.  No hives or uticaria.

## 2020-04-29 NOTE — ED Notes (Signed)
MD notified of continued hypotension.  BP manually correlates currently with automatic.  92/40

## 2020-04-29 NOTE — Progress Notes (Signed)
PHARMACY - PHYSICIAN COMMUNICATION CRITICAL VALUE ALERT - BLOOD CULTURE IDENTIFICATION (BCID)  Rodney Romero. is an 74 y.o. male who presented to [facility] on 04/28/2020 with a chief complaint of abdominal pain.  Name of physician (or Provider) Contacted: Malachy Moan. Grandville Silos, MD  Current antibiotics:  - Cefepime 2 g IV every 12 hours - Metronidazole 500 mg IV every 8 hours  Changes to prescribed antibiotics recommended:  Recommendations declined by provider due to MD wanted to look further into antibiotics choices due to patient's complicated GI issues  Results for orders placed or performed during the hospital encounter of 04/28/20  Blood Culture ID Panel (Reflexed) (Collected: 04/28/2020 11:54 PM)  Result Value Ref Range   Enterococcus species NOT DETECTED NOT DETECTED   Vancomycin resistance NOT DETECTED NOT DETECTED   Listeria monocytogenes NOT DETECTED NOT DETECTED   Staphylococcus species NOT DETECTED NOT DETECTED   Staphylococcus aureus (BCID) NOT DETECTED NOT DETECTED   Streptococcus species NOT DETECTED NOT DETECTED   Streptococcus agalactiae NOT DETECTED NOT DETECTED   Streptococcus pneumoniae NOT DETECTED NOT DETECTED   Streptococcus pyogenes NOT DETECTED NOT DETECTED   Acinetobacter baumannii NOT DETECTED NOT DETECTED   Enterobacteriaceae species DETECTED (A) NOT DETECTED   Enterobacter cloacae complex NOT DETECTED NOT DETECTED   Escherichia coli NOT DETECTED NOT DETECTED   Klebsiella oxytoca NOT DETECTED NOT DETECTED   Klebsiella pneumoniae DETECTED (A) NOT DETECTED   Proteus species NOT DETECTED NOT DETECTED   Serratia marcescens NOT DETECTED NOT DETECTED   Carbapenem resistance NOT DETECTED NOT DETECTED   Haemophilus influenzae NOT DETECTED NOT DETECTED   Neisseria meningitidis NOT DETECTED NOT DETECTED   Pseudomonas aeruginosa NOT DETECTED NOT DETECTED   Candida albicans NOT DETECTED NOT DETECTED   Candida glabrata NOT DETECTED NOT DETECTED   Candida  krusei NOT DETECTED NOT DETECTED   Candida parapsilosis NOT DETECTED NOT DETECTED   Candida tropicalis NOT DETECTED NOT DETECTED    Heinz Eckert L Gloyd Happ 04/29/2020  5:08 PM

## 2020-04-29 NOTE — Consult Note (Signed)
Chief Complaint: Patient was seen in consultation today for acute cholecystitis/liver abscess/hilar cholangiocarcinoma  Referring Physician(s): Earnstine Regal, PA-C  Supervising Physician: Corrie Mckusick  Patient Status: [facility] - ED  History of Present Illness: Rodney Stefanovic. is a 74 y.o. male with a past medical history significant for bipolar 1 disorder, PTSD, hypothyroidism, HTN and hilar cholangiocarcinoma currently receiving treatment at [facility]) who presented to the ED yesterday with complaints of severe abdominal pain x 1 day. Initial workup in the ED showed hypotension, leukocytosis (12.5), hgb 7.7, plt 245, AST 105, ALT 110, ALP 542, t.bili 1.8, INR 1.3. CT A/P w/contrast was obtained which noted several abnormalities including circumferentially thickened gallbladder wall with adjacent pericholecystic hazy stranding suspicious for acute cholecystitis, prior percutaneous biliary drain which no longer continues to the skin surface and terminating in the muscle belly of the superior aspect of the rectus sheath with a small fluid and air containing collection adjacent the tip contiguous with the superior belly of the rectus muscle and hepatic capsule concerning for abscess. RUQ US showed sonographic features suggestive of acute cholecystitis and multiple hepatic lesions. Above findings were discussed with the on-call oncologist at [facility] by ED physician and transfer was considered however no bed available so decision was made to admit to [facility] and start empiric IV antibiotics. He was seen by general surgery who has requested IR evaluate patient for possible percutaneous cholecystostomy.   Of note, patient previously seen in IR here at [facility] for brush biopsy of the proximal CBD obstructing lesion and placement of internal/external biliary drain catheter on 02/06/20 with Dr. Vernard Gambles. His internal/external biliary drain was exchanged on 03/10/20 by Dr. Earleen Newport. On presentation  today the external portion of his drain has been cut at the skin and the insertion site is completely healed over.  Patient seen in ED with Dr. Earleen Newport, he is alert and able to tell [place] his name, birthday, year and that he is at [facility]. He complains of significant abdominal pain and is requesting to use a urinal. He is unsure of when he last ate but knows it has not been today. He reports that his biliary drain was cut at [facility] but he is not sure when or why. We discussed that he is very sick and that it is imperative that we try to control the infection before it worsens, however he has several potential infectious sources and likely will need several drains placed including a percutaneous cholecystostomy, possibly liver abscess drain and possible internal/external biliary drain - he states understanding to all of this and is agreeable to proceed but asks that his wife sign his consent form. Discussed above findings and plan with patient's wife Rodney Romero today via phone who gives consent for these procedures - she asks that these procedures be discussed with his care team at [facility] but is aware that we will need to proceed with drain placement first.   Past Medical History:  Diagnosis Date   Bipolar 1 disorder ([place])    Cancer ([place])    Hypertension    Hypothyroidism    PTSD (post-traumatic stress disorder)     Past Surgical History:  Procedure Laterality Date   ERCP N/A 02/05/2020   Procedure: ENDOSCOPIC RETROGRADE CHOLANGIOPANCREATOGRAPHY (ERCP);  Surgeon: Clarene Essex, MD;  Location: [place];  Service: Endoscopy;  Laterality: N/A;   GALLBLADDER SURGERY     IR ENDOLUMINAL BX OF BILIARY TREE  02/06/2020   IR EXCHANGE BILIARY DRAIN  03/10/2020  IR INT EXT BILIARY DRAIN WITH CHOLANGIOGRAM  02/06/2020   SPHINCTEROTOMY  02/05/2020   Procedure: SPHINCTEROTOMY;  Surgeon: Clarene Essex, MD;  Location: [place];  Service: Endoscopy;;   TIBIA IM NAIL INSERTION Left 06/26/2018   Procedure:  left tibia intramedullary nail;  Surgeon: Gaynelle Arabian, MD;  Location: [facility];  Service: Orthopedics;  Laterality: Left;    Allergies: Patient has no known allergies.  Medications: Prior to Admission medications   Medication Sig Start Date End Date Taking? Authorizing Provider  amLODipine (NORVASC) 10 MG tablet Take 10 mg by mouth daily.   Yes [provider]  aspirin EC 81 MG tablet Take 81 mg by mouth daily.   Yes [provider]  fluticasone (FLONASE) 50 MCG/ACT nasal spray Place 1 spray into both nostrils daily.   Yes [provider]  levothyroxine (SYNTHROID) 150 MCG tablet Take 150 mcg by mouth daily before breakfast.   Yes [provider]  loratadine (CLARITIN) 10 MG tablet Take 10 mg by mouth daily.   Yes [provider]  mirtazapine (REMERON) 15 MG tablet Take 1 tablet (15 mg total) by mouth at bedtime. 03/22/20  Yes Mercy Riding, MD  Multiple Vitamins-Minerals (MULTIVITAMIN WITH MINERALS) tablet Take 1 tablet by mouth daily.   Yes [provider]  omeprazole (PRILOSEC) 20 MG capsule Take 20 mg by mouth daily.   Yes [provider]  ondansetron (ZOFRAN) 4 MG tablet Take 8 mg by mouth every 8 (eight) hours as needed for nausea or vomiting.  04/26/20  Yes [provider]  oxyCODONE (OXY IR/ROXICODONE) 5 MG immediate release tablet Take 5 mg by mouth 3 (three) times daily as needed for moderate pain.  04/12/20  Yes [provider]     Family History  Problem Relation Age of Onset   Hypertension Mother    Diabetes Mother    Seizures Father     Social History   Socioeconomic History   Marital status: Married    Spouse name: Not on file   Number of children: Not on file   Years of education: Not on file   Highest education level: Not on file  Occupational History   Not on file  Tobacco Use   Smoking status: Former Smoker    Packs/day: 0.50    Types: Cigarettes    Quit date: 09/03/2014     Years since quitting: 5.6   Smokeless tobacco: Never Used  Substance and Sexual Activity   Alcohol use: Not Currently   Drug use: No   Sexual activity: Not Currently    Birth control/protection: None  Other Topics Concern   Not on file  Social History Narrative   Not on file   Social Determinants of Health   Financial Resource Strain:    Difficulty of Paying Living Expenses:   Food Insecurity:    Worried About Charity fundraiser in the Last Year:    Arboriculturist in the Last Year:   Transportation Needs:    Film/video editor (Medical):    Lack of Transportation (Non-Medical):   Physical Activity:    Days of Exercise per Week:    Minutes of Exercise per Session:   Stress:    Feeling of Stress :   Social Connections:    Frequency of Communication with Friends and Family:    Frequency of Social Gatherings with Friends and Family:    Attends Religious Services:    Active Member of Clubs or Organizations:  Attends Archivist Meetings:    Marital Status:      Review of Systems: A 12 point ROS discussed and pertinent positives are indicated in the HPI above.  All other systems are negative.  Review of Systems  Constitutional: Positive for appetite change, chills, fatigue and fever.       Limited ROS due to patient in acute distress  Respiratory: Positive for shortness of breath. Negative for cough.   Cardiovascular: Negative for chest pain.  Gastrointestinal: Positive for abdominal pain and nausea. Negative for vomiting.    Vital Signs: BP (!) 95/58    Pulse 79    Temp (!) 100.7 F (38.2 C) (Oral)    Resp (!) 23    SpO2 (!) 66%   Physical Exam Vitals and nursing note reviewed.  Constitutional:      General: He is in acute distress.     Appearance: He is ill-appearing.     Comments: Cachectic appearing AAM initially laying in fetal position on left side, bed in reverse trendelenburg, soft mittens in place, PRBCs hanging. No  family or staff at bedside.  HENT:     Mouth/Throat:     Mouth: Mucous membranes are moist.     Pharynx: Oropharynx is clear. No oropharyngeal exudate or posterior oropharyngeal erythema.  Cardiovascular:     Rate and Rhythm: Regular rhythm. Tachycardia present.     Comments: (+) port accessed but nothing infusing - NS infusing via right upper extremity peripheral IV Pulmonary:     Effort: Pulmonary effort is normal.     Comments: Decreased breath sounds bilaterally - anterior exam only. 2L/min supplemental oxygen via [place], SpO2 92% during exam. Abdominal:     General: There is distension.     Tenderness: There is abdominal tenderness. There is guarding.  Skin:    General: Skin is warm and dry.  Neurological:     Mental Status: He is alert and oriented to person, place, and time.      MD Evaluation Airway: WNL(2L/min supplemental oxygen) Heart: WNL Abdomen: WNL Chest/ Lungs: WNL ASA  Classification: 3, 4 Mallampati/Airway Score: Two(edentulous)   Imaging: CT Abdomen Pelvis W Contrast  Result Date: 04/28/2020 CLINICAL DATA:  Known cholangiocarcinoma, right upper quadrant pain EXAM: CT ABDOMEN AND PELVIS WITH CONTRAST TECHNIQUE: Multidetector CT imaging of the abdomen and pelvis was performed using the standard protocol following bolus administration of intravenous contrast. CONTRAST:  152mL OMNIPAQUE IOHEXOL 300 MG/ML  SOLN COMPARISON:  MR abdomen 03/20/2020, CT 03/05/2020 FINDINGS: Lower chest: Bibasilar atelectasis with more dense bandlike areas of opacity in the right lung base likely reflecting subsegmental atelectatic changes well. No visible pulmonary nodules mild cardiomegaly. No pericardial effusion. Distal thoracic aortic atherosclerosis. Hepatobiliary: There are multiple targetoid lesions with central hypoattenuation and peripheral rims of more intermediate attenuation throughout the all segments of the right lobe liver. Some associated capsular retraction is seen along the  anterior right lobe (8/11) marked intrahepatic biliary ductal dilatation is noted throughout the right liver as well. This is despite the presence of an internal biliary drain originating in the main hepatic duct in terminating in the duodenum. A prior percutaneous biliary drain no longer continues to the skin surface, terminating in the muscle belly of the superior aspect rectus sheath (8/5). There is a small fluid and gas containing collection measuring approximately 2.1 x 2.3 cm which is contiguous between the muscle belly and hepatic capsule. Catheter seen terminating distally in the common bile duct. Ill-defined hypoattenuation about the drainage  catheters and the central right hepatic duct is noted as well (8/16). Much of the left lobe appears unaffected by these acute changes the gallbladder is circumferentially thickened with adjacent pericholecystic hazy stranding. No visible intraductal gallstones. Pancreas: Unremarkable. No pancreatic ductal dilatation or surrounding inflammatory changes. Spleen: Normal in size without focal abnormality. Adrenals/Urinary Tract: Normal adrenal glands. There is symmetric renal enhancement and slightly delayed excretion bilaterally on more delayed phase imaging. Multiple clustered cysts again seen in the lower pole right kidney including several layering milk of calcium cysts. As well as an additional fluid attenuation cyst in the lower pole left kidney all appear grossly unchanged or diminished in size from the comparison exam. No new concerning renal lesion. No urolithiasis or hydronephrosis. There is circumferential bladder wall thickening. Indentation of the bladder base by the enlarged prostate. Stomach/Bowel: Distal esophagus, stomach and duodenal sweep are unremarkable. No small bowel wall thickening or dilatation. No evidence of obstruction. Air-filled appendix in the right lower quadrant. No colonic dilatation or wall thickening. Vascular/Lymphatic: Atherosclerotic  calcifications throughout the abdominal aorta and branch vessels. No aneurysm or ectasia. No enlarged abdominopelvic lymph nodes. Reproductive: Prostatomegaly with enhancing nodule in the periphery near the base of the gland (3/71) indeterminate. Other: Mild circumferential body wall edema. Small volume low-attenuation free fluid in the deep pelvis. No abdominopelvic free air. No bowel containing hernias. Musculoskeletal: Multilevel degenerative changes are present in the imaged portions of the spine. No acute osseous abnormality or suspicious osseous lesion. IMPRESSION: 1. Ill-defined region of hypoattenuation centered upon the central liver and traversed by the multiple drainage catheters compatible with site of primary cholangiocarcinoma. 2. Multiple targetoid lesions throughout the all segments of the right lobe liver with central hypoattenuation and peripheral rims of more intermediate attenuation. Findings are compatible with metastatic disease. 3. A prior percutaneous biliary drain appears abandoned, no longer continuing to the skin surface, terminating in the muscle belly of the superior aspect of the rectus sheath with a small fluid and air containing collection adjacent the tip contiguous with the superior belly of the rectus muscle and hepatic capsule. Could reflect drainage about catheter tip a an abscess is difficult to exclude. 4. Circumferentially thickened gallbladder wall with adjacent pericholecystic hazy stranding, suspicious for acute cholecystitis. Recommend correlation with right upper quadrant ultrasound. 5. Prostatomegaly with enhancing nodule in the periphery near the base of the gland indeterminate. Recommend correlation with PSA and consider further evaluation with dedicated prostate imaging. 6. Circumferential bladder wall thickening with faint perivesicular hazy stranding as well as delayed excretion of the kidneys, possibly related to chronic outlet obstruction though should correlate  with urinalysis to exclude cystitis and ascending tract infection. 7. Aortic Atherosclerosis (ICD10-I70.0). These results were called by telephone at the time of interpretation on 04/28/2020 at 11:33 pm to provider Fredia Sorrow , who verbally acknowledged these results. Electronically Signed   By: Lovena Le M.D.   On: 04/28/2020 23:34   US Abdomen Limited RUQ  Result Date: 04/29/2020 CLINICAL DATA:  Right upper quadrant pain EXAM: ULTRASOUND ABDOMEN LIMITED RIGHT UPPER QUADRANT COMPARISON:  CT abdomen pelvis 04/28/2020 FINDINGS: Gallbladder: Gallbladder contains hypoechoic biliary sludge. There is mild gallbladder wall thickening of 4 mm. Pericholecystic fluid is noted. Sonographic Percell Miller sign is reportedly positive. Common bile duct: Diameter: 6 mm Liver: There is redemonstration of the extensive intrahepatic biliary ductal dilatation throughout the right lobe liver seen on comparison CT. Corresponding to the several of the more ill-defined hypoattenuating foci seen on CT are more cystic appearing  lesions including lesions in the anterior right lobe liver measuring 1.8 x 1.4 x 2.2 cm and 2.3 x 1.8 x 3.2 cm as well as a more complex posterior cystic structure measuring 2.8 x 1.9 x 2.5 cm with low level internal echoes and internal debris. Portal vein is patent on color Doppler imaging with normal direction of blood flow towards the liver. Other: None. IMPRESSION: Sonographic features are suggestive of acute cholecystitis in the appropriate clinical setting. Redemonstration of the extensive intrahepatic biliary ductal dilatation throughout the right lobe liver. With central occlusion presumably from patient's known cholangiocarcinoma. Ill-defined hypoattenuating lesions CT appear to correspond to numerous cystic foci throughout the right liver, new since April of 2021. In the setting of known cholangiocarcinoma, these findings could reflect the development of biloma as extension of the cholangiocarcinoma  process. However, given the presence of fever and concern for sepsis, superinfection or formation of de Novo abscesses are not excluded particularly given the more ill-defined margins of these lesions on the CT imaging. Consider further evaluation with contrast-enhanced MRI following patient stabilization. These results were called by telephone at the time of interpretation on 04/29/2020 at 1:03 am to provider Ripley Fraise , who verbally acknowledged these results. Electronically Signed   By: Lovena Le M.D.   On: 04/29/2020 00:58    Labs:  CBC: Recent Labs    03/22/20 0419 04/28/20 1608 04/29/20 0255 04/29/20 0841  WBC 11.3* 12.5* 10.5 5.8  HGB 12.0* 7.7* 7.1* 5.7*  HCT 35.7* 22.7* 20.9* 16.7*  PLT 408* 245 195 146*    COAGS: Recent Labs    02/02/20 2143 02/03/20 0325 02/04/20 0808 04/28/20 2334  INR 1.2 1.3* 1.2 1.3*    BMP: Recent Labs    03/21/20 0444 03/22/20 0419 04/28/20 1608 04/29/20 0255  NA 131* 133* 130* 132*  K 4.4 3.5 3.8 3.8  CL 101 100 95* 99  CO2 24 23 24 24   GLUCOSE 98 82 118* 117*  BUN 20 10 17 16   CALCIUM 9.0 8.8* 8.8* 8.4*  CREATININE 0.90 0.66 0.82 0.97  GFRNONAA >60 >60 >60 >60  GFRAA >60 >60 >60 >60    LIVER FUNCTION TESTS: Recent Labs    03/20/20 0525 03/20/20 0839 03/21/20 0444 03/22/20 0419 04/28/20 1608 04/29/20 0255  BILITOT 1.0  --  1.0  --  1.8* 1.7*  AST 22  --  20  --  105* 189*  ALT 36  --  26  --  110* 151*  ALKPHOS 203*  --  150*  --  542* 442*  PROT 8.9*  --  7.3  --  6.5 5.9*  ALBUMIN 4.2   4.1   < > 3.2* 2.8* 2.3* 2.0*   < > = values in this interval not displayed.    TUMOR MARKERS: No results for input(s): AFPTM, CEA, CA199, CHROMGRNA in the last 8760 hours.  Assessment and Plan:  74 y/o M with history of hilar cholangiocarcinoma currently receiving treatment at [facility] who presented to [facility] ED yesterday with complaints of worsening abdominal pain. He was found to be septic with possible  acute cholecystitis and likely liver abscess(es). Additional the internal/external biliary drain that was placed in IR was cut at the skin level with retained catheter in the rectus muscle per CT A/P causing abscess formation at the tip of the catheter as well as biliary dysfunction. Upon review of Care Everywhere it is unclear when this drain was cut however CT chest/abd/pelvis was performed on 04/22/20  which noted the presence of the external/internal biliary drain - per patient report this was cut at [facility] but he is not sure when or by what service. His wife is not aware that the drain had been cut at all.   Patient reviewed by Dr. Earleen Newport who agrees to proceed with percutaneous cholecystostomy placement as well as possible liver abscess drain(s) and internal/external biliary drain placement. This was discussed with patient and his wife who are both in agreement. Plan to proceed with urgent placement today in IR. Tmax 102.8, WBC 5.8, hgb 5.7 (no post transfusion H/H noted), plt 146, INR 1.3. Currently receiving cefepime and flagyl per primary team - will not add additional surgical prophylaxis antibiotic coverage at this time.  Risks and benefits discussed with the patient and his wife Rodney Romero via phone including, but not limited to bleeding, infection, damage to adjacent structures, bowel perforation/fistula connection,gallbladder perforation, bile leak, sepsis or even death.  All of the patient and his wife's questions were answered, patient and his wife are agreeable to proceed.  Consent signed and in IR control room.  Thank you for this interesting consult.  I greatly enjoyed meeting Rodney Romero. and look forward to participating in their care.  A copy of this report was sent to the requesting provider on this date.  Electronically Signed: Joaquim Nam, PA-C 04/29/2020, 2:54 PM   I spent a total of 87 [place] face to face in clinical consultation, greater than 50% of which  was counseling/coordinating care for percutaneous cholecystostomy placement, internal/external biliary drain placement, possible liver abscess drain(s) placement.

## 2020-04-29 NOTE — ED Notes (Signed)
MD Grandville Silos at bedside to assess patient

## 2020-04-29 NOTE — Sedation Documentation (Signed)
ED Provider at bedside. 

## 2020-04-29 NOTE — Consult Note (Signed)
[place] PCCM Consult Note  NAME:  Rodney Delanoy., MRN:  [id number], DOB:  11-Jan-1946, LOS: 0 ADMISSION DATE:  04/28/2020, CONSULTATION DATE: 5/27 REFERRING MD: Dr. Grandville Silos, CHIEF COMPLAINT: Abdominal pain  Brief History   74 year old male with recent diagnosis of cholangiocarcinoma now admitted with acute cholecystitis and found to have bacteremia.  He became hypotensive prompting PCCM consult.  History of present illness   74 year old male with past medical history as below, which is significant for COPD, alcohol abuse, bipolar 1 disorder, hypertension, hypothyroidism, and a recent diagnosis of stage IIIb cholangiocarcinoma.  He was admitted to [facility] on April 16 for acute cholecystitis at which time cholangiocarcinoma was diagnosed.  He receives his oncology care through the [facility] system.  His chemotherapy regimen includes cisplatin and gemcitabine.  He has been deemed not a surgical candidate.  He presents again to [facility] emergency department on 5/26 with complaints of ongoing abdominal pain, nausea, and poor appetite.  Work-up in the emergency department included CT scan of the abdomen which was concerning once again for acute cholecystitis.  He was admitted to the hospitalist service and initiated on empiric antibiotics.  There was an attempt made for transfer to [facility], however, no beds were available at that time.  General surgery was consulted for potential surgical options and have recommended percutaneous drain placement.  He was taken to interventional radiology on 5/27 for drain placement.  Later that evening he became hypotensive.  He was also found to be anemic and transfused 2 units PRBC, however, he remained hypotensive.  PCCM was consulted.  Past Medical History   has a past medical history of Bipolar 1 disorder ([place]), Cancer ([place]), Hypertension, Hypothyroidism, and PTSD (post-traumatic stress disorder).   Significant Hospital  Events   5/26 admit 5/27 transfer to ICU for hypotension  Consults:  Surgery IR PCCM  Procedures:  Biliary drain 5/27 >  Significant Diagnostic Tests:  CT abd/pelvis 5/26 > hypoattenuation upon the central liver compatible with cholangiocarcinoma.  Evidence of metastatic disease to the liver.  Abandoned prior percutaneous biliary drain.  Circumferentially thickened gallbladder consistent with acute cholecystitis.  Circumferential bladder wall thickening.  Prostatomegaly with enhancing nodule. Right upper quadrant ultrasound 5/27> sonographic features suggestive of acute cholecystitis.  Extensive intrahepatic biliary ductal dilation.  Micro Data:  BC ID Kelbsiella [id number] 5/26 > Surgical 5/27 > Surgical 5/27 >   Antimicrobials:  CTX 5/26 > Flagyl 5/26 >  Interim history/subjective:    Objective   Blood pressure 92/61, pulse (!) 113, temperature 99.8 F (37.7 C), temperature source Oral, resp. rate (!) 25, SpO2 100 %.        Intake/Output Summary (Last 24 hours) at 04/29/2020 1941 Last data filed at 04/29/2020 1908 Gross per 24 hour  Intake 2065 ml  Output --  Net 2065 ml   There were no vitals filed for this visit.  Examination: General: frail elderly male in NAD HENT: [place]/AT, PERRL, no JVD. Mucous membranes dry.  Lungs: Clear Cardiovascular: RRR, no MRG Abdomen: Soft, diffusely tender. Biliary drains.  Extremities: No acute deformity. Very thin.  Neuro: Alert, oriented. Intermittent confusion.   Resolved Hospital Problem list     Assessment & Plan:   Septic shock: sepsis present on arrival Acute cholecystis  Klebsiella bacteremia  - Admit to ICU - IVF resuscitate further - norepinephrine infusion to keep MAP > 73mmHg - Continue CTX, flagyl - Biliary drains per IR - Await cultures  Metastatic cholangiocarcinoma  -  Supportive Care - Transfer to [facility] pending.   Anemia: secondary chemotherapy. No obvious bleeding.  - s/p 2 units PRBC in ED - Trend  Hgb, transfuse for < 7.   Elevated transaminases: secondary to cholangiocarcinoma, shock.  - Trend  Hypothyroid - continue IV synthroid  Best practice:  Diet: NPO Pain/Anxiety/Delirium protocol (if indicated): NA VAP protocol (if indicated): NA DVT prophylaxis: SQH GI prophylaxis: NA Glucose control: Na Mobility: Bedrest Code Status: FULL Family Communication: patient updated. I have made several attempts to call his wife without success.  Disposition: ICU  Labs   CBC: Recent Labs  Lab 04/28/20 1608 04/29/20 0255 04/29/20 0841  WBC 12.5* 10.5 5.8  NEUTROABS  --  9.2* 5.3  HGB 7.7* 7.1* 5.7*  HCT 22.7* 20.9* 16.7*  MCV 68.4* 69.2* 69.0*  PLT 245 195 146*    Basic Metabolic Panel: Recent Labs  Lab 04/28/20 1608 04/29/20 0255  NA 130* 132*  K 3.8 3.8  CL 95* 99  CO2 24 24  GLUCOSE 118* 117*  BUN 17 16  CREATININE 0.82 0.97  CALCIUM 8.8* 8.4*   GFR: Estimated Creatinine Clearance: 50.3 mL/min (by C-G formula based on SCr of 0.97 mg/dL). Recent Labs  Lab 04/28/20 1608 04/28/20 2334 04/29/20 0134 04/29/20 0255 04/29/20 0841  WBC 12.5*  --   --  10.5 5.8  LATICACIDVEN  --  1.3 1.8  --   --     Liver Function Tests: Recent Labs  Lab 04/28/20 1608 04/29/20 0255  AST 105* 189*  ALT 110* 151*  ALKPHOS 542* 442*  BILITOT 1.8* 1.7*  PROT 6.5 5.9*  ALBUMIN 2.3* 2.0*   Recent Labs  Lab 04/28/20 1608  LIPASE 23   No results for input(s): AMMONIA in the last 168 hours.  ABG    Component Value Date/Time   TCO2 28 04/04/2012 2158     Coagulation Profile: Recent Labs  Lab 04/28/20 2334  INR 1.3*    Cardiac Enzymes: No results for input(s): CKTOTAL, CKMB, CKMBINDEX, TROPONINI in the last 168 hours.  HbA1C: No results found for: HGBA1C  CBG: Recent Labs  Lab 04/29/20 0611 04/29/20 0706 04/29/20 0856 04/29/20 1435 04/29/20 1840  GLUCAP 63* 154* 86 76 88    Review of Systems:   Bolds are positive  Constitutional: weight loss,  gain, night sweats, Fevers, chills, fatigue .  HEENT: headaches, Sore throat, sneezing, nasal congestion, post nasal drip, Difficulty swallowing, Tooth/dental problems, visual complaints visual changes, ear ache CV:  chest pain, radiates:,Orthopnea, PND, swelling in lower extremities, dizziness, palpitations, syncope.  GI  heartburn, indigestion, abdominal pain, nausea, vomiting, diarrhea, change in bowel habits, loss of appetite, bloody stools.  Resp: cough, productive:, hemoptysis, dyspnea, chest pain, pleuritic.  Skin: rash or itching or icterus GU: dysuria, change in color of urine, urgency or frequency. flank pain, hematuria  MS: joint pain or swelling. decreased range of motion  Psych: change in mood or affect. depression or anxiety.  Neuro: difficulty with speech, weakness, numbness, ataxia    Past Medical History  He,  has a past medical history of Bipolar 1 disorder ([place]), Cancer ([place]), Hypertension, Hypothyroidism, and PTSD (post-traumatic stress disorder).   Surgical History    Past Surgical History:  Procedure Laterality Date  . ERCP N/A 02/05/2020   Procedure: ENDOSCOPIC RETROGRADE CHOLANGIOPANCREATOGRAPHY (ERCP);  Surgeon: Clarene Essex, MD;  Location: [place];  Service: Endoscopy;  Laterality: N/A;  . GALLBLADDER SURGERY    . IR ENDOLUMINAL BX OF BILIARY TREE  02/06/2020  .  IR EXCHANGE BILIARY DRAIN  03/10/2020  . IR INT EXT BILIARY DRAIN WITH CHOLANGIOGRAM  02/06/2020  . IR INT EXT BILIARY DRAIN WITH CHOLANGIOGRAM  04/29/2020  . IR PERC CHOLECYSTOSTOMY  04/29/2020  . SPHINCTEROTOMY  02/05/2020   Procedure: SPHINCTEROTOMY;  Surgeon: Clarene Essex, MD;  Location: [facility] ENDOSCOPY;  Service: Endoscopy;;  . TIBIA IM NAIL INSERTION Left 06/26/2018   Procedure: left tibia intramedullary nail;  Surgeon: Gaynelle Arabian, MD;  Location: [facility] ORS;  Service: Orthopedics;  Laterality: Left;     Social History   reports that he quit smoking about 5 years ago. His smoking use included cigarettes. He  smoked 0.50 packs per day. He has never used smokeless tobacco. He reports previous alcohol use. He reports that he does not use drugs.   Family History   His family history includes Diabetes in his mother; Hypertension in his mother; Seizures in his father.   Allergies No Known Allergies   Home Medications  Prior to Admission medications   Medication Sig Start Date End Date Taking? Authorizing Provider  amLODipine (NORVASC) 10 MG tablet Take 10 mg by mouth daily.   Yes [provider]  aspirin EC 81 MG tablet Take 81 mg by mouth daily.   Yes [provider]  fluticasone (FLONASE) 50 MCG/ACT nasal spray Place 1 spray into both nostrils daily.   Yes [provider]  levothyroxine (SYNTHROID) 150 MCG tablet Take 150 mcg by mouth daily before breakfast.   Yes [provider]  loratadine (CLARITIN) 10 MG tablet Take 10 mg by mouth daily.   Yes [provider]  mirtazapine (REMERON) 15 MG tablet Take 1 tablet (15 mg total) by mouth at bedtime. 03/22/20  Yes Mercy Riding, MD  Multiple Vitamins-Minerals (MULTIVITAMIN WITH MINERALS) tablet Take 1 tablet by mouth daily.   Yes [provider]  omeprazole (PRILOSEC) 20 MG capsule Take 20 mg by mouth daily.   Yes [provider]  ondansetron (ZOFRAN) 4 MG tablet Take 8 mg by mouth every 8 (eight) hours as needed for nausea or vomiting.  04/26/20  Yes [provider]  oxyCODONE (OXY IR/ROXICODONE) 5 MG immediate release tablet Take 5 mg by mouth 3 (three) times daily as needed for moderate pain.  04/12/20  Yes [provider]     Critical care time: 50 minutes       Georgann Housekeeper, AGACNP-BC [place] for personal pager PCCM on call pager [phone]  04/29/2020 8:12 PM

## 2020-04-29 NOTE — Progress Notes (Addendum)
PROGRESS NOTE    Rodney Romero.  [id number] DOB: January 27, 1946 DOA: 04/28/2020 PCP: [place]    Chief Complaint  Patient presents with  . Abdominal Pain    Brief Narrative:  HPI per Dr. Noni Saupe. is a 74 y.o. 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  Pain has been persistent.  ED Course: In the ER patient underwent CT abdomen pelvis and sonogram of the abdomen which showed features concerning for acute cholecystitis.  Labs show patient has worsening hemoglobin when compared to recent past but when compared to last week done at [facility] it is dropped by 1 g.  LFTs are elevated at AST 189 ALT 151 total bilirubin 1.7.  ER physician discussed with on-call oncologist at [facility] about transfer but since there was no bed available patient started on empiric antibiotics admitted at [facility].  Covid test was negative.  EKG shows normal sinus rhythm.   Assessment & Plan:   Principal Problem:   Acute cholecystitis Active Problems:   Essential hypertension   Hypothyroidism   Anemia  1 probable evolving sepsis secondary to acute cholecystitis with acute organ dysfunction with hypotension Patient presented with 3 to 4-day history of worsening right upper quadrant abdominal pain concerning for acute cholecystitis.  Patient noted to have a transaminitis, elevated total bilirubin of 1.7.  CT abdomen and pelvis as well as abdominal ultrasound with features consistent with acute cholecystitis.  Patient with recent history of cholangiocarcinoma being followed at [facility] and currently on  chemotherapy.  Patient noted to have been recently admitted at [facility] in April 2021 for acute cholecystitis and underwent conservative treatment with percutaneous drain placement which supposedly was eventually removed.  Patient noted to be hypotensive in the ED with systolic blood pressures ranging from the 70s to the 90s.  Patient also noted to have a fever with temps as high as 102.8.  Lactic acid level noted at 1.8.  Repeat lactic acid level in the morning.  Also noted to be tachycardic.  Patient has been pancultured.  IV fluid bolus.  Gentle hydration.  Continue empiric IV cefepime and IV Flagyl.  Consult with general surgery for further evaluation and recommendations. It was noted per admitting physician that ER physician discussed with on-call oncologist Dr.Lynca at [facility] who was agreeable to accept the patient however no beds were available and as such patient admitted to [place] current care.  General surgery consulted.  2.  Hypotension Felt likely secondary to problem #1 versus hypovolemic versus secondary to anemia.  Patient noted to be anemic likely secondary to recent chemotherapy.  Patient with no overt bleeding.  Fluid bolus administered this morning.  Place on IV fluids.  Repeat CBC done with a hemoglobin down to 5.7.  Transfuse 2 units packed red blood cells.  Patient pancultured.  Continue empiric IV antibiotics.  Follow.  3.  Anemia likely secondary to chemotherapy Patient noted to be anemic on admission with recent chemotherapy.  Repeat CBC with hemoglobin down to 5.7 from 7.7 on admission.  Hemoglobin noted to be 12.0 on 03/22/2020.  Patient with no overt bleeding.  Check FOBT.  Check an anemia panel.  Transfused 2 units packed red blood cells.  Follow H&H.  4.  Hypertension Patient hypotensive.  Hold antihypertensive medications.  5.  Hypothyroidism Continue IV Synthroid.  25.  Hilar cholangiocarcinoma Being followed at [facility]  oncology.  Patient status post chemotherapy last week.  Will need to follow back with [facility] in the outpatient setting on discharge.  7.  Acute metabolic encephalopathy Patient noted to be drowsy, hypotensive.  Patient had received Dilaudid earlier on was given a dose of Narcan in the ED per ED physician.  Patient has been pancultured.  Patient on IV fluids.  Patient on empiric IV antibiotics for acute cholecystitis.  Repeat hemoglobin noted to be 5.7.  Patient being transfused 2 units packed red blood cells.  Check an ammonia level.  Follow.  Addendum 8.  Klebsiella pneumonia bacteremia Was called by pharmacy that [facility] ID was positive for Klebsiella pneumonia bacteremia.  IV cefepime was downgraded to IV Rocephin.  Continue IV Flagyl.   DVT prophylaxis: SCDs/patient anemic. Code Status: Full Family Communication: Tried calling wife to update however got voicemail. Disposition:   Status is: Inpatient    Dispo: The patient is from: Home              Anticipated d/c is to: To be determined.              Anticipated d/c date is: To be determined.              Patient currently with acute cholecystitis, borderline blood pressure, symptomatic anemia, on IV antibiotics, being transfused packed red blood cells.        Consultants:   General surgery pending 04/29/2020  Procedures:   CT abdomen and pelvis 04/28/2020  Right upper quadrant ultrasound 04/29/2020  Transfuse 2 units packed red blood cells pending 04/29/2020  Antimicrobials:   IV Flagyl 04/29/2020  IV cefepime 04/29/2020   Subjective: Patient drowsy.  Arousable.  Denies any chest pain.  Some complaints of abdominal pain.  Drifts back off to sleep.  Patient noted to have received Dilaudid this morning and noted to be hypotensive per RN early in the morning with systolic blood pressures ranging from the 70s to the 90s.  Per RN patient with no overt bleeding.  Objective: Vitals:   04/29/20 0756 04/29/20 0800 04/29/20  0810 04/29/20 0815  BP:  (!) 95/56 (!) 82/51 (!) 77/48  Pulse:   (!) 105 (!) 103  Resp:  (!) 24 20 20   Temp: (!) 102.8 F (39.3 C)     TempSrc: Rectal     SpO2:   94% 94%    Intake/Output Summary (Last 24 hours) at 04/29/2020 0951 Last data filed at 04/29/2020 0850 Gross per 24 hour  Intake 800 ml  Output --  Net 800 ml   There were no vitals filed for this visit.  Examination:  General exam: Drowsy Respiratory system: Clear to auscultation anterior lung fields. Respiratory effort normal. Cardiovascular system: S1 & S2 heard, RRR. No JVD, murmurs, rubs, gallops or clicks. No pedal edema. Gastrointestinal system: Abdomen is nondistended, soft and tenderness to palpation left upper quadrant, epigastrium, positive bowel sounds.  No rebound.  No guarding. Central nervous system: Drowsy.  Moving extremities spontaneously.  Extremities: Symmetric 5 x 5 power. Skin: No rashes, lesions or ulcers Psychiatry: Judgement and insight appear poor to fair.  Mood & affect unable to assess..     Data Reviewed: I have personally reviewed following  labs and imaging studies  CBC: Recent Labs  Lab 04/28/20 1608 04/29/20 0255 04/29/20 0841  WBC 12.5* 10.5 5.8  NEUTROABS  --  9.2* PENDING  HGB 7.7* 7.1* 5.7*  HCT 22.7* 20.9* 16.7*  MCV 68.4* 69.2* 69.0*  PLT 245 195 146*    Basic Metabolic Panel: Recent Labs  Lab 04/28/20 1608 04/29/20 0255  NA 130* 132*  K 3.8 3.8  CL 95* 99  CO2 24 24  GLUCOSE 118* 117*  BUN 17 16  CREATININE 0.82 0.97  CALCIUM 8.8* 8.4*    GFR: Estimated Creatinine Clearance: 50.3 mL/min (by C-G formula based on SCr of 0.97 mg/dL).  Liver Function Tests: Recent Labs  Lab 04/28/20 1608 04/29/20 0255  AST 105* 189*  ALT 110* 151*  ALKPHOS 542* 442*  BILITOT 1.8* 1.7*  PROT 6.5 5.9*  ALBUMIN 2.3* 2.0*    CBG: Recent Labs  Lab 04/29/20 0325 04/29/20 0611 04/29/20 0706 04/29/20 0856  GLUCAP 82 63* 154* 86     Recent Results (from the  past 240 hour(s))  SARS Coronavirus 2 by RT PCR (hospital order, performed in [facility] hospital lab) Nasopharyngeal Nasopharyngeal Swab     Status: None   Collection Time: 04/28/20 11:42 PM   Specimen: Nasopharyngeal Swab  Result Value Ref Range Status   SARS Coronavirus 2 NEGATIVE NEGATIVE Final    Comment: (NOTE) SARS-CoV-2 target nucleic acids are NOT DETECTED. The SARS-CoV-2 RNA is generally detectable in upper and lower respiratory specimens during the acute phase of infection. The lowest concentration of SARS-CoV-2 viral copies this assay can detect is 250 copies / mL. A negative result does not preclude SARS-CoV-2 infection and should not be used as the sole basis for treatment or other patient management decisions.  A negative result may occur with improper specimen collection / handling, submission of specimen other than nasopharyngeal swab, presence of viral mutation(s) within the areas targeted by this assay, and inadequate number of viral copies (<250 copies / mL). A negative result must be combined with clinical observations, patient history, and epidemiological information. Fact Sheet for Patients:   StrictlyIdeas.no Fact Sheet for Healthcare Providers: BankingDealers.co.za This test is not yet approved or cleared  by the [place] FDA and has been authorized for detection and/or diagnosis of SARS-CoV-2 by FDA under an Emergency Use Authorization (EUA).  This EUA will remain in effect (meaning this test can be used) for the duration of the COVID-19 declaration under Section 564(b)(1) of the Act, 21 U.S.C. section 360bbb-3(b)(1), unless the authorization is terminated or revoked sooner. Performed at [facility], [place] [address], [place], [place] [address]   Blood Culture (routine x 2)     Status: None (Preliminary result)   Collection Time: 04/28/20 11:54 PM   Specimen: BLOOD  Result Value Ref Range Status    Specimen Description BLOOD LEFT ANTECUBITAL  Final   Special Requests   Final    BOTTLES DRAWN AEROBIC AND ANAEROBIC Blood Culture adequate volume   Culture   Final    NO GROWTH < 12 HOURS Performed at [facility], [place] [address], [place], [place] [address]    Report Status PENDING  Incomplete         Radiology Studies: CT Abdomen Pelvis W Contrast  Result Date: 04/28/2020 CLINICAL DATA:  Known cholangiocarcinoma, right upper quadrant pain EXAM: CT ABDOMEN AND PELVIS WITH CONTRAST TECHNIQUE: Multidetector CT imaging of the abdomen and pelvis was performed using the standard protocol following bolus  administration of intravenous contrast. CONTRAST:  135mL OMNIPAQUE IOHEXOL 300 MG/ML  SOLN COMPARISON:  MR abdomen 03/20/2020, CT 03/05/2020 FINDINGS: Lower chest: Bibasilar atelectasis with more dense bandlike areas of opacity in the right lung base likely reflecting subsegmental atelectatic changes well. No visible pulmonary nodules mild cardiomegaly. No pericardial effusion. Distal thoracic aortic atherosclerosis. Hepatobiliary: There are multiple targetoid lesions with central hypoattenuation and peripheral rims of more intermediate attenuation throughout the all segments of the right lobe liver. Some associated capsular retraction is seen along the anterior right lobe (8/11) marked intrahepatic biliary ductal dilatation is noted throughout the right liver as well. This is despite the presence of an internal biliary drain originating in the main hepatic duct in terminating in the duodenum. A prior percutaneous biliary drain no longer continues to the skin surface, terminating in the muscle belly of the superior aspect rectus sheath (8/5). There is a small fluid and gas containing collection measuring approximately 2.1 x 2.3 cm which is contiguous between the muscle belly and hepatic capsule. Catheter seen terminating distally in the common bile duct. Ill-defined hypoattenuation about the  drainage catheters and the central right hepatic duct is noted as well (8/16). Much of the left lobe appears unaffected by these acute changes the gallbladder is circumferentially thickened with adjacent pericholecystic hazy stranding. No visible intraductal gallstones. Pancreas: Unremarkable. No pancreatic ductal dilatation or surrounding inflammatory changes. Spleen: Normal in size without focal abnormality. Adrenals/Urinary Tract: Normal adrenal glands. There is symmetric renal enhancement and slightly delayed excretion bilaterally on more delayed phase imaging. Multiple clustered cysts again seen in the lower pole right kidney including several layering milk of calcium cysts. As well as an additional fluid attenuation cyst in the lower pole left kidney all appear grossly unchanged or diminished in size from the comparison exam. No new concerning renal lesion. No urolithiasis or hydronephrosis. There is circumferential bladder wall thickening. Indentation of the bladder base by the enlarged prostate. Stomach/Bowel: Distal esophagus, stomach and duodenal sweep are unremarkable. No small bowel wall thickening or dilatation. No evidence of obstruction. Air-filled appendix in the right lower quadrant. No colonic dilatation or wall thickening. Vascular/Lymphatic: Atherosclerotic calcifications throughout the abdominal aorta and branch vessels. No aneurysm or ectasia. No enlarged abdominopelvic lymph nodes. Reproductive: Prostatomegaly with enhancing nodule in the periphery near the base of the gland (3/71) indeterminate. Other: Mild circumferential body wall edema. Small volume low-attenuation free fluid in the deep pelvis. No abdominopelvic free air. No bowel containing hernias. Musculoskeletal: Multilevel degenerative changes are present in the imaged portions of the spine. No acute osseous abnormality or suspicious osseous lesion. IMPRESSION: 1. Ill-defined region of hypoattenuation centered upon the central liver  and traversed by the multiple drainage catheters compatible with site of primary cholangiocarcinoma. 2. Multiple targetoid lesions throughout the all segments of the right lobe liver with central hypoattenuation and peripheral rims of more intermediate attenuation. Findings are compatible with metastatic disease. 3. A prior percutaneous biliary drain appears abandoned, no longer continuing to the skin surface, terminating in the muscle belly of the superior aspect of the rectus sheath with a small fluid and air containing collection adjacent the tip contiguous with the superior belly of the rectus muscle and hepatic capsule. Could reflect drainage about catheter tip a an abscess is difficult to exclude. 4. Circumferentially thickened gallbladder wall with adjacent pericholecystic hazy stranding, suspicious for acute cholecystitis. Recommend correlation with right upper quadrant ultrasound. 5. Prostatomegaly with enhancing nodule in the periphery near the base of the gland indeterminate. Recommend  correlation with PSA and consider further evaluation with dedicated prostate imaging. 6. Circumferential bladder wall thickening with faint perivesicular hazy stranding as well as delayed excretion of the kidneys, possibly related to chronic outlet obstruction though should correlate with urinalysis to exclude cystitis and ascending tract infection. 7. Aortic Atherosclerosis (ICD10-I70.0). These results were called by telephone at the time of interpretation on 04/28/2020 at 11:33 pm to provider Fredia Sorrow , who verbally acknowledged these results. Electronically Signed   By: Lovena Le M.D.   On: 04/28/2020 23:34   US Abdomen Limited RUQ  Result Date: 04/29/2020 CLINICAL DATA:  Right upper quadrant pain EXAM: ULTRASOUND ABDOMEN LIMITED RIGHT UPPER QUADRANT COMPARISON:  CT abdomen pelvis 04/28/2020 FINDINGS: Gallbladder: Gallbladder contains hypoechoic biliary sludge. There is mild gallbladder wall thickening of 4  mm. Pericholecystic fluid is noted. Sonographic Percell Miller sign is reportedly positive. Common bile duct: Diameter: 6 mm Liver: There is redemonstration of the extensive intrahepatic biliary ductal dilatation throughout the right lobe liver seen on comparison CT. Corresponding to the several of the more ill-defined hypoattenuating foci seen on CT are more cystic appearing lesions including lesions in the anterior right lobe liver measuring 1.8 x 1.4 x 2.2 cm and 2.3 x 1.8 x 3.2 cm as well as a more complex posterior cystic structure measuring 2.8 x 1.9 x 2.5 cm with low level internal echoes and internal debris. Portal vein is patent on color Doppler imaging with normal direction of blood flow towards the liver. Other: None. IMPRESSION: Sonographic features are suggestive of acute cholecystitis in the appropriate clinical setting. Redemonstration of the extensive intrahepatic biliary ductal dilatation throughout the right lobe liver. With central occlusion presumably from patient's known cholangiocarcinoma. Ill-defined hypoattenuating lesions CT appear to correspond to numerous cystic foci throughout the right liver, new since April of 2021. In the setting of known cholangiocarcinoma, these findings could reflect the development of biloma as extension of the cholangiocarcinoma process. However, given the presence of fever and concern for sepsis, superinfection or formation of de Novo abscesses are not excluded particularly given the more ill-defined margins of these lesions on the CT imaging. Consider further evaluation with contrast-enhanced MRI following patient stabilization. These results were called by telephone at the time of interpretation on 04/29/2020 at 1:03 am to provider Ripley Fraise , who verbally acknowledged these results. Electronically Signed   By: Lovena Le M.D.   On: 04/29/2020 00:58        Scheduled Meds: . levothyroxine  75 mcg Intravenous QAC breakfast   Continuous Infusions: .  sodium chloride 125 mL/hr at 04/29/20 0902  . ceFEPime (MAXIPIME) IV    . dextrose 5 % and 0.9% NaCl 50 mL/hr at 04/29/20 [id number]  . metronidazole 500 mg (04/29/20 0900)     LOS: 0 days    Time spent: 45 minutes     Irine Seal, MD [facility]   To contact the attending provider between 7A-7P or the covering provider during after hours 7P-7A, please log into the web site [email or web address] and access using universal [place] password for that web site. If you do not have the password, please call the hospital operator.  04/29/2020, 9:51 AM

## 2020-04-29 NOTE — Progress Notes (Signed)
Pharmacy Antibiotic Note  Rodney Romero. is a 74 y.o. male admitted on 04/28/2020 with sepsis.  Pharmacy has been consulted for Cefepime dosing.  Plan: Cefepime 2gm IV q12h Will f/u renal function, micro data, and pt's clinical condition     Temp (24hrs), Avg:98.8 F (37.1 C), Min:98.5 F (36.9 C), Max:99.1 F (37.3 C)  Recent Labs  Lab 04/28/20 1608 04/28/20 2334  WBC 12.5*  --   CREATININE 0.82  --   LATICACIDVEN  --  1.3    Estimated Creatinine Clearance: 59.5 mL/min (by C-G formula based on SCr of 0.82 mg/dL).    No Known Allergies  Antimicrobials this admission: 5/27 Cefepime >>  5/27 Flagyl >>   Microbiology results: 5/26 BCx:   Thank you for allowing pharmacy to be a part of this patient's care.  Rodney Romero, PharmD, BCPS Please see amion for complete clinical pharmacist phone list 04/29/2020 2:26 AM

## 2020-04-29 NOTE — ED Provider Notes (Signed)
Assumed care in signout to follow-up on CT imaging and discussed with consultants at [facility].  I discussed the case with oncology physician on-call Dr. Maxie Better at [facility].  We discussed the imaging findings.  Discussed that there is a possible abscess as well as potential cholecystitis.  However patient is currently stable at this time.  Unfortunately there are no beds available at this time at [facility].  In the interim the plan is to admit to the hospitalist service here at [facility].  They can [place] oncology later in the morning.  Interventional radiology may need to be involved for drainage.  IV antibiotics and IV fluids have been started here   Ripley Fraise, MD 04/29/20 0013

## 2020-04-29 NOTE — ED Notes (Signed)
Pt incontinent of stool.  Peric are done and pt bedding changed.  Pt slightly restless but denies any specific needs.

## 2020-04-29 NOTE — ED Notes (Signed)
Pt to IR via stretcher.  First transfusion completed, will wait till patient returns from IR for second unit.

## 2020-04-29 NOTE — Procedures (Signed)
Interventional Radiology Procedure Note  Procedure:  Image guided Int/ext biliary drain of the right biliary system. Image guided percutaneous cholecystostomy.   Findings:  Initial xray imaging shows that what was thought to be retained fragment is in fact dual plastic biliary stents into the left system.   US shows multi-focal fluid collections in the right liver, presumed abscesses communicating with the bile system.   New 83F int/ext biliary drain.   New 8F perc chole drain.   Complications: None  Recommendations:  - Int/ext biliary drain to gravity. Routine drain care - Percutaneous cholecystostomy drain, to gravity.  Routine drain care - Do not submerge - Routine care - If the patient does not improve with the current ABX and drain strategy, we might need to address the liver abscesses.    Signed,  Dulcy Fanny. Earleen Newport, DO

## 2020-04-29 NOTE — ED Provider Notes (Signed)
Ultrasound imaging confirms cholecystitis as well as multiple hepatic lesions.  Patient will likely need further imaging and potentially MRI while in the hospital.  Patient is awake alert in no acute distress.  Vitals appropriate.  Lactate negative.  He is not appear septic. He is already received IV antibiotics. Recommendations for inpatient team is to consult oncology Dr. Maxie Better or Dr. Reine Just at [facility] in the morning as patient would prefer to be [facility].  Discussed with Dr. Hal Hope for admission   Ripley Fraise, MD 04/29/20 [phone]

## 2020-04-29 NOTE — ED Notes (Signed)
Family at bedside. 

## 2020-04-29 NOTE — Progress Notes (Signed)
[place] Progress Note Patient Name: Rodney Romero. DOB: 03-Feb-1946 MRN: [id number]   Date of Service  04/29/2020  HPI/Events of Note  Pt is a 74 yo M with known cholangiocarcinoma admitted with acute cholecystitis, cholangitis and biliary tract sepsis with septic shock, patient also has Klebsiella bacteremia, he is s/p placement of percutaneous biliary drain in IR.  eICU Interventions  New Patient Evaluation completed.        Kerry Kass Jailah Willis 04/29/2020, 11:47 PM

## 2020-04-29 NOTE — ED Notes (Signed)
Paged MD regarding BP

## 2020-04-29 NOTE — ED Provider Notes (Signed)
EKG Interpretation  Date/Time:  Thursday Apr 29 2020 00:10:36 EDT Ventricular Rate:  80 PR Interval:    QRS Duration: 88 QT Interval:  382 QTC Calculation: 441 R Axis:   51 Text Interpretation: Sinus rhythm Borderline low voltage, extremity leads Confirmed by Ripley Fraise [phone]) on 04/29/2020 12:17:25 AM         Ripley Fraise, MD 04/29/20 [phone]

## 2020-04-30 DIAGNOSIS — E039 Hypothyroidism, unspecified: Secondary | ICD-10-CM

## 2020-04-30 LAB — POCT I-STAT 7, (LYTES, BLD GAS, ICA,H+H)
Acid-base deficit: 5 mmol/L — ABNORMAL HIGH (ref 0.0–2.0)
Bicarbonate: 18.9 mmol/L — ABNORMAL LOW (ref 20.0–28.0)
Calcium, Ion: 1.02 mmol/L — ABNORMAL LOW (ref 1.15–1.40)
HCT: 28 % — ABNORMAL LOW (ref 39.0–52.0)
Hemoglobin: 9.5 g/dL — ABNORMAL LOW (ref 13.0–17.0)
O2 Saturation: 99 %
Potassium: 3.9 mmol/L (ref 3.5–5.1)
Sodium: 130 mmol/L — ABNORMAL LOW (ref 135–145)
TCO2: 20 mmol/L — ABNORMAL LOW (ref 22–32)
pCO2 arterial: 28.2 mmHg — ABNORMAL LOW (ref 32.0–48.0)
pH, Arterial: 7.434 (ref 7.350–7.450)
pO2, Arterial: 148 mmHg — ABNORMAL HIGH (ref 83.0–108.0)

## 2020-04-30 LAB — BPAM RBC
Blood Product Expiration Date: 202106242359
Blood Product Expiration Date: 202106242359
ISSUE DATE / TIME: 202105271024
ISSUE DATE / TIME: 202105271739
Unit Type and Rh: 5100
Unit Type and Rh: 5100

## 2020-04-30 LAB — TYPE AND SCREEN
ABO/RH(D): O POS
Antibody Screen: NEGATIVE
Unit division: 0
Unit division: 0

## 2020-04-30 LAB — MRSA PCR SCREENING: MRSA by PCR: NEGATIVE

## 2020-04-30 MED ORDER — SODIUM CHLORIDE 0.9 % IV SOLN: 2.0000 g | INTRAVENOUS | Status: AC

## 2020-04-30 MED ORDER — VANCOMYCIN HCL 500 MG/100ML IV SOLN: 500.0000 mg | Freq: Two times a day (BID) | INTRAVENOUS | Status: AC

## 2020-04-30 MED ORDER — METRONIDAZOLE IN NACL 5-0.79 MG/ML-% IV SOLN: 500.0000 mg | Freq: Three times a day (TID) | INTRAVENOUS | Status: AC

## 2020-04-30 MED ORDER — NOREPINEPHRINE 4 MG/250ML-% IV SOLN: 2.0000 ug/min | INTRAVENOUS | Status: AC

## 2020-04-30 MED ORDER — LEVOTHYROXINE SODIUM 100 MCG/5ML IV SOLN: 75.0000 ug | Freq: Every day | INTRAVENOUS | Status: AC

## 2020-04-30 NOTE — Discharge Summary (Addendum)
Physician Discharge Summary       Patient ID: Rodney Romero. MRN: [id number] DOB/AGE: Aug 29, 1946 74 y.o.  Admit date: 04/28/2020 Discharge date: 04/30/2020  Discharge Diagnoses:  Principal Problem:   Acute cholecystitis Active Problems:   Essential hypertension   Hypothyroidism   Acute anemia   Septic shock [facility])   Hospital Course:   74 year old male with past medical history as below, which is significant for COPD, alcohol abuse, bipolar 1 disorder, hypertension, hypothyroidism, and a recent diagnosis of stage IIIb cholangiocarcinoma.  He was admitted to [facility] on April 16 for acute cholecystitis at which time cholangiocarcinoma was diagnosed.  He receives his oncology care through the [facility] system.  His chemotherapy regimen includes cisplatin and gemcitabine.  He has been deemed not a surgical candidate.  He presents again to [facility] emergency department on 5/26 with complaints of ongoing abdominal pain, nausea, and poor appetite.  Work-up in the emergency department included CT scan of the abdomen which was concerning once again for acute cholecystitis.  He was admitted to the hospitalist service and initiated on empiric antibiotics.  There was an attempt made for transfer to [facility], however, no beds were available at that time.  General surgery was consulted for potential surgical options and have recommended percutaneous drain placement.  He was taken to interventional radiology on 5/27 for drain placement.  Later that evening he became hypotensive.  He was also found to be anemic and transfused 2 units PRBC, however, he remained hypotensive.  PCCM was consulted.    Significant Hospital tests/ studies   CT abd/pelvis 5/26 > Hypoattenuation upon the central liver compatible with cholangiocarcinoma.  Evidence of metastatic disease to the liver.  Abandoned prior percutaneous biliary drain. Circumferentially thickened gallbladder  consistent with acute cholecystitis.  Circumferential bladder wall thickening.  Prostatomegaly with enhancing nodule. Right upper quadrant ultrasound 5/27> Sonographic features suggestive of acute cholecystitis. Extensive intrahepatic biliary ductal dilation.  Consults  IR Surgey   Discharge Exam: BP 94/63   Pulse 96   Temp 99.8 F (37.7 C) (Oral)   Resp (!) 34   SpO2 91%   General: frail elderly male in NAD HENT: [place]/AT, PERRL, no JVD. Mucous membranes dry.  Lungs: Clear Cardiovascular: RRR, no MRG Abdomen: Soft, diffusely tender. Biliary drains.  Extremities: No acute deformity. Very thin.  Neuro: Alert, oriented. Intermittent confusion.   Labs at discharge Lab Results  Component Value Date   CREATININE 0.97 04/29/2020   BUN 16 04/29/2020   NA 132 (L) 04/29/2020   K 3.8 04/29/2020   CL 99 04/29/2020   CO2 24 04/29/2020   Lab Results  Component Value Date   WBC 5.8 04/29/2020   HGB 9.2 (L) 04/29/2020   HCT 27.2 (L) 04/29/2020   MCV 69.0 (L) 04/29/2020   PLT 146 (L) 04/29/2020   Lab Results  Component Value Date   ALT 151 (H) 04/29/2020   AST 189 (H) 04/29/2020   ALKPHOS 442 (H) 04/29/2020   BILITOT 1.7 (H) 04/29/2020   Lab Results  Component Value Date   INR 1.3 (H) 04/28/2020   INR 1.2 02/04/2020   INR 1.3 (H) 02/03/2020     Disposition: Discharge to [facility]. Accepting Physician Dr Mariana Arn     Allergies as of 04/30/2020   No Known Allergies     Medication List    STOP taking these medications   amLODipine 10 MG tablet Commonly known as: NORVASC  aspirin EC 81 MG tablet   fluticasone 50 MCG/ACT nasal spray Commonly known as: FLONASE   levothyroxine 150 MCG tablet Commonly known as: SYNTHROID Replaced by: levothyroxine 100 MCG/5ML Soln injection   loratadine 10 MG tablet Commonly known as: CLARITIN   mirtazapine 15 MG tablet Commonly known as: REMERON   multivitamin with minerals tablet   omeprazole 20 MG  capsule Commonly known as: PRILOSEC   ondansetron 4 MG tablet Commonly known as: ZOFRAN   oxyCODONE 5 MG immediate release tablet Commonly known as: Oxy IR/ROXICODONE     TAKE these medications   cefTRIAXone 2 g in sodium chloride 0.9 % 100 mL Inject 2 g into the vein daily.   levothyroxine 100 MCG/5ML Soln injection Commonly known as: SYNTHROID, LEVOTHROID Inject 3.75 mLs (75 mcg total) into the vein daily before breakfast. Replaces: levothyroxine 150 MCG tablet   metroNIDAZOLE 5-0.79 MG/ML-% IVPB Commonly known as: FLAGYL Inject 100 mLs (500 mg total) into the vein every 8 (eight) hours.   norepinephrine 4-5 MG/250ML-% Soln Commonly known as: LEVOPHED Inject 2-10 mcg/min into the vein continuous.   vancomycin 500 MG/100ML IVPB Commonly known as: VANCOREADY Inject 100 mLs (500 mg total) into the vein every 12 (twelve) hours.        38 minutes of time have been dedicated to discharge assessment, planning and discharge instructions.   Signed:  Georgann Housekeeper, AGACNP-BC [facility] [facility] [place] [facility]  See Amion for personal pager PCCM on call pager [phone]  04/30/2020 12:13 AM

## 2020-04-30 NOTE — Progress Notes (Addendum)
Spoke with Melissa from [facility] and pt has a bed. Room number is 807 located on the 8th floor of cancer center ([facility]).  If transport is not available via [facility], then transport can be set up through them at [phone]  Accepting Bedside RN # [phone] Accepting floor CN # [phone]  All info relayed to pt's current bedside RN, Legrand Como.

## 2020-05-01 LAB — CULTURE, BLOOD (ROUTINE X 2): Special Requests: ADEQUATE

## 2020-05-04 LAB — AEROBIC/ANAEROBIC CULTURE W GRAM STAIN (SURGICAL/DEEP WOUND)

## 2020-05-31 ENCOUNTER — Telehealth (HOSPITAL_COMMUNITY): Payer: Self-pay

## 2020-05-31 NOTE — Telephone Encounter (Signed)
Called pt's wife regarding drain, no answer, left vm. AW

## 2020-09-03 DEATH — deceased

## 2021-10-04 IMAGING — XA IR INT-EXT BILIARY DRAIN W/ CHOLANGIOGRAM
5 of 6 series · 13 of 14 positions shown · non-contrast
Comparison: none

INDICATION: Biliary obstruction. MRCP demonstrates proximal CBD lesion. Failed
ERCP.

[Series 1: fl (-) angio · 1 of 1 slices shown (1 of 5)]
[im 1/1]
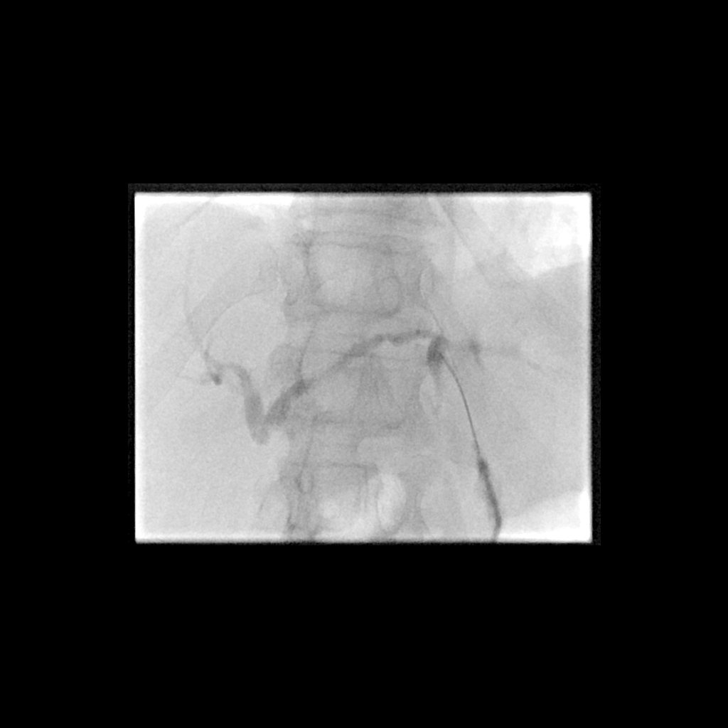

[Series 2: fl (-) angio · 2 acquisitions, 5 frames shown (2 of 5)]
[im 1/2]
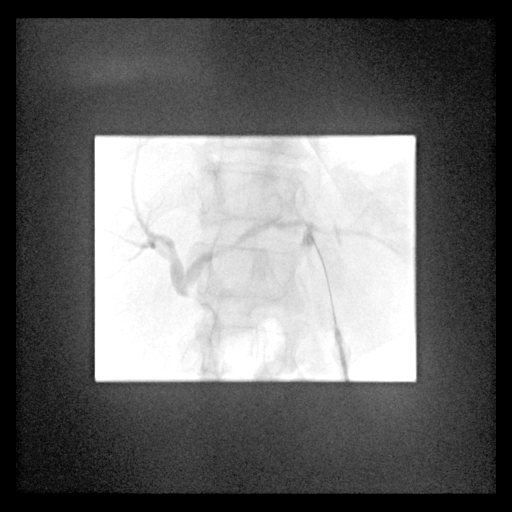
[im 1/2]
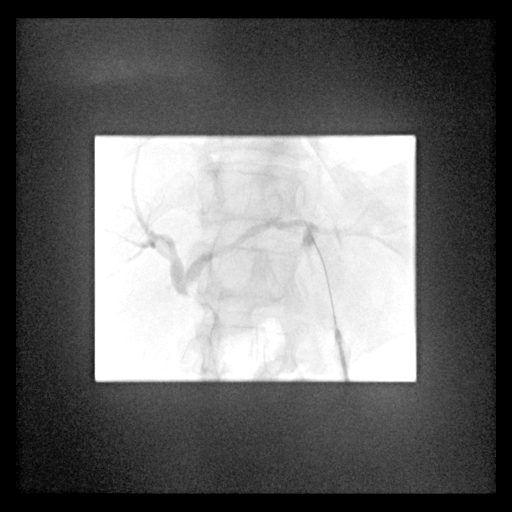
[im 1/2]
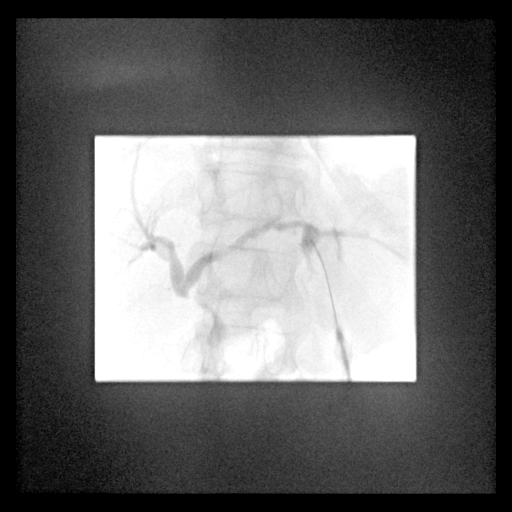
[im 1/2]
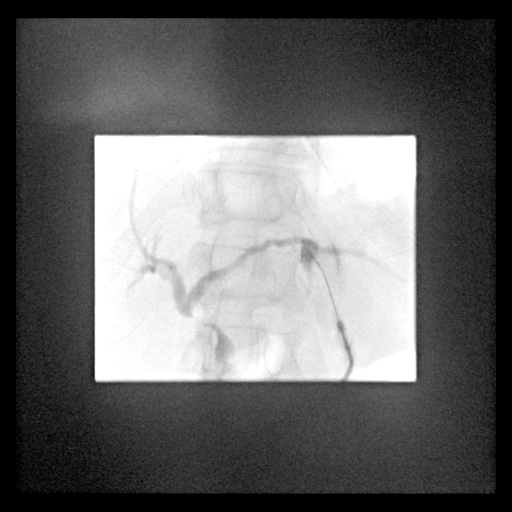
[im 2/2]
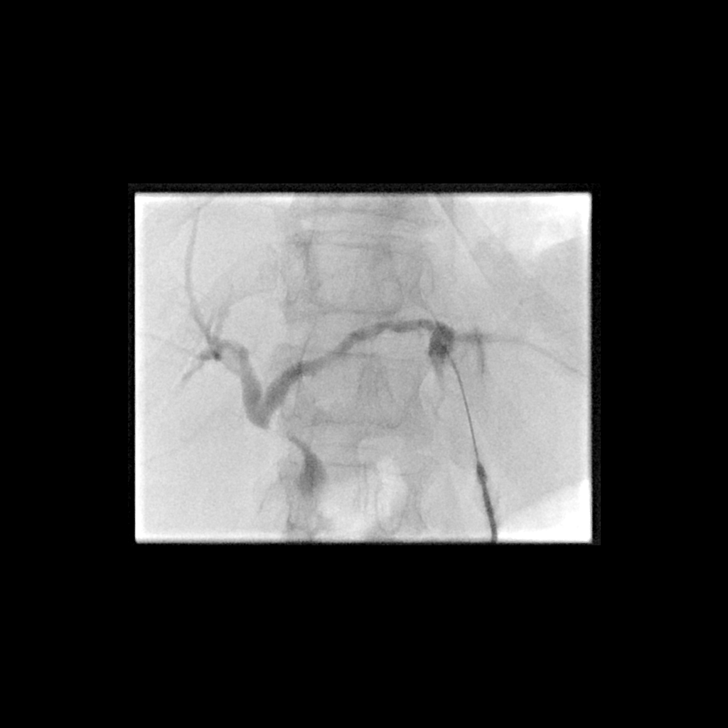

[Series 4: fl (-) angio · 1 of 1 slices shown (3 of 5)]
[im 1/1]
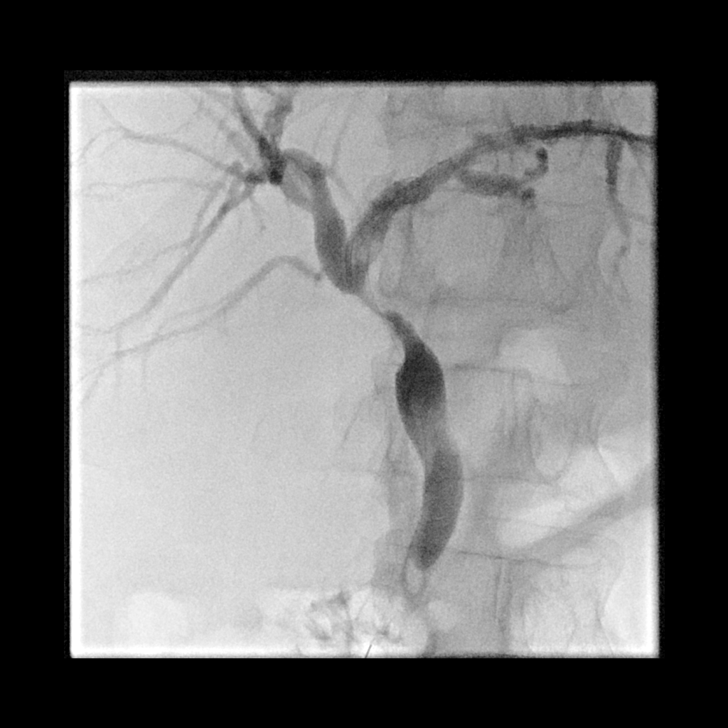

[Series 5: fl (-) angio · 4 of 14 frames shown (4 of 5)]
[frame 1/14]
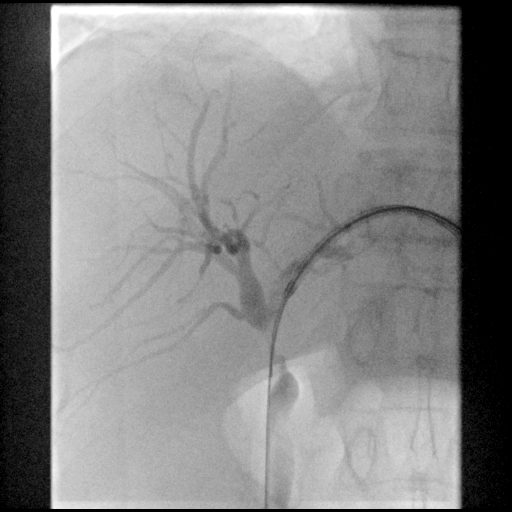
[frame 3/14]
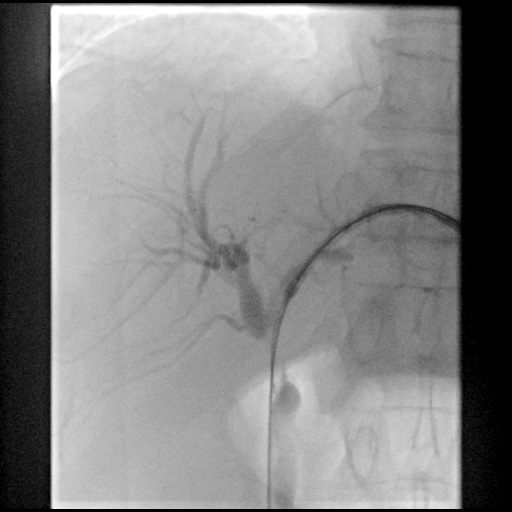
[frame 8/14]
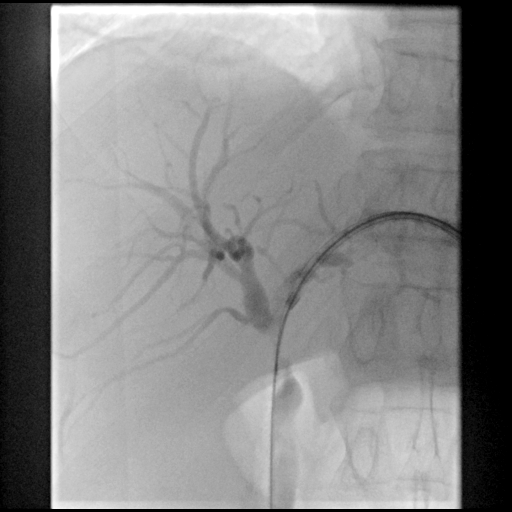
[frame 12/14]
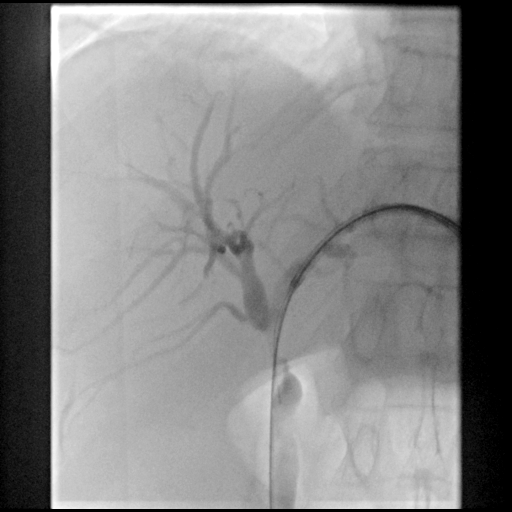

[Series 6: fl (-) angio · 2 of 2 slices shown (5 of 5)]
[im 1/2]
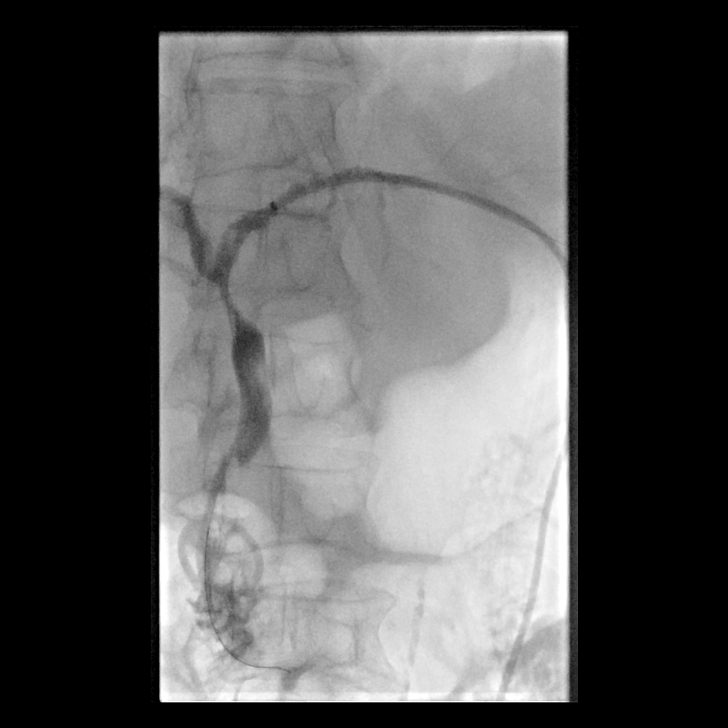
[im 2/2]
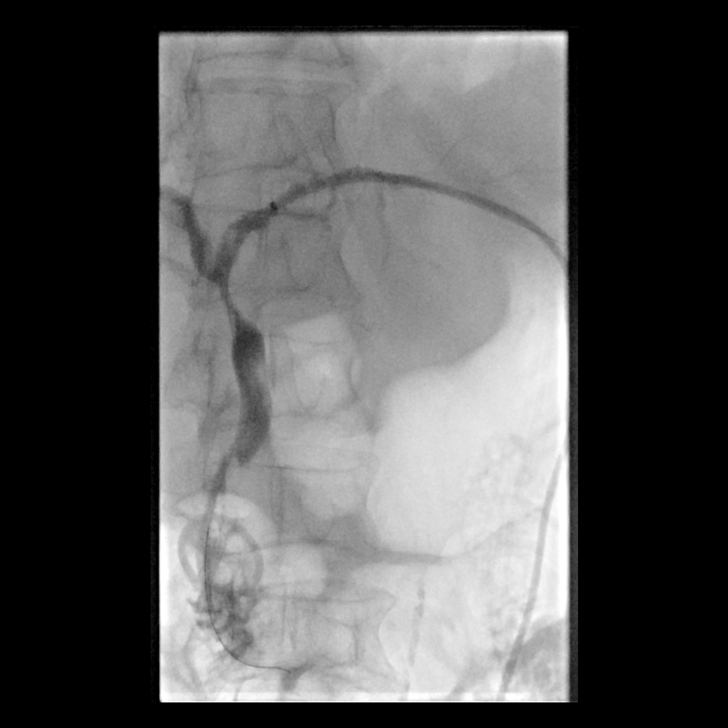

[13 of 14 positions shown; findings below may reference images not displayed]

EXAM:
PERCUTANEOUS TRANSHEPATIC CHOLANGIOGRAM

PERCUTANEOUS INTERNAL/EXTERNAL BILIARY DRAIN CATHETER PLACEMENT

MEDICATIONS:
Patient was already receiving adequate prophylactic antibiotic
coverage as an inpatient.

ANESTHESIA/SEDATION:
Intravenous Fentanyl 910mcg and Versed 2mg were administered as
conscious sedation during continuous monitoring of the patient's
level of consciousness and physiological / cardiorespiratory status
by the radiology RN, with a total moderate sedation time of 38
minutes.

PROCEDURE:
Informed written consent was obtained from the patient after a
thorough discussion of the procedural risks, benefits and
alternatives. All questions were addressed. Maximal Sterile Barrier
Technique was utilized including caps, mask, sterile gowns, sterile
gloves, sterile drape, hand hygiene and skin antiseptic. A timeout
was performed prior to the initiation of the procedure.

Dilated intrahepatic biliary tree was identified on ultrasound and a
peripheral left lobe duct was selected for approach. Skin and
subcutaneous tissues down to the liver capsule infiltrated with 1%
lidocaine. Under real-time ultrasound guidance, a 21 gauge
micropuncture needle was advanced into the duct. Small contrast
injection under fluoroscopy confirmed appropriate positioning. A 018
guidewire would not advance. For this reason, percutaneous
cholangiogram was performed through the needle. Proximal CBD
high-grade stenosis was confirmed. A peripheral left bile duct was
identified in from a more advantageous angle, 21 gauge trocar needle
advanced into the duct. 018 guidewire advanced centrally. A 5 French
micropuncture dilator was placed. Catheter exchanged over an angled
Glidewire for a 5 French Kumpe catheter, advanced to the distal CBD
for additional cholangiographic images. The Kumpe the was then
advanced into the distal duodenum and exchanged over an Amplatz wire
for vascular dilator which facilitated placement of a 7 French long
vascular sheath. Through this, brush biopsy x2 the proximal CBD
lesion was obtained.

Subsequently, the sheath was exchanged over Amplatz wire for
French internal/external biliary drain catheter, placed with
sideholes spanning the CBD lesion, and holes in the distal duodenum.
Confirmatory contrast injection under fluoroscopy performed. The
catheter was then flushed with saline and secured externally with 0
Prolene suture and StatLock, placed to external drain bag to
maximize biliary decompression. The patient tolerated the procedure
well.

FLUOROSCOPY TIME:  ***

COMPLICATIONS:
None immediate.
FINDINGS: The percutaneous transhepatic cholangiogram confirms mildly dilated
intrahepatic biliary tree with a tapered somewhat irregular
short-segment narrowing in the proximal common duct just distal to
the confluence. Brush biopsies of the stenotic region were obtained
x2. 8.5 French internal/external biliary drainage catheter placed as
above.
IMPRESSION: 1. Proximal CBD obstructing lesion with intrahepatic biliary ductal
dilatation.
2. Brush biopsy of proximal CBD lesion.
3. Technically successful internal/external biliary drain catheter
placement.
